# Patient Record
Sex: Female | Born: 1940 | Race: Black or African American | Hispanic: No | Smoking: Never smoker
Health system: Southern US, Community
[De-identification: ages and names within clinical notes are randomized; demographics above are authoritative.]

## PROBLEM LIST (undated history)

## (undated) DIAGNOSIS — F0280 Dementia in other diseases classified elsewhere without behavioral disturbance: Secondary | ICD-10-CM

## (undated) DIAGNOSIS — G4733 Obstructive sleep apnea (adult) (pediatric): Secondary | ICD-10-CM

## (undated) DIAGNOSIS — N950 Postmenopausal bleeding: Secondary | ICD-10-CM

## (undated) DIAGNOSIS — R32 Unspecified urinary incontinence: Secondary | ICD-10-CM

## (undated) DIAGNOSIS — I10 Essential (primary) hypertension: Secondary | ICD-10-CM

## (undated) DIAGNOSIS — H409 Unspecified glaucoma: Secondary | ICD-10-CM

## (undated) DIAGNOSIS — K219 Gastro-esophageal reflux disease without esophagitis: Secondary | ICD-10-CM

## (undated) DIAGNOSIS — R279 Unspecified lack of coordination: Secondary | ICD-10-CM

## (undated) DIAGNOSIS — I2510 Atherosclerotic heart disease of native coronary artery without angina pectoris: Secondary | ICD-10-CM

## (undated) DIAGNOSIS — I219 Acute myocardial infarction, unspecified: Secondary | ICD-10-CM

## (undated) DIAGNOSIS — F068 Other specified mental disorders due to known physiological condition: Secondary | ICD-10-CM

## (undated) DIAGNOSIS — M109 Gout, unspecified: Secondary | ICD-10-CM

## (undated) DIAGNOSIS — I729 Aneurysm of unspecified site: Secondary | ICD-10-CM

## (undated) DIAGNOSIS — G309 Alzheimer's disease, unspecified: Secondary | ICD-10-CM

## (undated) DIAGNOSIS — R609 Edema, unspecified: Secondary | ICD-10-CM

## (undated) DIAGNOSIS — E669 Obesity, unspecified: Secondary | ICD-10-CM

## (undated) DIAGNOSIS — I38 Endocarditis, valve unspecified: Secondary | ICD-10-CM

## (undated) DIAGNOSIS — I252 Old myocardial infarction: Secondary | ICD-10-CM

## (undated) DIAGNOSIS — I6350 Cerebral infarction due to unspecified occlusion or stenosis of unspecified cerebral artery: Secondary | ICD-10-CM

## (undated) DIAGNOSIS — E785 Hyperlipidemia, unspecified: Secondary | ICD-10-CM

## (undated) DIAGNOSIS — M1710 Unilateral primary osteoarthritis, unspecified knee: Secondary | ICD-10-CM

## (undated) HISTORY — PX: TUBAL LIGATION: SHX77

## (undated) HISTORY — DX: Atherosclerotic heart disease of native coronary artery without angina pectoris: I25.10

## (undated) HISTORY — DX: Unilateral primary osteoarthritis, unspecified knee: M17.10

## (undated) HISTORY — DX: Unspecified lack of coordination: R27.9

## (undated) HISTORY — DX: Unspecified urinary incontinence: R32

## (undated) HISTORY — DX: Dementia in other diseases classified elsewhere, unspecified severity, without behavioral disturbance, psychotic disturbance, mood disturbance, and anxiety: F02.80

## (undated) HISTORY — DX: Essential (primary) hypertension: I10

## (undated) HISTORY — DX: Acute myocardial infarction, unspecified: I21.9

## (undated) HISTORY — DX: Gout, unspecified: M10.9

## (undated) HISTORY — DX: Hyperlipidemia, unspecified: E78.5

## (undated) HISTORY — DX: Aneurysm of unspecified site: I72.9

## (undated) HISTORY — DX: Other specified mental disorders due to known physiological condition: F06.8

## (undated) HISTORY — DX: Alzheimer's disease, unspecified: G30.9

## (undated) HISTORY — DX: Morbid (severe) obesity due to excess calories: E66.01

## (undated) HISTORY — DX: Obstructive sleep apnea (adult) (pediatric): G47.33

## (undated) HISTORY — DX: Gastro-esophageal reflux disease without esophagitis: K21.9

## (undated) HISTORY — DX: Unspecified glaucoma: H40.9

## (undated) HISTORY — DX: Cerebral infarction due to unspecified occlusion or stenosis of unspecified cerebral artery: I63.50

## (undated) HISTORY — DX: Obesity, unspecified: E66.9

## (undated) HISTORY — DX: Edema, unspecified: R60.9

## (undated) HISTORY — DX: Endocarditis, valve unspecified: I38

## (undated) HISTORY — DX: Postmenopausal bleeding: N95.0

## (undated) HISTORY — DX: Old myocardial infarction: I25.2

---

## 1998-03-27 ENCOUNTER — Encounter: Admission: RE | Admit: 1998-03-27 | Discharge: 1998-06-25 | Payer: Self-pay | Admitting: Family Medicine

## 1999-08-20 ENCOUNTER — Ambulatory Visit (HOSPITAL_COMMUNITY): Admission: RE | Admit: 1999-08-20 | Discharge: 1999-08-20 | Payer: Self-pay | Admitting: Cardiology

## 1999-08-21 ENCOUNTER — Encounter: Payer: Self-pay | Admitting: Cardiology

## 2000-04-09 ENCOUNTER — Ambulatory Visit (HOSPITAL_COMMUNITY): Admission: RE | Admit: 2000-04-09 | Discharge: 2000-04-09 | Payer: Self-pay | Admitting: Family Medicine

## 2000-04-09 ENCOUNTER — Encounter: Payer: Self-pay | Admitting: Family Medicine

## 2001-01-03 ENCOUNTER — Other Ambulatory Visit: Admission: RE | Admit: 2001-01-03 | Discharge: 2001-01-03 | Payer: Self-pay | Admitting: *Deleted

## 2001-04-05 ENCOUNTER — Encounter: Admission: RE | Admit: 2001-04-05 | Discharge: 2001-04-05 | Payer: Self-pay | Admitting: Obstetrics & Gynecology

## 2001-04-05 ENCOUNTER — Other Ambulatory Visit: Admission: RE | Admit: 2001-04-05 | Discharge: 2001-04-05 | Payer: Self-pay | Admitting: Obstetrics & Gynecology

## 2001-05-03 ENCOUNTER — Other Ambulatory Visit: Admission: RE | Admit: 2001-05-03 | Discharge: 2001-05-03 | Payer: Self-pay | Admitting: *Deleted

## 2001-05-03 ENCOUNTER — Encounter: Admission: RE | Admit: 2001-05-03 | Discharge: 2001-05-03 | Payer: Self-pay | Admitting: Obstetrics & Gynecology

## 2001-06-28 ENCOUNTER — Encounter: Admission: RE | Admit: 2001-06-28 | Discharge: 2001-06-28 | Payer: Self-pay | Admitting: Obstetrics & Gynecology

## 2001-12-26 ENCOUNTER — Emergency Department (HOSPITAL_COMMUNITY): Admission: EM | Admit: 2001-12-26 | Discharge: 2001-12-26 | Payer: Self-pay | Admitting: *Deleted

## 2002-01-01 ENCOUNTER — Emergency Department (HOSPITAL_COMMUNITY): Admission: EM | Admit: 2002-01-01 | Discharge: 2002-01-01 | Payer: Self-pay | Admitting: Emergency Medicine

## 2002-01-12 ENCOUNTER — Emergency Department (HOSPITAL_COMMUNITY): Admission: EM | Admit: 2002-01-12 | Discharge: 2002-01-12 | Payer: Self-pay | Admitting: Emergency Medicine

## 2002-01-20 ENCOUNTER — Encounter: Payer: Self-pay | Admitting: Emergency Medicine

## 2002-01-20 ENCOUNTER — Emergency Department (HOSPITAL_COMMUNITY): Admission: EM | Admit: 2002-01-20 | Discharge: 2002-01-20 | Payer: Self-pay | Admitting: Emergency Medicine

## 2002-01-20 ENCOUNTER — Inpatient Hospital Stay (HOSPITAL_COMMUNITY): Admission: AD | Admit: 2002-01-20 | Discharge: 2002-01-25 | Payer: Self-pay | Admitting: Neurology

## 2002-02-08 ENCOUNTER — Emergency Department (HOSPITAL_COMMUNITY): Admission: EM | Admit: 2002-02-08 | Discharge: 2002-02-09 | Payer: Self-pay | Admitting: Emergency Medicine

## 2002-02-09 ENCOUNTER — Encounter: Payer: Self-pay | Admitting: *Deleted

## 2002-03-01 ENCOUNTER — Ambulatory Visit (HOSPITAL_COMMUNITY): Admission: RE | Admit: 2002-03-01 | Discharge: 2002-03-01 | Payer: Self-pay | Admitting: Neurology

## 2002-03-01 ENCOUNTER — Encounter: Payer: Self-pay | Admitting: Neurology

## 2002-08-24 ENCOUNTER — Encounter: Admission: RE | Admit: 2002-08-24 | Discharge: 2002-08-24 | Payer: Self-pay | Admitting: Internal Medicine

## 2002-10-02 ENCOUNTER — Other Ambulatory Visit: Admission: RE | Admit: 2002-10-02 | Discharge: 2002-10-02 | Payer: Self-pay | Admitting: Internal Medicine

## 2002-10-02 ENCOUNTER — Encounter: Admission: RE | Admit: 2002-10-02 | Discharge: 2002-10-02 | Payer: Self-pay | Admitting: Internal Medicine

## 2002-10-20 ENCOUNTER — Encounter: Admission: RE | Admit: 2002-10-20 | Discharge: 2002-10-20 | Payer: Self-pay | Admitting: Internal Medicine

## 2002-10-27 ENCOUNTER — Encounter: Admission: RE | Admit: 2002-10-27 | Discharge: 2002-10-27 | Payer: Self-pay | Admitting: Internal Medicine

## 2002-10-27 ENCOUNTER — Encounter: Payer: Self-pay | Admitting: Internal Medicine

## 2002-10-27 ENCOUNTER — Inpatient Hospital Stay (HOSPITAL_COMMUNITY): Admission: EM | Admit: 2002-10-27 | Discharge: 2002-10-28 | Payer: Self-pay | Admitting: Emergency Medicine

## 2002-10-27 ENCOUNTER — Ambulatory Visit (HOSPITAL_COMMUNITY): Admission: RE | Admit: 2002-10-27 | Discharge: 2002-10-27 | Payer: Self-pay | Admitting: Internal Medicine

## 2002-10-27 ENCOUNTER — Encounter: Payer: Self-pay | Admitting: Emergency Medicine

## 2002-11-14 ENCOUNTER — Encounter: Admission: RE | Admit: 2002-11-14 | Discharge: 2002-11-14 | Payer: Self-pay | Admitting: Internal Medicine

## 2002-12-17 ENCOUNTER — Emergency Department (HOSPITAL_COMMUNITY): Admission: EM | Admit: 2002-12-17 | Discharge: 2002-12-17 | Payer: Self-pay | Admitting: Emergency Medicine

## 2002-12-17 ENCOUNTER — Encounter: Payer: Self-pay | Admitting: Emergency Medicine

## 2003-03-02 ENCOUNTER — Encounter: Admission: RE | Admit: 2003-03-02 | Discharge: 2003-03-02 | Payer: Self-pay | Admitting: Internal Medicine

## 2003-04-19 ENCOUNTER — Emergency Department (HOSPITAL_COMMUNITY): Admission: EM | Admit: 2003-04-19 | Discharge: 2003-04-19 | Payer: Self-pay | Admitting: Emergency Medicine

## 2003-07-05 ENCOUNTER — Encounter: Admission: RE | Admit: 2003-07-05 | Discharge: 2003-07-05 | Payer: Self-pay | Admitting: Internal Medicine

## 2003-10-02 ENCOUNTER — Encounter: Admission: RE | Admit: 2003-10-02 | Discharge: 2003-10-02 | Payer: Self-pay | Admitting: Internal Medicine

## 2003-10-16 ENCOUNTER — Encounter: Admission: RE | Admit: 2003-10-16 | Discharge: 2003-10-16 | Payer: Self-pay | Admitting: Internal Medicine

## 2004-03-24 ENCOUNTER — Encounter: Admission: RE | Admit: 2004-03-24 | Discharge: 2004-03-24 | Payer: Self-pay | Admitting: Internal Medicine

## 2004-05-26 ENCOUNTER — Encounter: Admission: RE | Admit: 2004-05-26 | Discharge: 2004-05-26 | Payer: Self-pay | Admitting: Internal Medicine

## 2004-07-24 ENCOUNTER — Encounter: Admission: RE | Admit: 2004-07-24 | Discharge: 2004-07-24 | Payer: Self-pay | Admitting: Internal Medicine

## 2004-07-24 ENCOUNTER — Encounter (INDEPENDENT_AMBULATORY_CARE_PROVIDER_SITE_OTHER): Payer: Self-pay | Admitting: *Deleted

## 2004-07-31 ENCOUNTER — Encounter: Admission: RE | Admit: 2004-07-31 | Discharge: 2004-07-31 | Payer: Self-pay | Admitting: Internal Medicine

## 2004-08-07 ENCOUNTER — Ambulatory Visit: Payer: Self-pay | Admitting: Internal Medicine

## 2004-09-10 ENCOUNTER — Ambulatory Visit: Payer: Self-pay | Admitting: Internal Medicine

## 2004-09-17 ENCOUNTER — Ambulatory Visit: Payer: Self-pay | Admitting: Internal Medicine

## 2004-10-14 ENCOUNTER — Ambulatory Visit: Payer: Self-pay | Admitting: Internal Medicine

## 2004-11-04 ENCOUNTER — Ambulatory Visit: Payer: Self-pay | Admitting: Internal Medicine

## 2004-11-06 ENCOUNTER — Ambulatory Visit (HOSPITAL_COMMUNITY): Admission: RE | Admit: 2004-11-06 | Discharge: 2004-11-06 | Payer: Self-pay | Admitting: Internal Medicine

## 2004-11-11 ENCOUNTER — Ambulatory Visit: Payer: Self-pay | Admitting: Internal Medicine

## 2004-12-15 ENCOUNTER — Ambulatory Visit: Payer: Self-pay | Admitting: Internal Medicine

## 2005-02-23 ENCOUNTER — Ambulatory Visit: Payer: Self-pay | Admitting: Internal Medicine

## 2005-03-04 ENCOUNTER — Emergency Department (HOSPITAL_COMMUNITY): Admission: EM | Admit: 2005-03-04 | Discharge: 2005-03-04 | Payer: Self-pay | Admitting: Emergency Medicine

## 2005-03-05 ENCOUNTER — Ambulatory Visit: Payer: Self-pay | Admitting: Internal Medicine

## 2005-03-31 ENCOUNTER — Emergency Department (HOSPITAL_COMMUNITY): Admission: EM | Admit: 2005-03-31 | Discharge: 2005-03-31 | Payer: Self-pay | Admitting: Family Medicine

## 2005-04-01 ENCOUNTER — Emergency Department (HOSPITAL_COMMUNITY): Admission: EM | Admit: 2005-04-01 | Discharge: 2005-04-01 | Payer: Self-pay | Admitting: Emergency Medicine

## 2005-04-03 ENCOUNTER — Ambulatory Visit: Payer: Self-pay | Admitting: Internal Medicine

## 2005-04-20 ENCOUNTER — Ambulatory Visit: Payer: Self-pay | Admitting: Internal Medicine

## 2005-05-05 ENCOUNTER — Ambulatory Visit: Payer: Self-pay | Admitting: Internal Medicine

## 2005-05-12 ENCOUNTER — Ambulatory Visit: Payer: Self-pay | Admitting: Internal Medicine

## 2005-05-26 ENCOUNTER — Ambulatory Visit (HOSPITAL_COMMUNITY): Admission: RE | Admit: 2005-05-26 | Discharge: 2005-05-26 | Payer: Self-pay | Admitting: Internal Medicine

## 2005-05-26 ENCOUNTER — Encounter (INDEPENDENT_AMBULATORY_CARE_PROVIDER_SITE_OTHER): Payer: Self-pay | Admitting: Cardiology

## 2005-10-01 ENCOUNTER — Ambulatory Visit: Payer: Self-pay | Admitting: Internal Medicine

## 2005-10-01 ENCOUNTER — Encounter (INDEPENDENT_AMBULATORY_CARE_PROVIDER_SITE_OTHER): Payer: Self-pay | Admitting: *Deleted

## 2005-10-15 ENCOUNTER — Ambulatory Visit: Payer: Self-pay | Admitting: Internal Medicine

## 2005-10-28 ENCOUNTER — Ambulatory Visit: Payer: Self-pay | Admitting: Internal Medicine

## 2005-11-05 ENCOUNTER — Ambulatory Visit: Payer: Self-pay | Admitting: Internal Medicine

## 2006-01-22 ENCOUNTER — Ambulatory Visit: Payer: Self-pay | Admitting: Internal Medicine

## 2006-04-12 ENCOUNTER — Ambulatory Visit: Payer: Self-pay | Admitting: Internal Medicine

## 2006-05-04 ENCOUNTER — Ambulatory Visit: Payer: Self-pay | Admitting: Internal Medicine

## 2006-05-24 ENCOUNTER — Ambulatory Visit: Payer: Self-pay | Admitting: Internal Medicine

## 2006-07-09 ENCOUNTER — Ambulatory Visit: Payer: Self-pay | Admitting: Internal Medicine

## 2006-08-10 ENCOUNTER — Ambulatory Visit: Payer: Self-pay | Admitting: Internal Medicine

## 2006-09-23 ENCOUNTER — Ambulatory Visit: Payer: Self-pay | Admitting: Internal Medicine

## 2006-10-26 ENCOUNTER — Ambulatory Visit (HOSPITAL_BASED_OUTPATIENT_CLINIC_OR_DEPARTMENT_OTHER): Admission: RE | Admit: 2006-10-26 | Discharge: 2006-10-26 | Payer: Self-pay | Admitting: Internal Medicine

## 2006-10-30 ENCOUNTER — Ambulatory Visit: Payer: Self-pay | Admitting: Pulmonary Disease

## 2006-11-16 ENCOUNTER — Ambulatory Visit: Payer: Self-pay | Admitting: Internal Medicine

## 2006-12-03 ENCOUNTER — Ambulatory Visit: Payer: Self-pay | Admitting: Internal Medicine

## 2006-12-22 ENCOUNTER — Ambulatory Visit: Payer: Self-pay | Admitting: Internal Medicine

## 2007-01-12 ENCOUNTER — Ambulatory Visit: Payer: Self-pay | Admitting: Internal Medicine

## 2007-02-14 ENCOUNTER — Ambulatory Visit: Payer: Self-pay | Admitting: Internal Medicine

## 2007-02-14 DIAGNOSIS — E785 Hyperlipidemia, unspecified: Secondary | ICD-10-CM

## 2007-02-14 DIAGNOSIS — I252 Old myocardial infarction: Secondary | ICD-10-CM

## 2007-02-14 DIAGNOSIS — I10 Essential (primary) hypertension: Secondary | ICD-10-CM

## 2007-02-14 DIAGNOSIS — I2510 Atherosclerotic heart disease of native coronary artery without angina pectoris: Secondary | ICD-10-CM

## 2007-02-14 HISTORY — DX: Atherosclerotic heart disease of native coronary artery without angina pectoris: I25.10

## 2007-02-14 HISTORY — DX: Old myocardial infarction: I25.2

## 2007-02-14 HISTORY — DX: Hyperlipidemia, unspecified: E78.5

## 2007-02-14 HISTORY — DX: Essential (primary) hypertension: I10

## 2007-02-14 LAB — CONVERTED CEMR LAB
ALT: 15 units/L (ref 0–40)
AST: 19 units/L (ref 0–37)
Albumin: 3.7 g/dL (ref 3.5–5.2)
Alkaline Phosphatase: 74 units/L (ref 39–117)
BUN: 6 mg/dL (ref 6–23)
Bilirubin, Direct: 0.1 mg/dL (ref 0.0–0.3)
CO2: 34 meq/L — ABNORMAL HIGH (ref 19–32)
Calcium: 9.3 mg/dL (ref 8.4–10.5)
Chloride: 101 meq/L (ref 96–112)
Cholesterol: 240 mg/dL (ref 0–200)
Creatinine, Ser: 0.9 mg/dL (ref 0.4–1.2)
Direct LDL: 177.5 mg/dL
GFR calc Af Amer: 81 mL/min
GFR calc non Af Amer: 67 mL/min
Glucose, Bld: 95 mg/dL (ref 70–99)
HDL: 46.2 mg/dL (ref >39.0)
Potassium: 4.1 meq/L (ref 3.5–5.1)
Sodium: 144 meq/L (ref 135–145)
Total Bilirubin: 0.7 mg/dL (ref 0.3–1.2)
Total CHOL/HDL Ratio: 5.2
Total Protein: 7.4 g/dL (ref 6.0–8.3)
Triglycerides: 80 mg/dL (ref 0–149)
VLDL: 16 mg/dL (ref 0–40)

## 2007-02-22 ENCOUNTER — Ambulatory Visit: Payer: Self-pay | Admitting: Internal Medicine

## 2007-03-14 ENCOUNTER — Ambulatory Visit: Payer: Self-pay | Admitting: Internal Medicine

## 2007-03-17 ENCOUNTER — Emergency Department (HOSPITAL_COMMUNITY): Admission: EM | Admit: 2007-03-17 | Discharge: 2007-03-18 | Payer: Self-pay | Admitting: Emergency Medicine

## 2007-03-29 ENCOUNTER — Ambulatory Visit: Payer: Self-pay | Admitting: Internal Medicine

## 2007-03-29 LAB — CONVERTED CEMR LAB
ALT: 13 units/L (ref 0–40)
AST: 15 units/L (ref 0–37)
Albumin: 3.6 g/dL (ref 3.5–5.2)
Alkaline Phosphatase: 67 units/L (ref 39–117)
Bilirubin, Direct: 0.1 mg/dL (ref 0.0–0.3)
Cholesterol: 189 mg/dL (ref 0–200)
HDL: 40.3 mg/dL (ref >39.0)
LDL Cholesterol: 118 mg/dL — ABNORMAL HIGH (ref 0–99)
Total Bilirubin: 0.7 mg/dL (ref 0.3–1.2)
Total CHOL/HDL Ratio: 4.7
Total Protein: 6.7 g/dL (ref 6.0–8.3)
Triglycerides: 153 mg/dL — ABNORMAL HIGH (ref 0–149)
VLDL: 31 mg/dL (ref 0–40)

## 2007-04-28 ENCOUNTER — Ambulatory Visit: Payer: Self-pay | Admitting: Internal Medicine

## 2007-04-28 LAB — CONVERTED CEMR LAB
ALT: 15 units/L (ref 0–40)
AST: 17 units/L (ref 0–37)
Albumin: 3.5 g/dL (ref 3.5–5.2)
Alkaline Phosphatase: 69 units/L (ref 39–117)
BUN: 15 mg/dL (ref 6–23)
Bilirubin, Direct: 0.1 mg/dL (ref 0.0–0.3)
CO2: 33 meq/L — ABNORMAL HIGH (ref 19–32)
Calcium: 9.4 mg/dL (ref 8.4–10.5)
Chloride: 107 meq/L (ref 96–112)
Cholesterol: 204 mg/dL (ref 0–200)
Creatinine, Ser: 1 mg/dL (ref 0.4–1.2)
Direct LDL: 139.6 mg/dL
GFR calc Af Amer: 72 mL/min
GFR calc non Af Amer: 59 mL/min
Glucose, Bld: 95 mg/dL (ref 70–99)
HDL: 41.5 mg/dL (ref >39.0)
Potassium: 3.7 meq/L (ref 3.5–5.1)
Sodium: 146 meq/L — ABNORMAL HIGH (ref 135–145)
Total Bilirubin: 0.5 mg/dL (ref 0.3–1.2)
Total CHOL/HDL Ratio: 4.9
Total Protein: 7.2 g/dL (ref 6.0–8.3)
Triglycerides: 101 mg/dL (ref 0–149)
VLDL: 20 mg/dL (ref 0–40)

## 2007-05-18 ENCOUNTER — Encounter: Admission: RE | Admit: 2007-05-18 | Discharge: 2007-08-16 | Payer: Self-pay | Admitting: Internal Medicine

## 2007-05-26 ENCOUNTER — Ambulatory Visit: Payer: Self-pay | Admitting: Internal Medicine

## 2007-05-27 DIAGNOSIS — G4733 Obstructive sleep apnea (adult) (pediatric): Secondary | ICD-10-CM

## 2007-05-27 HISTORY — DX: Obstructive sleep apnea (adult) (pediatric): G47.33

## 2007-07-07 ENCOUNTER — Ambulatory Visit: Payer: Self-pay | Admitting: Internal Medicine

## 2007-07-07 LAB — CONVERTED CEMR LAB
Cholesterol, target level: 200 mg/dL
HDL goal, serum: 40 mg/dL
LDL Goal: 100 mg/dL

## 2007-07-08 LAB — CONVERTED CEMR LAB
ALT: 16 units/L (ref 0–35)
AST: 17 units/L (ref 0–37)
Albumin: 3.7 g/dL (ref 3.5–5.2)
Alkaline Phosphatase: 71 units/L (ref 39–117)
BUN: 9 mg/dL (ref 6–23)
Basophils Absolute: 0.1 10*3/uL (ref 0.0–0.1)
Basophils Relative: 0.9 % (ref 0.0–1.0)
Bilirubin, Direct: 0.1 mg/dL (ref 0.0–0.3)
CO2: 32 meq/L (ref 19–32)
Calcium: 9.6 mg/dL (ref 8.4–10.5)
Chloride: 108 meq/L (ref 96–112)
Cholesterol: 162 mg/dL (ref 0–200)
Creatinine, Ser: 0.9 mg/dL (ref 0.4–1.2)
Eosinophils Absolute: 0.2 10*3/uL (ref 0.0–0.6)
Eosinophils Relative: 3.3 % (ref 0.0–5.0)
GFR calc Af Amer: 81 mL/min
GFR calc non Af Amer: 67 mL/min
Glucose, Bld: 115 mg/dL — ABNORMAL HIGH (ref 70–99)
HCT: 39 % (ref 36.0–46.0)
HDL: 36.7 mg/dL — ABNORMAL LOW (ref >39.0)
Hemoglobin: 13.6 g/dL (ref 12.0–15.0)
LDL Cholesterol: 113 mg/dL — ABNORMAL HIGH (ref 0–99)
Lymphocytes Relative: 34 % (ref 12.0–46.0)
MCHC: 34.7 g/dL (ref 30.0–36.0)
MCV: 85.1 fL (ref 78.0–100.0)
Monocytes Absolute: 0.5 10*3/uL (ref 0.2–0.7)
Monocytes Relative: 8 % (ref 3.0–11.0)
Neutro Abs: 3.1 10*3/uL (ref 1.4–7.7)
Neutrophils Relative %: 53.8 % (ref 43.0–77.0)
Platelets: 217 10*3/uL (ref 150–400)
Potassium: 4.1 meq/L (ref 3.5–5.1)
RBC: 4.59 M/uL (ref 3.87–5.11)
RDW: 14 % (ref 11.5–14.6)
Sodium: 146 meq/L — ABNORMAL HIGH (ref 135–145)
Total Bilirubin: 0.5 mg/dL (ref 0.3–1.2)
Total CHOL/HDL Ratio: 4.4
Total Protein: 7 g/dL (ref 6.0–8.3)
Triglycerides: 63 mg/dL (ref 0–149)
VLDL: 13 mg/dL (ref 0–40)
WBC: 5.9 10*3/uL (ref 4.5–10.5)

## 2007-08-11 ENCOUNTER — Ambulatory Visit: Payer: Self-pay | Admitting: Internal Medicine

## 2007-08-25 ENCOUNTER — Encounter: Admission: RE | Admit: 2007-08-25 | Discharge: 2007-08-25 | Payer: Self-pay | Admitting: Ophthalmology

## 2007-09-09 ENCOUNTER — Encounter: Payer: Self-pay | Admitting: Internal Medicine

## 2007-09-23 ENCOUNTER — Ambulatory Visit: Payer: Self-pay | Admitting: Internal Medicine

## 2007-09-26 LAB — CONVERTED CEMR LAB
ALT: 17 units/L (ref 0–35)
AST: 19 units/L (ref 0–37)
Albumin: 3.8 g/dL (ref 3.5–5.2)
Alkaline Phosphatase: 80 units/L (ref 39–117)
BUN: 10 mg/dL (ref 6–23)
Bilirubin, Direct: 0.3 mg/dL (ref 0.0–0.3)
CO2: 34 meq/L — ABNORMAL HIGH (ref 19–32)
Calcium: 10.1 mg/dL (ref 8.4–10.5)
Chloride: 109 meq/L (ref 96–112)
Cholesterol: 181 mg/dL (ref 0–200)
Creatinine, Ser: 0.9 mg/dL (ref 0.4–1.2)
GFR calc Af Amer: 81 mL/min
GFR calc non Af Amer: 67 mL/min
Glucose, Bld: 83 mg/dL (ref 70–99)
HDL: 37.5 mg/dL — ABNORMAL LOW (ref >39.0)
LDL Cholesterol: 113 mg/dL — ABNORMAL HIGH (ref 0–99)
Potassium: 4.7 meq/L (ref 3.5–5.1)
Sodium: 150 meq/L — ABNORMAL HIGH (ref 135–145)
Total Bilirubin: 0.8 mg/dL (ref 0.3–1.2)
Total CHOL/HDL Ratio: 4.8
Total Protein: 6.9 g/dL (ref 6.0–8.3)
Triglycerides: 153 mg/dL — ABNORMAL HIGH (ref 0–149)
VLDL: 31 mg/dL (ref 0–40)

## 2007-09-29 ENCOUNTER — Ambulatory Visit (HOSPITAL_COMMUNITY): Admission: RE | Admit: 2007-09-29 | Discharge: 2007-09-29 | Payer: Self-pay | Admitting: Interventional Radiology

## 2007-09-30 ENCOUNTER — Telehealth (INDEPENDENT_AMBULATORY_CARE_PROVIDER_SITE_OTHER): Payer: Self-pay | Admitting: *Deleted

## 2007-10-04 ENCOUNTER — Ambulatory Visit: Payer: Self-pay | Admitting: Internal Medicine

## 2007-11-04 ENCOUNTER — Ambulatory Visit: Payer: Self-pay | Admitting: Internal Medicine

## 2007-12-12 ENCOUNTER — Ambulatory Visit: Payer: Self-pay | Admitting: Internal Medicine

## 2007-12-14 ENCOUNTER — Telehealth: Payer: Self-pay | Admitting: Internal Medicine

## 2007-12-15 ENCOUNTER — Ambulatory Visit: Payer: Self-pay | Admitting: Internal Medicine

## 2007-12-19 ENCOUNTER — Telehealth: Payer: Self-pay | Admitting: Internal Medicine

## 2008-01-16 ENCOUNTER — Ambulatory Visit: Payer: Self-pay | Admitting: Internal Medicine

## 2008-02-01 ENCOUNTER — Inpatient Hospital Stay (HOSPITAL_COMMUNITY): Admission: EM | Admit: 2008-02-01 | Discharge: 2008-02-03 | Payer: Self-pay | Admitting: Emergency Medicine

## 2008-02-02 ENCOUNTER — Ambulatory Visit: Payer: Self-pay | Admitting: Internal Medicine

## 2008-02-09 ENCOUNTER — Other Ambulatory Visit: Admission: RE | Admit: 2008-02-09 | Discharge: 2008-02-09 | Payer: Self-pay | Admitting: Obstetrics & Gynecology

## 2008-02-09 ENCOUNTER — Telehealth: Payer: Self-pay | Admitting: Internal Medicine

## 2008-02-09 ENCOUNTER — Encounter: Payer: Self-pay | Admitting: Internal Medicine

## 2008-02-10 ENCOUNTER — Encounter: Payer: Self-pay | Admitting: Internal Medicine

## 2008-02-10 ENCOUNTER — Ambulatory Visit: Payer: Self-pay | Admitting: Internal Medicine

## 2008-02-10 DIAGNOSIS — I6350 Cerebral infarction due to unspecified occlusion or stenosis of unspecified cerebral artery: Secondary | ICD-10-CM

## 2008-02-10 DIAGNOSIS — Z8673 Personal history of transient ischemic attack (TIA), and cerebral infarction without residual deficits: Secondary | ICD-10-CM

## 2008-02-10 HISTORY — DX: Cerebral infarction due to unspecified occlusion or stenosis of unspecified cerebral artery: I63.50

## 2008-02-13 ENCOUNTER — Encounter: Payer: Self-pay | Admitting: Internal Medicine

## 2008-02-28 ENCOUNTER — Encounter: Payer: Self-pay | Admitting: Internal Medicine

## 2008-03-02 ENCOUNTER — Telehealth: Payer: Self-pay | Admitting: Internal Medicine

## 2008-03-02 ENCOUNTER — Ambulatory Visit: Payer: Self-pay | Admitting: Internal Medicine

## 2008-03-09 ENCOUNTER — Ambulatory Visit: Payer: Self-pay | Admitting: Internal Medicine

## 2008-04-03 ENCOUNTER — Ambulatory Visit: Payer: Self-pay | Admitting: Internal Medicine

## 2008-04-11 ENCOUNTER — Ambulatory Visit: Payer: Self-pay | Admitting: Internal Medicine

## 2008-05-10 ENCOUNTER — Ambulatory Visit: Payer: Self-pay | Admitting: Internal Medicine

## 2008-05-11 ENCOUNTER — Telehealth: Payer: Self-pay | Admitting: Internal Medicine

## 2008-05-11 LAB — CONVERTED CEMR LAB
ALT: 14 units/L (ref 0–35)
AST: 17 units/L (ref 0–37)
Alkaline Phosphatase: 70 units/L (ref 39–117)
BUN: 11 mg/dL (ref 6–23)
Bilirubin, Direct: 0.1 mg/dL (ref 0.0–0.3)
CO2: 32 meq/L (ref 19–32)
Chloride: 101 meq/L (ref 96–112)
Cholesterol: 140 mg/dL (ref 0–200)
Glucose, Bld: 104 mg/dL — ABNORMAL HIGH (ref 70–99)
Potassium: 3.3 meq/L — ABNORMAL LOW (ref 3.5–5.1)
Sodium: 142 meq/L (ref 135–145)
Total Protein: 7.4 g/dL (ref 6.0–8.3)

## 2008-07-04 ENCOUNTER — Encounter: Payer: Self-pay | Admitting: Internal Medicine

## 2008-07-10 ENCOUNTER — Ambulatory Visit: Payer: Self-pay | Admitting: Internal Medicine

## 2008-07-19 ENCOUNTER — Encounter: Payer: Self-pay | Admitting: Internal Medicine

## 2008-09-10 ENCOUNTER — Ambulatory Visit: Payer: Self-pay | Admitting: Internal Medicine

## 2008-10-17 ENCOUNTER — Telehealth: Payer: Self-pay | Admitting: Internal Medicine

## 2008-11-07 ENCOUNTER — Ambulatory Visit: Payer: Self-pay | Admitting: Internal Medicine

## 2008-11-08 ENCOUNTER — Telehealth: Payer: Self-pay | Admitting: Internal Medicine

## 2008-11-15 LAB — CONVERTED CEMR LAB
BUN: 8 mg/dL (ref 6–23)
Calcium: 9.4 mg/dL (ref 8.4–10.5)
Chloride: 107 meq/L (ref 96–112)
Creatinine, Ser: 0.8 mg/dL (ref 0.4–1.2)
GFR calc Af Amer: 93 mL/min
GFR calc non Af Amer: 77 mL/min

## 2008-12-13 ENCOUNTER — Telehealth: Payer: Self-pay | Admitting: Internal Medicine

## 2008-12-20 ENCOUNTER — Telehealth: Payer: Self-pay | Admitting: Internal Medicine

## 2009-01-16 ENCOUNTER — Ambulatory Visit: Payer: Self-pay | Admitting: Internal Medicine

## 2009-01-16 DIAGNOSIS — E669 Obesity, unspecified: Secondary | ICD-10-CM | POA: Insufficient documentation

## 2009-01-16 HISTORY — DX: Obesity, unspecified: E66.9

## 2009-01-21 ENCOUNTER — Telehealth (INDEPENDENT_AMBULATORY_CARE_PROVIDER_SITE_OTHER): Payer: Self-pay | Admitting: *Deleted

## 2009-01-21 LAB — CONVERTED CEMR LAB
ALT: 13 units/L (ref 0–35)
AST: 18 units/L (ref 0–37)
Albumin: 3.9 g/dL (ref 3.5–5.2)
Alkaline Phosphatase: 69 units/L (ref 39–117)
BUN: 9 mg/dL (ref 6–23)
Bilirubin, Direct: 0.1 mg/dL (ref 0.0–0.3)
CO2: 34 meq/L — ABNORMAL HIGH (ref 19–32)
Calcium: 9.5 mg/dL (ref 8.4–10.5)
Chloride: 109 meq/L (ref 96–112)
Cholesterol: 137 mg/dL (ref 0–200)
Creatinine, Ser: 0.8 mg/dL (ref 0.4–1.2)
GFR calc Af Amer: 93 mL/min
GFR calc non Af Amer: 77 mL/min
Glucose, Bld: 92 mg/dL (ref 70–99)
HDL: 46.6 mg/dL (ref >39.0)
LDL Cholesterol: 73 mg/dL (ref 0–99)
Potassium: 3.9 meq/L (ref 3.5–5.1)
Sodium: 147 meq/L — ABNORMAL HIGH (ref 135–145)
Total Bilirubin: 0.8 mg/dL (ref 0.3–1.2)
Total CHOL/HDL Ratio: 2.9
Total Protein: 7.5 g/dL (ref 6.0–8.3)
Triglycerides: 86 mg/dL (ref 0–149)
VLDL: 17 mg/dL (ref 0–40)

## 2009-02-26 ENCOUNTER — Encounter: Admission: RE | Admit: 2009-02-26 | Discharge: 2009-02-26 | Payer: Self-pay | Admitting: Internal Medicine

## 2009-02-26 ENCOUNTER — Encounter: Payer: Self-pay | Admitting: Internal Medicine

## 2009-03-15 ENCOUNTER — Ambulatory Visit: Payer: Self-pay | Admitting: Internal Medicine

## 2009-03-21 ENCOUNTER — Ambulatory Visit: Payer: Self-pay | Admitting: Family Medicine

## 2009-03-22 LAB — CONVERTED CEMR LAB
BUN: 13 mg/dL (ref 6–23)
Basophils Absolute: 0 10*3/uL (ref 0.0–0.1)
Creatinine, Ser: 0.8 mg/dL (ref 0.4–1.2)
Eosinophils Absolute: 0.2 10*3/uL (ref 0.0–0.7)
GFR calc non Af Amer: 92.26 mL/min (ref >60)
HCT: 39 % (ref 36.0–46.0)
Hemoglobin: 13 g/dL (ref 12.0–15.0)
Lymphs Abs: 1.6 10*3/uL (ref 0.7–4.0)
MCHC: 33.4 g/dL (ref 30.0–36.0)
Neutro Abs: 3 10*3/uL (ref 1.4–7.7)
RDW: 13.4 % (ref 11.5–14.6)

## 2009-03-26 ENCOUNTER — Telehealth: Payer: Self-pay | Admitting: Family Medicine

## 2009-03-28 ENCOUNTER — Telehealth: Payer: Self-pay | Admitting: Internal Medicine

## 2009-04-30 ENCOUNTER — Telehealth (INDEPENDENT_AMBULATORY_CARE_PROVIDER_SITE_OTHER): Payer: Self-pay | Admitting: *Deleted

## 2009-05-01 ENCOUNTER — Ambulatory Visit: Payer: Self-pay | Admitting: Family Medicine

## 2009-05-01 DIAGNOSIS — IMO0002 Reserved for concepts with insufficient information to code with codable children: Secondary | ICD-10-CM

## 2009-05-01 DIAGNOSIS — M1710 Unilateral primary osteoarthritis, unspecified knee: Secondary | ICD-10-CM

## 2009-05-01 HISTORY — DX: Reserved for concepts with insufficient information to code with codable children: IMO0002

## 2009-05-03 ENCOUNTER — Encounter: Payer: Self-pay | Admitting: Internal Medicine

## 2009-05-17 ENCOUNTER — Ambulatory Visit: Payer: Self-pay | Admitting: Internal Medicine

## 2009-06-19 ENCOUNTER — Telehealth: Payer: Self-pay | Admitting: Internal Medicine

## 2009-07-01 ENCOUNTER — Ambulatory Visit (HOSPITAL_COMMUNITY): Admission: RE | Admit: 2009-07-01 | Discharge: 2009-07-01 | Payer: Self-pay | Admitting: Obstetrics & Gynecology

## 2009-07-29 ENCOUNTER — Telehealth: Payer: Self-pay | Admitting: Internal Medicine

## 2009-08-10 ENCOUNTER — Inpatient Hospital Stay (HOSPITAL_COMMUNITY): Admission: AD | Admit: 2009-08-10 | Discharge: 2009-08-10 | Payer: Self-pay | Admitting: Obstetrics & Gynecology

## 2009-08-16 ENCOUNTER — Ambulatory Visit: Payer: Self-pay | Admitting: Internal Medicine

## 2009-08-19 ENCOUNTER — Encounter: Payer: Self-pay | Admitting: Obstetrics & Gynecology

## 2009-08-19 ENCOUNTER — Ambulatory Visit (HOSPITAL_COMMUNITY): Admission: RE | Admit: 2009-08-19 | Discharge: 2009-08-19 | Payer: Self-pay | Admitting: Obstetrics & Gynecology

## 2009-09-17 ENCOUNTER — Ambulatory Visit: Payer: Self-pay | Admitting: Internal Medicine

## 2009-09-27 ENCOUNTER — Telehealth: Payer: Self-pay | Admitting: Internal Medicine

## 2009-11-11 ENCOUNTER — Encounter: Payer: Self-pay | Admitting: Internal Medicine

## 2009-11-18 ENCOUNTER — Ambulatory Visit: Payer: Self-pay | Admitting: Internal Medicine

## 2010-01-20 ENCOUNTER — Ambulatory Visit: Payer: Self-pay | Admitting: Internal Medicine

## 2010-01-20 DIAGNOSIS — F068 Other specified mental disorders due to known physiological condition: Secondary | ICD-10-CM

## 2010-01-20 DIAGNOSIS — F0390 Unspecified dementia without behavioral disturbance: Secondary | ICD-10-CM | POA: Insufficient documentation

## 2010-01-20 HISTORY — DX: Other specified mental disorders due to known physiological condition: F06.8

## 2010-01-21 LAB — CONVERTED CEMR LAB
ALT: 14 units/L (ref 0–35)
AST: 16 units/L (ref 0–37)
BUN: 9 mg/dL (ref 6–23)
Bilirubin, Direct: 0.1 mg/dL (ref 0.0–0.3)
Calcium: 9.2 mg/dL (ref 8.4–10.5)
GFR calc non Af Amer: 63.72 mL/min (ref >60)
Glucose, Bld: 76 mg/dL (ref 70–99)
TSH: 2.12 microintl units/mL (ref 0.35–5.50)
Total Bilirubin: 0.3 mg/dL (ref 0.3–1.2)
VLDL: 13.2 mg/dL (ref 0.0–40.0)

## 2010-01-29 ENCOUNTER — Telehealth: Payer: Self-pay | Admitting: Internal Medicine

## 2010-02-06 ENCOUNTER — Ambulatory Visit: Payer: Self-pay | Admitting: Cardiology

## 2010-02-13 ENCOUNTER — Ambulatory Visit: Payer: Self-pay | Admitting: Internal Medicine

## 2010-02-27 ENCOUNTER — Telehealth: Payer: Self-pay | Admitting: Internal Medicine

## 2010-02-28 ENCOUNTER — Ambulatory Visit: Payer: Self-pay | Admitting: Internal Medicine

## 2010-02-28 LAB — CONVERTED CEMR LAB
ALT: 23 units/L (ref 0–35)
Albumin: 3.6 g/dL (ref 3.5–5.2)
Alkaline Phosphatase: 40 units/L (ref 39–117)
BUN: 14 mg/dL (ref 6–23)
CO2: 31 meq/L (ref 19–32)
GFR calc non Af Amer: 63.7 mL/min (ref >60)
Glucose, Bld: 99 mg/dL (ref 70–99)
Potassium: 4.2 meq/L (ref 3.5–5.1)
Total Protein: 7.1 g/dL (ref 6.0–8.3)

## 2010-03-17 ENCOUNTER — Ambulatory Visit: Payer: Self-pay | Admitting: Internal Medicine

## 2010-03-21 ENCOUNTER — Telehealth: Payer: Self-pay | Admitting: Internal Medicine

## 2010-03-27 ENCOUNTER — Ambulatory Visit: Payer: Self-pay | Admitting: Internal Medicine

## 2010-05-21 ENCOUNTER — Ambulatory Visit: Payer: Self-pay | Admitting: Internal Medicine

## 2010-05-23 ENCOUNTER — Encounter: Payer: Self-pay | Admitting: Internal Medicine

## 2010-06-19 ENCOUNTER — Emergency Department (HOSPITAL_COMMUNITY): Admission: EM | Admit: 2010-06-19 | Discharge: 2010-06-20 | Payer: Self-pay | Admitting: Emergency Medicine

## 2010-06-20 ENCOUNTER — Inpatient Hospital Stay (HOSPITAL_COMMUNITY): Admission: EM | Admit: 2010-06-20 | Discharge: 2010-06-27 | Payer: Self-pay | Admitting: Emergency Medicine

## 2010-06-20 ENCOUNTER — Ambulatory Visit: Payer: Self-pay | Admitting: Cardiology

## 2010-06-22 ENCOUNTER — Encounter (INDEPENDENT_AMBULATORY_CARE_PROVIDER_SITE_OTHER): Payer: Self-pay | Admitting: Internal Medicine

## 2010-06-26 ENCOUNTER — Encounter (INDEPENDENT_AMBULATORY_CARE_PROVIDER_SITE_OTHER): Payer: Self-pay | Admitting: Cardiology

## 2010-06-26 ENCOUNTER — Encounter (INDEPENDENT_AMBULATORY_CARE_PROVIDER_SITE_OTHER): Payer: Self-pay | Admitting: Interventional Cardiology

## 2010-06-30 ENCOUNTER — Emergency Department (HOSPITAL_COMMUNITY): Admission: EM | Admit: 2010-06-30 | Discharge: 2010-06-30 | Payer: Self-pay | Admitting: Emergency Medicine

## 2010-07-01 ENCOUNTER — Ambulatory Visit (HOSPITAL_COMMUNITY): Admission: RE | Admit: 2010-07-01 | Discharge: 2010-07-01 | Payer: Self-pay | Admitting: Internal Medicine

## 2010-07-17 ENCOUNTER — Ambulatory Visit: Admission: RE | Admit: 2010-07-17 | Discharge: 2010-07-17 | Payer: Self-pay | Admitting: Gynecologic Oncology

## 2010-08-01 ENCOUNTER — Encounter: Payer: Self-pay | Admitting: Internal Medicine

## 2010-08-06 ENCOUNTER — Ambulatory Visit: Payer: Self-pay | Admitting: Infectious Diseases

## 2010-08-06 ENCOUNTER — Inpatient Hospital Stay (HOSPITAL_COMMUNITY): Admission: EM | Admit: 2010-08-06 | Discharge: 2010-08-14 | Payer: Self-pay | Admitting: Emergency Medicine

## 2010-08-07 ENCOUNTER — Encounter (INDEPENDENT_AMBULATORY_CARE_PROVIDER_SITE_OTHER): Payer: Self-pay | Admitting: Cardiology

## 2010-08-19 ENCOUNTER — Ambulatory Visit (HOSPITAL_COMMUNITY): Admission: RE | Admit: 2010-08-19 | Discharge: 2010-08-19 | Payer: Self-pay | Admitting: Internal Medicine

## 2010-08-27 ENCOUNTER — Emergency Department: Admission: EM | Admit: 2010-08-27 | Discharge: 2010-08-28 | Payer: Self-pay | Admitting: Emergency Medicine

## 2010-09-01 ENCOUNTER — Ambulatory Visit (HOSPITAL_COMMUNITY): Admission: RE | Admit: 2010-09-01 | Discharge: 2010-09-01 | Payer: Self-pay | Admitting: Internal Medicine

## 2010-09-29 ENCOUNTER — Ambulatory Visit: Payer: Self-pay | Admitting: Infectious Disease

## 2010-09-29 DIAGNOSIS — I38 Endocarditis, valve unspecified: Secondary | ICD-10-CM | POA: Insufficient documentation

## 2010-09-29 DIAGNOSIS — B952 Enterococcus as the cause of diseases classified elsewhere: Secondary | ICD-10-CM

## 2010-09-29 HISTORY — DX: Endocarditis, valve unspecified: I38

## 2010-09-30 ENCOUNTER — Encounter: Payer: Self-pay | Admitting: Internal Medicine

## 2010-09-30 ENCOUNTER — Encounter: Payer: Self-pay | Admitting: Infectious Disease

## 2010-10-01 ENCOUNTER — Other Ambulatory Visit: Payer: Self-pay | Admitting: Emergency Medicine

## 2010-10-02 ENCOUNTER — Inpatient Hospital Stay (HOSPITAL_COMMUNITY): Admission: AD | Admit: 2010-10-02 | Discharge: 2010-10-07 | Payer: Self-pay | Admitting: Internal Medicine

## 2010-10-02 ENCOUNTER — Ambulatory Visit: Payer: Self-pay | Admitting: Internal Medicine

## 2010-12-02 ENCOUNTER — Ambulatory Visit (HOSPITAL_COMMUNITY)
Admission: RE | Admit: 2010-12-02 | Discharge: 2010-12-02 | Payer: Self-pay | Source: Home / Self Care | Attending: Internal Medicine | Admitting: Internal Medicine

## 2010-12-26 ENCOUNTER — Ambulatory Visit: Admit: 2010-12-26 | Payer: Self-pay | Admitting: Internal Medicine

## 2011-01-04 LAB — CONVERTED CEMR LAB
ALT: 40 units/L — ABNORMAL HIGH (ref 0–35)
AST: 28 units/L (ref 0–37)
Albumin: 3.8 g/dL (ref 3.5–5.2)
Alkaline Phosphatase: 54 units/L (ref 39–117)
Basophils Absolute: 0 10*3/uL (ref 0.0–0.1)
Basophils Relative: 1 % (ref 0–1)
Calcium: 9.2 mg/dL (ref 8.4–10.5)
Chloride: 103 meq/L (ref 96–112)
Hemoglobin: 10.7 g/dL — ABNORMAL LOW (ref 12.0–15.0)
Lymphocytes Relative: 22 % (ref 12–46)
Monocytes Absolute: 0.5 10*3/uL (ref 0.1–1.0)
Neutro Abs: 5 10*3/uL (ref 1.7–7.7)
Neutrophils Relative %: 67 % (ref 43–77)
Platelets: 317 10*3/uL (ref 150–400)
Potassium: 4.5 meq/L (ref 3.5–5.3)
RDW: 15.5 % (ref 11.5–15.5)
Sed Rate: 38 mm/hr — ABNORMAL HIGH (ref 0–22)
Sodium: 138 meq/L (ref 135–145)

## 2011-01-08 NOTE — Progress Notes (Signed)
Summary: samples please  Phone Note Call from Patient Call back at Home Phone [phone] Call back at [phone]   Caller: Daughter-melissa Call For: [name] MD Summary of Call: pt needs samples of aricept 10 mg please  Initial call taken by: Heron Sabins,  March 21, 2010 9:11 AM  Follow-up for Phone Call        it has gone generic---we don't have samples anymore it is ok to call in generic doneprezil 10mg  by mouth once daily  Follow-up by: [name] MD,  March 21, 2010 12:32 PM  Additional Follow-up for Phone Call Additional follow up Details #1::        [facility] Additional Follow-up by: Lynann Beaver CMA,  March 21, 2010 1:06 PM    Additional Follow-up for Phone Call Additional follow up Details #2::    Notified family. Follow-up by: Lynann Beaver CMA,  March 21, 2010 3:59 PM

## 2011-01-08 NOTE — Assessment & Plan Note (Signed)
Summary: swelling - rv   Vital Signs:  Patient profile:   70 year old female Weight:      253 pounds Temp:     99.3 degrees F oral Pulse rate:   78 / minute Pulse rhythm:   regular BP sitting:   140 / 90  (left arm) Cuff size:   large  Vitals Entered By: Kern Reap CMA Duncan Dull) (February 28, 2010 8:18 AM) CC: edema feet, face, hands Is Patient Diabetic? No   CC:  edema feet, face, and hands.  History of Present Illness: she complains of "swelling", states that face swells (defined as "bags under my eyes). Says feet and legs swell occasionally. Dependent edema. Pt with complicated medical hx (reviewed). Pt takin furosemide and complianing of increased urination.  no polydipsia.  Duration of new sxs: 2 weeks severity: "bad"  All other systems reviewed and were negative   Current Problems (verified): 1)  Dementia  (ICD-294.8) 2)  Osteoarthritis, Knees, Bilateral  (ICD-715.96) 3)  Obesity  (ICD-278.00) 4)  Cva  (ICD-434.91) 5)  Obstructive Sleep Apnea  (ICD-327.23) 6)  Myocardial Infarction, Hx of  (ICD-412) 7)  Hypertension  (ICD-401.9) 8)  Hyperlipidemia  (ICD-272.4) 9)  Coronary Artery Disease  (ICD-414.00)  Current Medications (verified): 1)  Aspirin Ec 81 Mg Tbec (Aspirin) .... Take 1 Tablet By Mouth Once A Day 2)  Simvastatin 80 Mg  Tabs (Simvastatin) .... One By Mouth Daily 3)  Omeprazole 20 Mg Cpdr (Omeprazole) .... Take 1 Capsule By Mouth Once A Day 4)  Coreg 25 Mg Tabs (Carvedilol) .Marland Kitchen.. 1 By Mouth Two Times A Day 5)  Lisinopril 40 Mg  Tabs (Lisinopril) .... Take 1 Tablet By Mouth Once A Day 6)  Cardizem Cd 360 Mg Xr24h-Cap (Diltiazem Hcl Coated Beads) .... Take 1 Tablet By Mouth Once A Day 7)  Furosemide 40 Mg Tabs (Furosemide) .... Take One Tablet Daily 8)  Klor-Con M20 20 Meq  Tbcr (Potassium Chloride Crys Cr) .... One By Mouth Daily 9)  Ferrous Sulfate 325 (65 Fe) Mg Tabs (Ferrous Sulfate) .... Two Times A Day 10)  Norethindrone Acetate 5 Mg Tabs  (Norethindrone Acetate) .... Take 2 Once Daily 11)  Tylenol 325 Mg Tabs (Acetaminophen) .... As Needed 12)  Aricept 10 Mg Tabs (Donepezil Hydrochloride) .... Take 1 Tab Daily  Allergies (verified): No Known Drug Allergies  Past History:  Past Medical History: Last updated: 02/10/2008 Coronary artery disease Hyperlipidemia Hypertension Myocardial infarction, hx of X2 Cerebrovascular accident, hx of X2 OSA  CPAP Cerebrovascular accident, hx of  Past Surgical History: Last updated: 2007-03-14 Tubal ligation  Family History: Last updated: 03-14-2007 MOther deceased with CHF father deceased unknow n cause (79 yo) Family History Diabetes 1st degree relative Family History Hypertension Family History Kidney disease brother on dialysis  Social History: Last updated: 14-Mar-2007 Single Never Smoked Alcohol use-no Regular exercise-no  Risk Factors: Exercise: no (14-Mar-2007)  Risk Factors: Smoking Status: never (01/20/2010)  Physical Exam  General:  alert and well-developed.   Head:  normocephalic and atraumatic.   Eyes:  pupils equal and pupils round.   Ears:  R ear normal and L ear normal.   Neck:  no masses palpated Chest Wall:  no deformities and no tenderness.   Lungs:  normal respiratory effort and no intercostal retractions.   Abdomen:  soft and non-tender.   Msk:  No deformity or scoliosis noted of thoracic or lumbar spine.   Extremities:  1+ left pedal edema and 1+ right pedal edema.  Neurologic:  cranial nerves II-XII intact and gait normal.   Skin:  turgor normal and color normal.   Psych:  normally interactive and good eye contact.     Impression & Recommendations:  Problem # 1:  EDEMA LEG (ICD-782.3)  reviewed chart has been a previous problem (on exam edema is not terribly impresssive) suspect related to cardizem needs additional bp meds and to stop cardizem see new meds side effects discussed Her updated medication list for this problem  includes:    Furosemide 40 Mg Tabs (Furosemide) .Marland Kitchen... Take one tablet daily  Orders: Venipuncture (13086) TLB-BMP (Basic Metabolic Panel-BMET) (80048-METABOL) TLB-TSH (Thyroid Stimulating Hormone) (84443-TSH) TLB-Hepatic/Liver Function Pnl (80076-HEPATIC)  Complete Medication List: 1)  Aspirin Ec 81 Mg Tbec (Aspirin) .... Take 1 tablet by mouth once a day 2)  Simvastatin 80 Mg Tabs (Simvastatin) .... One by mouth daily 3)  Omeprazole 20 Mg Cpdr (Omeprazole) .... Take 1 capsule by mouth once a day 4)  Coreg 25 Mg Tabs (Carvedilol) .Marland Kitchen.. 1 by mouth two times a day 5)  Lisinopril 40 Mg Tabs (Lisinopril) .... Take 1 tablet by mouth once a day 6)  Furosemide 40 Mg Tabs (Furosemide) .... Take one tablet daily 7)  Klor-con M20 20 Meq Tbcr (Potassium chloride crys cr) .... One by mouth daily 8)  Ferrous Sulfate 325 (65 Fe) Mg Tabs (Ferrous sulfate) .... Two times a day 9)  Norethindrone Acetate 5 Mg Tabs (Norethindrone acetate) .... Take 2 once daily 10)  Tylenol 325 Mg Tabs (Acetaminophen) .... As needed 11)  Aricept 10 Mg Tabs (Donepezil hydrochloride) .... Take 1 tab daily 12)  Hydralazine Hcl 25 Mg Tabs (Hydralazine hcl) .Marland Kitchen.. 1 by mouth 3 times daily  Patient Instructions: 1)  see me 2-4 weeks Prescriptions: HYDRALAZINE HCL 25 MG  TABS (HYDRALAZINE HCL) 1 by mouth 3 times daily  #90 x 3   Entered and Authorized by:   [name] MD   Signed by:   [name] MD on 02/28/2010   Method used:   [facility]        [facility]  [address] [phone]* (retail)       [address]       [place], [place]  [address]       Ph: [phone] or [phone]       Fax: [phone]   RxID:   [id number]

## 2011-01-08 NOTE — Letter (Signed)
Summary: [facility]   Imported By: Maryln Gottron 03/11/2010 12:50:19  _____________________________________________________________________  External Attachment:    Type:   Image     Comment:   External Document

## 2011-01-08 NOTE — Assessment & Plan Note (Signed)
Summary: 2-4 wk follow up/cjr   Vital Signs:  Patient profile:   70 year old female Weight:      250 pounds BMI:     45.89 Temp:     99 degrees F oral Pulse rate:   70 / minute Pulse rhythm:   regular Resp:     16 per minute BP sitting:   140 / 72  (left arm) Cuff size:   large  Vitals Entered By: Gladis Riffle, RN (March 17, 2010 9:56 AM) CC: 2-4 week rov Is Patient Diabetic? No   CC:  2-4 week rov.  History of Present Illness: f/u swelling : she and family member state sxs resolved tolerating meds without difficulty  HTN---here for f/u0--reviewed last note---see med changes  memory: stable on meds---she has help with medications (family members arrange for her)  urinary frequency---family complains she is going to the bathroom too much (furosemide two times a day)  Preventive Screening-Counseling & Management  Alcohol-Tobacco     Smoking Status: never  Current Problems (verified): 1)  Dementia  (ICD-294.8) 2)  Osteoarthritis, Knees, Bilateral  (ICD-715.96) 3)  Obesity  (ICD-278.00) 4)  Cva  (ICD-434.91) 5)  Obstructive Sleep Apnea  (ICD-327.23) 6)  Myocardial Infarction, Hx of  (ICD-412) 7)  Hypertension  (ICD-401.9) 8)  Hyperlipidemia  (ICD-272.4) 9)  Coronary Artery Disease  (ICD-414.00)  Current Medications (verified): 1)  Aspirin Ec 81 Mg Tbec (Aspirin) .... Take 1 Tablet By Mouth Once A Day 2)  Simvastatin 80 Mg  Tabs (Simvastatin) .... One By Mouth Daily 3)  Omeprazole 20 Mg Cpdr (Omeprazole) .... Take 1 Capsule By Mouth Once A Day 4)  Coreg 25 Mg Tabs (Carvedilol) .Marland Kitchen.. 1 By Mouth Two Times A Day 5)  Lisinopril 40 Mg  Tabs (Lisinopril) .... Take 1 Tablet By Mouth Once A Day 6)  Furosemide 40 Mg Tabs (Furosemide) .... Take One Tablet Daily 7)  Klor-Con M20 20 Meq  Tbcr (Potassium Chloride Crys Cr) .... One By Mouth Daily 8)  Ferrous Sulfate 325 (65 Fe) Mg Tabs (Ferrous Sulfate) .... Two Times A Day 9)  Norethindrone Acetate 5 Mg Tabs (Norethindrone  Acetate) .... Take 2 Once Daily 10)  Tylenol 325 Mg Tabs (Acetaminophen) .... As Needed 11)  Aricept 10 Mg Tabs (Donepezil Hydrochloride) .... Take 1 Tab Daily 12)  Hydralazine Hcl 25 Mg  Tabs (Hydralazine Hcl) .Marland Kitchen.. 1 By Mouth 3 Times Daily  Allergies (verified): No Known Drug Allergies  Past History:  Past Medical History: Last updated: 02/10/2008 Coronary artery disease Hyperlipidemia Hypertension Myocardial infarction, hx of X2 Cerebrovascular accident, hx of X2 OSA  CPAP Cerebrovascular accident, hx of  Past Surgical History: Last updated: 02-21-2007 Tubal ligation  Family History: Last updated: 2007/02/21 MOther deceased with CHF father deceased unknow n cause (40 yo) Family History Diabetes 1st degree relative Family History Hypertension Family History Kidney disease brother on dialysis  Social History: Last updated: 02-21-07 Single Never Smoked Alcohol use-no Regular exercise-no  Risk Factors: Exercise: no (Feb 21, 2007)  Risk Factors: Smoking Status: never (03/17/2010)  Physical Exam  General:  alert and well-developed.   Head:  normocephalic and atraumatic.   Eyes:  pupils equal and pupils round.   Ears:  R ear normal and L ear normal.   Neck:  no masses palpated Chest Wall:  no deformities and no tenderness.   Lungs:  normal respiratory effort and no intercostal retractions.   Abdomen:  soft and non-tender.   Msk:  No deformity or scoliosis noted  of thoracic or lumbar spine.   Neurologic:  cranial nerves II-XII intact and gait normal.     Impression & Recommendations:  Problem # 1:  HYPERTENSION (ICD-401.9) adequate control and edema resolved family and patient concerned with furosemide frequency---see new dose Her updated medication list for this problem includes:    Coreg 25 Mg Tabs (Carvedilol) .Marland Kitchen... 1 by mouth two times a day    Lisinopril 40 Mg Tabs (Lisinopril) .Marland Kitchen... Take 1 tablet by mouth once a day    Furosemide 40 Mg Tabs  (Furosemide) .Marland Kitchen... Take one tablet daily    Hydralazine Hcl 25 Mg Tabs (Hydralazine hcl) .Marland Kitchen... 1 by mouth 3 times daily  BP today: 140/72 Prior BP: 140/90 (02/28/2010)  Prior 10 Yr Risk Heart Disease: N/A (07/07/2007)  Labs Reviewed: K+: 4.2 (02/28/2010) Creat: : 1.1 (02/28/2010)   Chol: 119 (01/20/2010)   HDL: 42.40 (01/20/2010)   LDL: 63 (01/20/2010)   TG: 66.0 (01/20/2010)  Problem # 2:  CVA (ICD-434.91) no recurrence Her updated medication list for this problem includes:    Aspirin Ec 81 Mg Tbec (Aspirin) .Marland Kitchen... Take 1 tablet by mouth once a day  Problem # 3:  CORONARY ARTERY DISEASE (ICD-414.00)  no sxs continue current medications  Her updated medication list for this problem includes:    Aspirin Ec 81 Mg Tbec (Aspirin) .Marland Kitchen... Take 1 tablet by mouth once a day    Coreg 25 Mg Tabs (Carvedilol) .Marland Kitchen... 1 by mouth two times a day    Lisinopril 40 Mg Tabs (Lisinopril) .Marland Kitchen... Take 1 tablet by mouth once a day    Furosemide 40 Mg Tabs (Furosemide) .Marland Kitchen... Take one tablet daily    Hydralazine Hcl 25 Mg Tabs (Hydralazine hcl) .Marland Kitchen... 1 by mouth 3 times daily  Labs Reviewed: Chol: 119 (01/20/2010)   HDL: 42.40 (01/20/2010)   LDL: 63 (01/20/2010)   TG: 66.0 (01/20/2010)  Lipid Goals: Chol Goal: 200 (07/07/2007)   HDL Goal: 40 (07/07/2007)   LDL Goal: 100 (07/07/2007)   TG Goal: 150 (07/07/2007)  Complete Medication List: 1)  Aspirin Ec 81 Mg Tbec (Aspirin) .... Take 1 tablet by mouth once a day 2)  Simvastatin 80 Mg Tabs (Simvastatin) .... One by mouth daily 3)  Omeprazole 20 Mg Cpdr (Omeprazole) .... Take 1 capsule by mouth once a day 4)  Coreg 25 Mg Tabs (Carvedilol) .Marland Kitchen.. 1 by mouth two times a day 5)  Lisinopril 40 Mg Tabs (Lisinopril) .... Take 1 tablet by mouth once a day 6)  Furosemide 40 Mg Tabs (Furosemide) .... Take one tablet daily 7)  Klor-con M20 20 Meq Tbcr (Potassium chloride crys cr) .... One by mouth daily 8)  Ferrous Sulfate 325 (65 Fe) Mg Tabs (Ferrous sulfate) .... Two  times a day 9)  Norethindrone Acetate 5 Mg Tabs (Norethindrone acetate) .... Take 2 once daily 10)  Tylenol 325 Mg Tabs (Acetaminophen) .... As needed 11)  Aricept 10 Mg Tabs (Donepezil hydrochloride) .... Take 1 tab daily 12)  Hydralazine Hcl 25 Mg Tabs (Hydralazine hcl) .Marland Kitchen.. 1 by mouth 3 times daily  Patient Instructions: 1)  Please schedule a follow-up appointment in 2 months.

## 2011-01-08 NOTE — Letter (Signed)
Summary: [facility] at [facility] at [facility]   Imported By: Maryln Gottron 08/06/2010 15:22:33  _____________________________________________________________________  External Attachment:    Type:   Image     Comment:   External Document

## 2011-01-08 NOTE — Assessment & Plan Note (Signed)
Summary: 3 months fup//ccm rsc bmp/njr   Vital Signs:  Patient profile:   70 year old female Weight:      249 pounds BMI:     45.71 Temp:     98.9 degrees F oral Pulse rate:   80 / minute Pulse rhythm:   regular Resp:     14 per minute BP sitting:   132 / 66  (left arm) Cuff size:   large  Vitals Entered By: Gladis Riffle, RN (May 21, 2010 10:38 AM) CC: 3 month rov, c/o right foot swelling x 2 weeks Is Patient Diabetic? No   CC:  3 month rov and c/o right foot swelling x 2 weeks.  History of Present Illness:  Follow-Up Visit      This is a 69 year old woman who presents for Follow-up visit.  The patient denies chest pain and palpitations.  Since the last visit the patient notes no new problems or concerns.  The patient reports taking meds as prescribed.  When questioned about possible medication side effects, the patient notes none.   All other systems reviewed and were negative   Preventive Screening-Counseling & Management  Alcohol-Tobacco     Smoking Status: never  Current Problems (verified): 1)  Dementia  (ICD-294.8) 2)  Osteoarthritis, Knees, Bilateral  (ICD-715.96) 3)  Obesity  (ICD-278.00) 4)  Cva  (ICD-434.91) 5)  Obstructive Sleep Apnea  (ICD-327.23) 6)  Myocardial Infarction, Hx of  (ICD-412) 7)  Hypertension  (ICD-401.9) 8)  Hyperlipidemia  (ICD-272.4) 9)  Coronary Artery Disease  (ICD-414.00)  Current Medications (verified): 1)  Aspirin Ec 81 Mg Tbec (Aspirin) .... Take 1 Tablet By Mouth Once A Day 2)  Simvastatin 80 Mg  Tabs (Simvastatin) .... One By Mouth Daily 3)  Omeprazole 20 Mg Cpdr (Omeprazole) .... Take 1 Capsule By Mouth Once A Day 4)  Coreg 25 Mg Tabs (Carvedilol) .Marland Kitchen.. 1 By Mouth Two Times A Day 5)  Lisinopril 40 Mg  Tabs (Lisinopril) .... Take 1 Tablet By Mouth Once A Day 6)  Furosemide 40 Mg Tabs (Furosemide) .... Take One Tablet Daily 7)  Klor-Con M20 20 Meq  Tbcr (Potassium Chloride Crys Cr) .... One By Mouth Daily 8)  Ferrous Sulfate 325  (65 Fe) Mg Tabs (Ferrous Sulfate) .... Two Times A Day 9)  Norethindrone Acetate 5 Mg Tabs (Norethindrone Acetate) .... Take 2 Once Daily 10)  Tylenol 325 Mg Tabs (Acetaminophen) .... As Needed 11)  Aricept 10 Mg Tabs (Donepezil Hydrochloride) .... Take 1 Tab Daily 12)  Hydralazine Hcl 25 Mg  Tabs (Hydralazine Hcl) .Marland Kitchen.. 1 By Mouth 3 Times Daily  Allergies (verified): No Known Drug Allergies  Physical Exam  General:  alert and well-developed.   Head:  normocephalic and atraumatic.   Eyes:  pupils equal and pupils round.   Neck:  no masses palpated Chest Wall:  no deformities and no tenderness.   Lungs:  normal respiratory effort and no intercostal retractions.   Heart:  normal rate and regular rhythm.   Abdomen:  soft and non-tender.   Msk:  No deformity or scoliosis noted of thoracic or lumbar spine.   Skin:  turgor normal and color normal.   Psych:  normally interactive and good eye contact.     Impression & Recommendations:  Problem # 1:  CVA (ICD-434.91) no recurrent sxs Her updated medication list for this problem includes:    Aspirin Ec 81 Mg Tbec (Aspirin) .Marland Kitchen... Take 1 tablet by mouth once a day  Problem # 2:  HYPERLIPIDEMIA (ICD-272.4) note high dose simvastatin will change to 40 mg by mouth once daily  Her updated medication list for this problem includes:    Simvastatin 40 Mg Tabs (Simvastatin) .Marland Kitchen... Take one tablet at bedtime  Labs Reviewed: SGOT: 23 (02/28/2010)   SGPT: 23 (02/28/2010)  Lipid Goals: Chol Goal: 200 (07/07/2007)   HDL Goal: 40 (07/07/2007)   LDL Goal: 100 (07/07/2007)   TG Goal: 150 (07/07/2007)  Prior 10 Yr Risk Heart Disease: N/A (07/07/2007)   HDL:42.40 (01/20/2010), 46.6 (01/16/2009)  LDL:63 (01/20/2010), 73 (01/16/2009)  Chol:119 (01/20/2010), 137 (01/16/2009)  Trig:66.0 (01/20/2010), 86 (01/16/2009)  Problem # 3:  CORONARY ARTERY DISEASE (ICD-414.00) no sxs continue current medications  Her updated medication list for this problem  includes:    Aspirin Ec 81 Mg Tbec (Aspirin) .Marland Kitchen... Take 1 tablet by mouth once a day    Coreg 25 Mg Tabs (Carvedilol) .Marland Kitchen... 1 by mouth two times a day    Lisinopril 40 Mg Tabs (Lisinopril) .Marland Kitchen... Take 1 tablet by mouth once a day    Furosemide 40 Mg Tabs (Furosemide) .Marland Kitchen... Take one tablet daily    Hydralazine Hcl 25 Mg Tabs (Hydralazine hcl) .Marland Kitchen... 1 by mouth 3 times daily  Complete Medication List: 1)  Aspirin Ec 81 Mg Tbec (Aspirin) .... Take 1 tablet by mouth once a day 2)  Simvastatin 40 Mg Tabs (Simvastatin) .... Take one tablet at bedtime 3)  Omeprazole 20 Mg Cpdr (Omeprazole) .... Take 1 capsule by mouth once a day 4)  Coreg 25 Mg Tabs (Carvedilol) .Marland Kitchen.. 1 by mouth two times a day 5)  Lisinopril 40 Mg Tabs (Lisinopril) .... Take 1 tablet by mouth once a day 6)  Furosemide 40 Mg Tabs (Furosemide) .... Take one tablet daily 7)  Klor-con M20 20 Meq Tbcr (Potassium chloride crys cr) .... One by mouth daily 8)  Ferrous Sulfate 325 (65 Fe) Mg Tabs (Ferrous sulfate) .... Two times a day 9)  Norethindrone Acetate 5 Mg Tabs (Norethindrone acetate) .... Take 2 once daily 10)  Tylenol 325 Mg Tabs (Acetaminophen) .... As needed 11)  Aricept 10 Mg Tabs (Donepezil hydrochloride) .... Take 1 tab daily 12)  Hydralazine Hcl 25 Mg Tabs (Hydralazine hcl) .Marland Kitchen.. 1 by mouth 3 times daily  Patient Instructions: 1)  Please schedule a follow-up appointment in 3 months. Prescriptions: SIMVASTATIN 40 MG TABS (SIMVASTATIN) Take one tablet at bedtime  #90 x 3   Entered and Authorized by:   [name] MD   Signed by:   [name] MD on 05/21/2010   Method used:   [facility]        [facility]  [address] [phone]* (retail)       [address]       [place], [place]  [address]       Ph: [phone] or [phone]       Fax: [phone]   RxID:   [id number]

## 2011-01-08 NOTE — Letter (Signed)
Summary: [facility]  [facility]   Imported By: Maryln Gottron 06/27/2010 12:56:18  _____________________________________________________________________  External Attachment:    Type:   Image     Comment:   External Document

## 2011-01-08 NOTE — Progress Notes (Signed)
Summary: Patient having increase fluid retention  Phone Note Call from Patient Call back at [phone]   Caller: Daughter Reason for Call: Talk to Nurse Summary of Call: Daughter needs to speak to you about current medications her mother is on.  Patient is holding a lot of fluid. Initial call taken by: Everrett Coombe,  February 27, 2010 1:13 PM  Follow-up for Phone Call        daughter states that the patient is having  a lot of swelling with her face, hands, and feet.  Her eyes are sometimes swollen shut in the morning.  She is however having frequent urination.  Any suggestions? Follow-up by: Kern Reap CMA Duncan Dull),  February 27, 2010 1:19 PM  Additional Follow-up for Phone Call Additional follow up Details #1::        unclear to me what the cause might be schedule OV Additional Follow-up by: [name] MD,  February 27, 2010 4:05 PM    Additional Follow-up for Phone Call Additional follow up Details #2::    daughter aware. patient has appointment in the morning Follow-up by: Kern Reap CMA Duncan Dull),  February 27, 2010 4:18 PM

## 2011-01-08 NOTE — Letter (Signed)
Summary: [facility]   Imported By: Sherian Rein 08/15/2010 13:50:43  _____________________________________________________________________  External Attachment:    Type:   Image     Comment:   External Document

## 2011-01-08 NOTE — Assessment & Plan Note (Signed)
Summary: ?dementia/cjr   Vital Signs:  Patient profile:   70 year old female Weight:      242 pounds BMI:     44.42 Temp:     99.3 degrees F Pulse rate:   72 / minute Resp:     12 per minute BP sitting:   118 / 64  (left arm)  Vitals Entered By: Gladis Riffle, RN (January 20, 2010 12:10 PM) CC: c/o memory loss, is up at night and sleeps during day, worse in last week Is Patient Diabetic? No   CC:  c/o memory loss, is up at night and sleeps during day, and worse in last week.  History of Present Illness: progressive memory issues has required her to move in with daughter and son-in-law she gets confused spreads feces   Preventive Screening-Counseling & Management  Alcohol-Tobacco     Smoking Status: never  Current Medications (verified): 1)  Aspirin Ec 81 Mg Tbec (Aspirin) .... Take 1 Tablet By Mouth Once A Day 2)  Simvastatin 80 Mg  Tabs (Simvastatin) .... One By Mouth Daily 3)  Omeprazole 20 Mg Cpdr (Omeprazole) .... Take 1 Capsule By Mouth Once A Day 4)  Coreg 25 Mg Tabs (Carvedilol) .Marland Kitchen.. 1 By Mouth Two Times A Day 5)  Lisinopril 40 Mg  Tabs (Lisinopril) .... Take 1 Tablet By Mouth Once A Day 6)  Cardizem Cd 360 Mg Xr24h-Cap (Diltiazem Hcl Coated Beads) .... Take 1 Tablet By Mouth Once A Day 7)  Furosemide 40 Mg Tabs (Furosemide) .... Take One Tablet Daily 8)  Klor-Con M20 20 Meq  Tbcr (Potassium Chloride Crys Cr) .... One By Mouth Daily 9)  Ferrous Sulfate 325 (65 Fe) Mg Tabs (Ferrous Sulfate) .... Two Times A Day 10)  Norethindrone Acetate 5 Mg Tabs (Norethindrone Acetate) .... Take 2 Once Daily 11)  Tylenol 325 Mg Tabs (Acetaminophen) .... As Needed  Allergies (verified): No Known Drug Allergies  Past History:  Past Medical History: Last updated: 02/10/2008 Coronary artery disease Hyperlipidemia Hypertension Myocardial infarction, hx of X2 Cerebrovascular accident, hx of X2 OSA  CPAP Cerebrovascular accident, hx of  Past Surgical History: Last updated:  2007/03/02 Tubal ligation  Family History: Last updated: 2007-03-02 MOther deceased with CHF father deceased unknow n cause (57 yo) Family History Diabetes 1st degree relative Family History Hypertension Family History Kidney disease brother on dialysis  Social History: Last updated: March 02, 2007 Single Never Smoked Alcohol use-no Regular exercise-no  Risk Factors: Exercise: no (03/02/2007)  Risk Factors: Smoking Status: never (01/20/2010)  Review of Systems       All other systems reviewed and were negative   Physical Exam  General:  alert and well-developed.   Head:  normocephalic and atraumatic.   Eyes:  pupils equal and pupils round.   Ears:  R ear normal and L ear normal.   Neck:  no masses palpated Chest Wall:  no deformities and no tenderness.   Lungs:  clear to auscultationnormal respiratory effort and no intercostal retractions.   Heart:  normal rate and regular rhythm.   Abdomen:  soft and nontender obese Msk:  No deformity or scoliosis noted of thoracic or lumbar spine.   Pulses:  R radial normal and L radial normal.   Neurologic:  cranial nerves II-XII intact and gait normal.   Skin:  turgor normal and color normal.   Psych:  good eye contact and not anxious appearing.     Impression & Recommendations:  Problem # 1:  DEMENTIA (ICD-294.8)  suspect  she has dementia needs evaluation check labs and imaging studies start aricept  Orders: Venipuncture ([address]) Radiology Referral (Radiology) Misc. Referral (Misc. Ref) TLB-TSH (Thyroid Stimulating Hormone) (84443-TSH) TLB-B12 + Folate Pnl [phone])   Neuroimaging - The use of neuroimaging in patients with dementia is controversial. A number of guidelines on the clinical evaluation of dementia have been published, many of which do not recommend imaging studies routinely, but include clinical prediction rules to identify patients who might have reversible causes of dementia that can be  diagnosed with imaging studies (eg, subdural hematoma, normal pressure hydrocephalus, treatable cancer) [8,66,71-77]. The prediction rules vary, including factors such as young age (<60), focal signs, short duration of symptoms (less than two years), among others. However, the sensitivity and specificity of these prediction rules is low [78]. The AAN recommends structural neuroimaging with either a noncontrast head CT or MRI in the routine initial evaluation of all patients with dementia [8].  Complete Medication List: 1)  Aspirin Ec 81 Mg Tbec (Aspirin) .... Take 1 tablet by mouth once a day 2)  Simvastatin 80 Mg Tabs (Simvastatin) .... One by mouth daily 3)  Omeprazole 20 Mg Cpdr (Omeprazole) .... Take 1 capsule by mouth once a day 4)  Coreg 25 Mg Tabs (Carvedilol) .Marland Kitchen.. 1 by mouth two times a day 5)  Lisinopril 40 Mg Tabs (Lisinopril) .... Take 1 tablet by mouth once a day 6)  Cardizem Cd 360 Mg Xr24h-cap (Diltiazem hcl coated beads) .... Take 1 tablet by mouth once a day 7)  Furosemide 40 Mg Tabs (Furosemide) .... Take one tablet daily 8)  Klor-con M20 20 Meq Tbcr (Potassium chloride crys cr) .... One by mouth daily 9)  Ferrous Sulfate 325 (65 Fe) Mg Tabs (Ferrous sulfate) .... Two times a day 10)  Norethindrone Acetate 5 Mg Tabs (Norethindrone acetate) .... Take 2 once daily 11)  Tylenol 325 Mg Tabs (Acetaminophen) .... As needed 12)  Aricept 5 Mg Tabs (Donepezil hydrochloride) .Marland Kitchen.. 1 by mouth daily  Other Orders: TLB-BMP (Basic Metabolic Panel-BMET) (80048-METABOL) TLB-Lipid Panel (80061-LIPID) TLB-Hepatic/Liver Function Pnl (80076-HEPATIC)  Patient Instructions: 1)  see me 4 weeks Prescriptions: ARICEPT 5 MG TABS (DONEPEZIL HYDROCHLORIDE) 1 by mouth daily  #30 x 1   Entered and Authorized by:   [name] MD   Signed by:   [name] MD on 01/20/2010   Method used:   [facility]        [facility] [address] [phone]* (retail)       [address]       [place], [place]  [address]       Ph: [phone] or [phone]       Fax: [phone]   RxID:   [id number]    Mental Status Assessment:  Mental Status Exam: (value/max value)    Orientation to Time: 2/5    Orientation to Place: 4/5    Registration: 3/3    Attention/Calculation: 2/5    Recall: 0/3    Language-name 2 objects: 2/2    MSE Total score: 13/30 13/23---significant memory loss

## 2011-01-08 NOTE — Progress Notes (Signed)
Summary: MRI denied  Phone Note Other Incoming   Caller: [facility] via fax Summary of Call: MRI denied due to [facility] Acad Neuro guidelines.  Asking if you will consider CT scan.  Please send response to [facility]. Initial call taken by: Gladis Riffle, RN,  January 29, 2010 2:13 PM  Follow-up for Phone Call        ok to start with CTwithout contrast Follow-up by: [name] MD,  January 30, 2010 8:01 AM  Additional Follow-up for Phone Call Additional follow up Details #1::        Sent amended authorization request to [facility]. Additional Follow-up by: Corky Mull,  January 30, 2010 4:47 PM

## 2011-01-08 NOTE — Assessment & Plan Note (Signed)
Summary: hsfu   Vital Signs:  Patient profile:   70 year old female Height:      62 inches (157.48 cm) Weight:      217 pounds (98.64 kg) BMI:     39.83 Temp:     99.4 degrees F (37.44 degrees C) oral Pulse rate:   70 / minute BP sitting:   136 / 75  (right arm)  Vitals Entered By: Starleen Arms CMA (September 29, 2010 3:05 PM) CC: hsfu Is Patient Diabetic? No Pain Assessment Patient in pain? no      Nutritional Status BMI of > 30 = obese Nutritional Status Detail nl  Does patient need assistance? Ambulation Impaired:Risk for fall, Wheelchair Comments patient currently in nusrsing home   CC:  hsfu.  History of [facility] Illness: [facility]  Allergies (verified): No Known Drug Allergies   Other Orders: T-Comprehensive Metabolic Panel [phone]) T-CBC w/Diff [phone]) T-C-Reactive Protein [phone]) T-Sed Rate (Automated) [phone]) T-Culture, Blood Routine ([id number]) T-Culture, Blood Routine (02725-36644) T-Urinalysis (03474-25956) T-Culture, Urine ([id number]) T-Sed Rate (Automated) (51884-16606)  Patient Instructions: 1)  we will get some blood work today including cultures 2)  We will make decisoin on wha to do with your pICC line based on results of this blood work 3)  We will make followjup appt based on these labs   Orders Added: 1)  T-Comprehensive Metabolic Panel [80053-22900] 2)  T-CBC w/Diff [30160-10932] 3)  T-C-Reactive Protein [35573-22025] 4)  T-Sed Rate (Automated) [42706-23762] 5)  T-Culture, Blood Routine [87040-70240] 6)  T-Culture, Blood Routine [87040-70240] 7)  T-Urinalysis [81003-65000] 8)  T-Culture, Urine [[id number]] 9)  T-Sed Rate (Automated) [73710-62694]  Appended Document: hsfu I wrote the ID clinic note on this pt in wrong pts chart. Harlan Stains is going to be correcting this shortly I believe.

## 2011-01-08 NOTE — Assessment & Plan Note (Signed)
Summary: 3 WK ROV/NJR/PT RSC FROM BMP/CJR   Vital Signs:  Patient profile:   70 year old female Weight:      250 pounds Temp:     994 degrees F oral Pulse rate:   80 / minute Pulse rhythm:   regular Resp:     16 per minute BP sitting:   136 / 80  Vitals Entered By: Lynann Beaver CMA (26-Feb-2010 4:10 PM) CC: rov Is Patient Diabetic? No Pain Assessment Patient in pain? no        CC:  rov.  History of Present Illness:  Follow-Up Visit      This is a 70 year old woman who presents for Follow-up visit.  The patient denies chest pain and palpitations.  Since the last visit the patient notes no new problems or concerns.  The patient reports taking meds as prescribed.  When questioned about possible medication side effects, the patient notes none.    All other systems reviewed and were negative   Current Medications (verified): 1)  Aspirin Ec 81 Mg Tbec (Aspirin) .... Take 1 Tablet By Mouth Once A Day 2)  Simvastatin 80 Mg  Tabs (Simvastatin) .... One By Mouth Daily 3)  Omeprazole 20 Mg Cpdr (Omeprazole) .... Take 1 Capsule By Mouth Once A Day 4)  Coreg 25 Mg Tabs (Carvedilol) .Marland Kitchen.. 1 By Mouth Two Times A Day 5)  Lisinopril 40 Mg  Tabs (Lisinopril) .... Take 1 Tablet By Mouth Once A Day 6)  Cardizem Cd 360 Mg Xr24h-Cap (Diltiazem Hcl Coated Beads) .... Take 1 Tablet By Mouth Once A Day 7)  Furosemide 40 Mg Tabs (Furosemide) .... Take One Tablet Daily 8)  Klor-Con M20 20 Meq  Tbcr (Potassium Chloride Crys Cr) .... One By Mouth Daily 9)  Ferrous Sulfate 325 (65 Fe) Mg Tabs (Ferrous Sulfate) .... Two Times A Day 10)  Norethindrone Acetate 5 Mg Tabs (Norethindrone Acetate) .... Take 2 Once Daily 11)  Tylenol 325 Mg Tabs (Acetaminophen) .... As Needed 12)  Aricept 5 Mg Tabs (Donepezil Hydrochloride) .Marland Kitchen.. 1 By Mouth Daily  Allergies (verified): No Known Drug Allergies  Past History:  Past Medical History: Last updated: 02/10/2008 Coronary artery  disease Hyperlipidemia Hypertension Myocardial infarction, hx of X2 Cerebrovascular accident, hx of X2 OSA  CPAP Cerebrovascular accident, hx of  Past Surgical History: Last updated: 02/27/2007 Tubal ligation  Family History: Last updated: 2007-02-27 MOther deceased with CHF father deceased unknow n cause (39 yo) Family History Diabetes 1st degree relative Family History Hypertension Family History Kidney disease brother on dialysis  Social History: Last updated: 2007-02-27 Single Never Smoked Alcohol use-no Regular exercise-no  Risk Factors: Exercise: no (02-27-07)  Risk Factors: Smoking Status: never (01/20/2010)  Review of Systems       All other systems reviewed and were negative   Physical Exam  General:  alert and well-developed.   Head:  normocephalic and atraumatic.   Eyes:  pupils equal and pupils round.   Neck:  no masses palpated Chest Wall:  no deformities and no tenderness.   Lungs:  clear to auscultationnormal respiratory effort and no intercostal retractions.   Heart:  normal rate and regular rhythm.   Abdomen:  soft and nontender obese Msk:  No deformity or scoliosis noted of thoracic or lumbar spine.   Neurologic:  cranial nerves II-XII intact and gait normal.   Skin:  turgor normal and color normal.   Psych:  memory intact for recent and remote and normally interactive.  Impression & Recommendations:  Problem # 1:  DEMENTIA (ICD-294.8) clinically stable tolerating meds side effects with patient and daughter  Problem # 2:  CVA (ICD-434.91) no recurrent sxs Her updated medication list for this problem includes:    Aspirin Ec 81 Mg Tbec (Aspirin) .Marland Kitchen... Take 1 tablet by mouth once a day  Problem # 3:  CORONARY ARTERY DISEASE (ICD-414.00) no sxs of CP, PND, orthopnea SOB with exertion is stable Her updated medication list for this problem includes:    Aspirin Ec 81 Mg Tbec (Aspirin) .Marland Kitchen... Take 1 tablet by mouth once a day     Coreg 25 Mg Tabs (Carvedilol) .Marland Kitchen... 1 by mouth two times a day    Lisinopril 40 Mg Tabs (Lisinopril) .Marland Kitchen... Take 1 tablet by mouth once a day    Furosemide 40 Mg Tabs (Furosemide) .Marland Kitchen... Take one tablet daily    Hydralazine Hcl 25 Mg Tabs (Hydralazine hcl) .Marland Kitchen... 1 by mouth 3 times daily  Complete Medication List: 1)  Aspirin Ec 81 Mg Tbec (Aspirin) .... Take 1 tablet by mouth once a day 2)  Simvastatin 80 Mg Tabs (Simvastatin) .... One by mouth daily 3)  Omeprazole 20 Mg Cpdr (Omeprazole) .... Take 1 capsule by mouth once a day 4)  Coreg 25 Mg Tabs (Carvedilol) .Marland Kitchen.. 1 by mouth two times a day 5)  Lisinopril 40 Mg Tabs (Lisinopril) .... Take 1 tablet by mouth once a day 6)  Furosemide 40 Mg Tabs (Furosemide) .... Take one tablet daily 7)  Klor-con M20 20 Meq Tbcr (Potassium chloride crys cr) .... One by mouth daily 8)  Ferrous Sulfate 325 (65 Fe) Mg Tabs (Ferrous sulfate) .... Two times a day 9)  Norethindrone Acetate 5 Mg Tabs (Norethindrone acetate) .... Take 2 once daily 10)  Tylenol 325 Mg Tabs (Acetaminophen) .... As needed 11)  Aricept 10 Mg Tabs (Donepezil hydrochloride) .... Take 1 tab daily 12)  Hydralazine Hcl 25 Mg Tabs (Hydralazine hcl) .Marland Kitchen.. 1 by mouth 3 times daily  Patient Instructions: 1)  Please schedule a follow-up appointment in 3 months. Prescriptions: KLOR-CON M20 20 MEQ  TBCR (POTASSIUM CHLORIDE CRYS CR) one by mouth daily  #30 x 6   Entered by:   Willy Eddy, LPN   Authorized by:   [name] MD   Signed by:   Willy Eddy, LPN on 40/98/1191   Method used:   [facility]        [facility] [address] [phone]* (retail)       [address]       [place], [place]  [address]       Ph: [phone] or [phone]       Fax: [phone]   RxID:   [id number] FUROSEMIDE 40 MG TABS (FUROSEMIDE) take one tablet daily  #30 Tablet x 5   Entered by:   Willy Eddy, LPN   Authorized by:   [name] MD   Signed by:   Willy Eddy, LPN on 75/64/3329   Method used:   [facility] to        [facility] [address] [phone]* (retail)       [address]       [place], [place]  [address]       Ph: [phone] or [phone]       Fax: [phone]   RxID:   [id number] ARICEPT 10  MG TABS (DONEPEZIL HYDROCHLORIDE) Take 1 tab daily  #90 x 3   Entered and Authorized by:   [name] MD   Signed by:   [name] MD on 02/13/2010   Method used:   Electronically to        [facility] [address] [phone]* (retail)       [address]       [place], [place]  [address]       Ph: [phone] or [phone]       Fax: [phone]   RxID:   [id number]

## 2011-01-21 ENCOUNTER — Telehealth: Payer: Self-pay | Admitting: Internal Medicine

## 2011-01-21 ENCOUNTER — Encounter: Payer: Self-pay | Admitting: Internal Medicine

## 2011-01-21 NOTE — Telephone Encounter (Signed)
Need verbal order for transfer bench

## 2011-01-21 NOTE — Telephone Encounter (Signed)
[facility] given verbal order for transfer bench.

## 2011-01-21 NOTE — Telephone Encounter (Signed)
ok 

## 2011-01-22 ENCOUNTER — Encounter: Payer: Self-pay | Admitting: Internal Medicine

## 2011-01-22 ENCOUNTER — Ambulatory Visit (INDEPENDENT_AMBULATORY_CARE_PROVIDER_SITE_OTHER): Payer: No Typology Code available for payment source | Admitting: Internal Medicine

## 2011-01-22 DIAGNOSIS — I2510 Atherosclerotic heart disease of native coronary artery without angina pectoris: Secondary | ICD-10-CM

## 2011-01-22 DIAGNOSIS — E669 Obesity, unspecified: Secondary | ICD-10-CM

## 2011-01-22 DIAGNOSIS — F068 Other specified mental disorders due to known physiological condition: Secondary | ICD-10-CM

## 2011-01-22 DIAGNOSIS — E785 Hyperlipidemia, unspecified: Secondary | ICD-10-CM

## 2011-01-22 DIAGNOSIS — I10 Essential (primary) hypertension: Secondary | ICD-10-CM

## 2011-01-22 DIAGNOSIS — I38 Endocarditis, valve unspecified: Secondary | ICD-10-CM

## 2011-01-22 DIAGNOSIS — I6350 Cerebral infarction due to unspecified occlusion or stenosis of unspecified cerebral artery: Secondary | ICD-10-CM

## 2011-01-22 DIAGNOSIS — B952 Enterococcus as the cause of diseases classified elsewhere: Secondary | ICD-10-CM

## 2011-01-22 LAB — CBC WITH DIFFERENTIAL/PLATELET
Basophils Relative: 0.3 % (ref 0.0–3.0)
Eosinophils Relative: 1.8 % (ref 0.0–5.0)
Hemoglobin: 13.2 g/dL (ref 12.0–15.0)
Lymphocytes Relative: 23 % (ref 12.0–46.0)
MCHC: 32.8 g/dL (ref 30.0–36.0)
Monocytes Relative: 6.7 % (ref 3.0–12.0)
Neutro Abs: 3.7 10*3/uL (ref 1.4–7.7)
Neutrophils Relative %: 68.2 % (ref 43.0–77.0)
RBC: 4.53 Mil/uL (ref 3.87–5.11)
WBC: 5.4 10*3/uL (ref 4.5–10.5)

## 2011-01-22 LAB — HEPATIC FUNCTION PANEL
ALT: 31 U/L (ref 0–35)
AST: 23 U/L (ref 0–37)
Albumin: 3.4 g/dL — ABNORMAL LOW (ref 3.5–5.2)
Alkaline Phosphatase: 45 U/L (ref 39–117)
Bilirubin, Direct: 0.1 mg/dL (ref 0.0–0.3)
Total Protein: 6.6 g/dL (ref 6.0–8.3)

## 2011-01-22 LAB — LIPID PANEL
LDL Cholesterol: 92 mg/dL (ref 0–99)
Total CHOL/HDL Ratio: 4
Triglycerides: 36 mg/dL (ref 0.0–149.0)

## 2011-01-22 LAB — BASIC METABOLIC PANEL
CO2: 31 mEq/L (ref 19–32)
Calcium: 9.3 mg/dL (ref 8.4–10.5)
Creatinine, Ser: 1 mg/dL (ref 0.4–1.2)
Sodium: 142 mEq/L (ref 135–145)

## 2011-01-25 ENCOUNTER — Encounter: Payer: Self-pay | Admitting: Internal Medicine

## 2011-01-25 NOTE — Assessment & Plan Note (Signed)
No recurrent sxs She has f/u with ID clinic

## 2011-01-25 NOTE — Assessment & Plan Note (Signed)
She would do remarkably better with aggressive weight loss.

## 2011-01-25 NOTE — Progress Notes (Signed)
  Subjective:    Patient ID: Diane Frey, female    DOB: 02-17-1941, 70 y.o.   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 She has chronic SOB but she thinks improving. Appetite is good.   HTN---tolerating meds and is compliant with all of her meds  Dementia--seeing neurolgy at [facility]  Lipids---tolerating meds  Past Medical History  Diagnosis Date  . CORONARY ARTERY DISEASE 02/14/2007  . CVA 02/10/2008  . DEMENTIA 01/20/2010  . ENDOCARDITIS 09/29/2010  . HYPERLIPIDEMIA 02/14/2007  . HYPERTENSION 02/14/2007  . MYOCARDIAL INFARCTION, HX OF 02/14/2007  . OBESITY 01/16/2009  . OBSTRUCTIVE SLEEP APNEA 05/27/2007  . 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 Tubal ligation     reports that she has never smoked. She has never used smokeless tobacco. She reports that she does not drink alcohol or use illicit drugs. family history includes Heart failure in her mother and Kidney disease in her brother.    No Known Allergies      Review of Systems  patient denies chest pain,  orthopnea. Denies lower extremity edema, abdominal pain, change in appetite, change in bowel movements. Patient denies rashes, musculoskeletal complaints. No other specific complaints in a complete review of systems.      Objective:   Physical Exam  Well-developed well-nourished female in no acute distress. HEENT exam atraumatic, normocephalic, extraocular muscles are intact. Neck is supple. No jugular venous distention no thyromegaly. Chest clear to auscultation without increased work of breathing. Cardiac exam S1 and S2 are regular. Abdominal exam active bowel sounds, soft, nontender. Extremities no edema. Neurologic exam she is alert without any motor sensory deficits. Gait is normal.        Assessment & Plan:

## 2011-01-25 NOTE — Assessment & Plan Note (Signed)
No sxs Continue current meds 

## 2011-01-25 NOTE — Assessment & Plan Note (Signed)
She has completed antibiotics She has f/u with ID clinic

## 2011-01-26 ENCOUNTER — Telehealth: Payer: Self-pay | Admitting: Internal Medicine

## 2011-01-26 MED ORDER — MEMANTINE HCL 5 MG PO TABS: 5.0000 mg | ORAL_TABLET | Freq: Two times a day (BID) | ORAL | Status: DC

## 2011-01-26 NOTE — Telephone Encounter (Signed)
Daughter has question concerning restarting namenda

## 2011-01-26 NOTE — Telephone Encounter (Signed)
Needs 13 pills until the 2/28.  Rx sent into pharmacy electronically

## 2011-02-15 ENCOUNTER — Other Ambulatory Visit: Payer: Self-pay | Admitting: Internal Medicine

## 2011-02-16 LAB — CREATININE, SERUM
GFR calc Af Amer: >60 mL/min (ref >60)
GFR calc non Af Amer: 57 mL/min — ABNORMAL LOW (ref >60)

## 2011-02-17 ENCOUNTER — Other Ambulatory Visit: Payer: Self-pay | Admitting: Internal Medicine

## 2011-02-18 LAB — BASIC METABOLIC PANEL
CO2: 25 mEq/L (ref 19–32)
Calcium: 8 mg/dL — ABNORMAL LOW (ref 8.4–10.5)
Chloride: 108 mEq/L (ref 96–112)
GFR calc Af Amer: >60 mL/min (ref >60)
GFR calc non Af Amer: >60 mL/min (ref >60)
GFR calc non Af Amer: >60 mL/min (ref >60)
Glucose, Bld: 92 mg/dL (ref 70–99)
Potassium: 4.2 mEq/L (ref 3.5–5.1)
Potassium: 3.5 mEq/L (ref 3.5–5.1)
Sodium: 141 mEq/L (ref 135–145)
Sodium: 140 mEq/L (ref 135–145)

## 2011-02-18 LAB — CBC
HCT: 35.1 % — ABNORMAL LOW (ref 36.0–46.0)
HCT: 28.5 % — ABNORMAL LOW (ref 36.0–46.0)
HCT: 29.8 % — ABNORMAL LOW (ref 36.0–46.0)
HCT: 30.6 % — ABNORMAL LOW (ref 36.0–46.0)
Hemoglobin: 10.7 g/dL — ABNORMAL LOW (ref 12.0–15.0)
Hemoglobin: 9.4 g/dL — ABNORMAL LOW (ref 12.0–15.0)
MCH: 28.1 pg (ref 26.0–34.0)
MCH: 28.1 pg (ref 26.0–34.0)
MCH: 28.4 pg (ref 26.0–34.0)
MCHC: 31.9 g/dL (ref 30.0–36.0)
MCHC: 32.9 g/dL (ref 30.0–36.0)
MCV: 88.9 fL (ref 78.0–100.0)
MCV: 86.5 fL (ref 78.0–100.0)
MCV: 87.8 fL (ref 78.0–100.0)
Platelets: 267 10*3/uL (ref 150–400)
Platelets: 267 10*3/uL (ref 150–400)
Platelets: 269 10*3/uL (ref 150–400)
RBC: 3.33 MIL/uL — ABNORMAL LOW (ref 3.87–5.11)
RBC: 3.44 MIL/uL — ABNORMAL LOW (ref 3.87–5.11)
RDW: 16.9 % — ABNORMAL HIGH (ref 11.5–15.5)
WBC: 8.2 10*3/uL (ref 4.0–10.5)
WBC: 8.3 10*3/uL (ref 4.0–10.5)

## 2011-02-18 LAB — URINALYSIS, ROUTINE W REFLEX MICROSCOPIC
Bilirubin Urine: NEGATIVE
Glucose, UA: NEGATIVE mg/dL
Glucose, UA: NEGATIVE mg/dL
Hgb urine dipstick: NEGATIVE
Ketones, ur: NEGATIVE mg/dL
Specific Gravity, Urine: 1.022 (ref 1.005–1.030)
pH: 7 (ref 5.0–8.0)
pH: 7.5 (ref 5.0–8.0)

## 2011-02-18 LAB — CULTURE, BLOOD (ROUTINE X 2)
Culture  Setup Time: 201110280205
Culture  Setup Time: 201110280206
Culture: NO GROWTH

## 2011-02-18 LAB — DIFFERENTIAL
Basophils Absolute: 0 10*3/uL (ref 0.0–0.1)
Basophils Relative: 0 % (ref 0–1)
Eosinophils Relative: 3 % (ref 0–5)
Lymphocytes Relative: 26 % (ref 12–46)
Lymphocytes Relative: 17 % (ref 12–46)
Monocytes Absolute: 0.7 10*3/uL (ref 0.1–1.0)
Monocytes Absolute: 0.5 10*3/uL (ref 0.1–1.0)
Monocytes Relative: 8 % (ref 3–12)
Monocytes Relative: 6 % (ref 3–12)
Neutro Abs: 5.2 10*3/uL (ref 1.7–7.7)
Neutro Abs: 6.2 10*3/uL (ref 1.7–7.7)
Neutrophils Relative %: 74 % (ref 43–77)

## 2011-02-18 LAB — CATH TIP CULTURE: Culture: NO GROWTH

## 2011-02-18 LAB — COMPREHENSIVE METABOLIC PANEL
AST: 30 U/L (ref 0–37)
Albumin: 2.5 g/dL — ABNORMAL LOW (ref 3.5–5.2)
Albumin: 2.5 g/dL — ABNORMAL LOW (ref 3.5–5.2)
Albumin: 2.6 g/dL — ABNORMAL LOW (ref 3.5–5.2)
Alkaline Phosphatase: 40 U/L (ref 39–117)
Alkaline Phosphatase: 40 U/L (ref 39–117)
BUN: 10 mg/dL (ref 6–23)
BUN: 9 mg/dL (ref 6–23)
BUN: 9 mg/dL (ref 6–23)
Calcium: 8.5 mg/dL (ref 8.4–10.5)
Calcium: 8.6 mg/dL (ref 8.4–10.5)
Chloride: 107 mEq/L (ref 96–112)
Creatinine, Ser: 0.95 mg/dL (ref 0.4–1.2)
Creatinine, Ser: 1.04 mg/dL (ref 0.4–1.2)
Creatinine, Ser: 1.07 mg/dL (ref 0.4–1.2)
GFR calc Af Amer: >60 mL/min (ref >60)
GFR calc non Af Amer: 51 mL/min — ABNORMAL LOW (ref >60)
GFR calc non Af Amer: 53 mL/min — ABNORMAL LOW (ref >60)
Glucose, Bld: 89 mg/dL (ref 70–99)
Glucose, Bld: 97 mg/dL (ref 70–99)
Potassium: 3.9 mEq/L (ref 3.5–5.1)
Potassium: 4.1 mEq/L (ref 3.5–5.1)
Total Bilirubin: 0.4 mg/dL (ref 0.3–1.2)
Total Protein: 5.8 g/dL — ABNORMAL LOW (ref 6.0–8.3)

## 2011-02-18 LAB — URINE CULTURE
Colony Count: >=100000
Culture  Setup Time: 201110280115
Special Requests: NEGATIVE

## 2011-02-18 LAB — URINE MICROSCOPIC-ADD ON

## 2011-02-19 LAB — CBC
HCT: 31.9 % — ABNORMAL LOW (ref 36.0–46.0)
HCT: 31.1 % — ABNORMAL LOW (ref 36.0–46.0)
HCT: 33.6 % — ABNORMAL LOW (ref 36.0–46.0)
HCT: 35.5 % — ABNORMAL LOW (ref 36.0–46.0)
HCT: 38.6 % (ref 36.0–46.0)
Hemoglobin: 10.3 g/dL — ABNORMAL LOW (ref 12.0–15.0)
Hemoglobin: 11 g/dL — ABNORMAL LOW (ref 12.0–15.0)
Hemoglobin: 11.1 g/dL — ABNORMAL LOW (ref 12.0–15.0)
MCH: 29.3 pg (ref 26.0–34.0)
MCH: 29 pg (ref 26.0–34.0)
MCH: 29 pg (ref 26.0–34.0)
MCH: 29.4 pg (ref 26.0–34.0)
MCHC: 33 g/dL (ref 30.0–36.0)
MCHC: 33.1 g/dL (ref 30.0–36.0)
MCHC: 33.3 g/dL (ref 30.0–36.0)
MCV: 88.4 fL (ref 78.0–100.0)
MCV: 87.7 fL (ref 78.0–100.0)
MCV: 88.5 fL (ref 78.0–100.0)
MCV: 87.7 fL (ref 78.0–100.0)
Platelets: 247 10*3/uL (ref 150–400)
RBC: 3.61 MIL/uL — ABNORMAL LOW (ref 3.87–5.11)
RBC: 3.66 MIL/uL — ABNORMAL LOW (ref 3.87–5.11)
RBC: 3.79 MIL/uL — ABNORMAL LOW (ref 3.87–5.11)
RDW: 15.1 % (ref 11.5–15.5)
RDW: 15.4 % (ref 11.5–15.5)
RDW: 15 % (ref 11.5–15.5)
WBC: 6.7 10*3/uL (ref 4.0–10.5)
WBC: 5.5 10*3/uL (ref 4.0–10.5)
WBC: 5.7 10*3/uL (ref 4.0–10.5)

## 2011-02-19 LAB — COMPREHENSIVE METABOLIC PANEL
ALT: 191 U/L — ABNORMAL HIGH (ref 0–35)
ALT: 320 U/L — ABNORMAL HIGH (ref 0–35)
ALT: 456 U/L — ABNORMAL HIGH (ref 0–35)
AST: 342 U/L — ABNORMAL HIGH (ref 0–37)
Albumin: 2.7 g/dL — ABNORMAL LOW (ref 3.5–5.2)
Albumin: 2.8 g/dL — ABNORMAL LOW (ref 3.5–5.2)
Alkaline Phosphatase: 36 U/L — ABNORMAL LOW (ref 39–117)
Alkaline Phosphatase: 45 U/L (ref 39–117)
Alkaline Phosphatase: 49 U/L (ref 39–117)
Alkaline Phosphatase: 60 U/L (ref 39–117)
BUN: 13 mg/dL (ref 6–23)
BUN: 13 mg/dL (ref 6–23)
BUN: 12 mg/dL (ref 6–23)
CO2: 25 mEq/L (ref 19–32)
CO2: 26 mEq/L (ref 19–32)
CO2: 28 mEq/L (ref 19–32)
Calcium: 8.6 mg/dL (ref 8.4–10.5)
Calcium: 8.8 mg/dL (ref 8.4–10.5)
Calcium: 9.4 mg/dL (ref 8.4–10.5)
Chloride: 107 mEq/L (ref 96–112)
Chloride: 107 mEq/L (ref 96–112)
Creatinine, Ser: 0.85 mg/dL (ref 0.4–1.2)
Creatinine, Ser: 0.89 mg/dL (ref 0.4–1.2)
GFR calc Af Amer: >60 mL/min (ref >60)
GFR calc Af Amer: >60 mL/min (ref >60)
GFR calc non Af Amer: >60 mL/min (ref >60)
GFR calc non Af Amer: >60 mL/min (ref >60)
GFR calc non Af Amer: >60 mL/min (ref >60)
Glucose, Bld: 79 mg/dL (ref 70–99)
Glucose, Bld: 82 mg/dL (ref 70–99)
Glucose, Bld: 94 mg/dL (ref 70–99)
Glucose, Bld: 88 mg/dL (ref 70–99)
Potassium: 3.8 mEq/L (ref 3.5–5.1)
Potassium: 3.8 mEq/L (ref 3.5–5.1)
Potassium: 3.9 mEq/L (ref 3.5–5.1)
Potassium: 4.5 mEq/L (ref 3.5–5.1)
Sodium: 138 mEq/L (ref 135–145)
Sodium: 139 mEq/L (ref 135–145)
Sodium: 140 mEq/L (ref 135–145)
Total Bilirubin: 0.8 mg/dL (ref 0.3–1.2)
Total Protein: 5.8 g/dL — ABNORMAL LOW (ref 6.0–8.3)
Total Protein: 6.2 g/dL (ref 6.0–8.3)
Total Protein: 7.4 g/dL (ref 6.0–8.3)

## 2011-02-19 LAB — URINALYSIS, ROUTINE W REFLEX MICROSCOPIC
Glucose, UA: NEGATIVE mg/dL
Hgb urine dipstick: NEGATIVE
Protein, ur: NEGATIVE mg/dL
Specific Gravity, Urine: 1.012 (ref 1.005–1.030)
pH: 5 (ref 5.0–8.0)

## 2011-02-19 LAB — DIFFERENTIAL
Basophils Relative: 0 % (ref 0–1)
Eosinophils Absolute: 0.1 10*3/uL (ref 0.0–0.7)
Lymphocytes Relative: 28 % (ref 12–46)
Lymphs Abs: 1.8 10*3/uL (ref 0.7–4.0)
Monocytes Relative: 4 % (ref 3–12)
Neutro Abs: 3.8 10*3/uL (ref 1.7–7.7)
Neutro Abs: 5.5 10*3/uL (ref 1.7–7.7)
Neutrophils Relative %: 61 % (ref 43–77)
Neutrophils Relative %: 74 % (ref 43–77)

## 2011-02-19 LAB — HEPATIC FUNCTION PANEL
ALT: 258 U/L — ABNORMAL HIGH (ref 0–35)
Albumin: 2.9 g/dL — ABNORMAL LOW (ref 3.5–5.2)
Bilirubin, Direct: 0.3 mg/dL (ref 0.0–0.3)
Indirect Bilirubin: 0.3 mg/dL (ref 0.3–0.9)
Indirect Bilirubin: 0.8 mg/dL (ref 0.3–0.9)
Total Bilirubin: 0.6 mg/dL (ref 0.3–1.2)
Total Protein: 6.7 g/dL (ref 6.0–8.3)

## 2011-02-19 LAB — BASIC METABOLIC PANEL
BUN: 11 mg/dL (ref 6–23)
BUN: 12 mg/dL (ref 6–23)
CO2: 26 mEq/L (ref 19–32)
CO2: 29 mEq/L (ref 19–32)
Calcium: 8.6 mg/dL (ref 8.4–10.5)
Calcium: 8.8 mg/dL (ref 8.4–10.5)
Chloride: 106 mEq/L (ref 96–112)
Creatinine, Ser: 0.87 mg/dL (ref 0.4–1.2)
GFR calc Af Amer: >60 mL/min (ref >60)
Glucose, Bld: 107 mg/dL — ABNORMAL HIGH (ref 70–99)
Glucose, Bld: 89 mg/dL (ref 70–99)
Potassium: 3.6 mEq/L (ref 3.5–5.1)
Potassium: 3.8 mEq/L (ref 3.5–5.1)
Sodium: 140 mEq/L (ref 135–145)

## 2011-02-19 LAB — CATH TIP CULTURE: Culture: NO GROWTH

## 2011-02-19 LAB — CK: Total CK: 22 U/L (ref 7–177)

## 2011-02-19 LAB — URINE CULTURE
Colony Count: NO GROWTH
Culture  Setup Time: 201109010050
Culture: NO GROWTH

## 2011-02-19 LAB — CULTURE, BLOOD (ROUTINE X 2)
Culture: NO GROWTH
Culture: NO GROWTH

## 2011-02-19 LAB — MRSA PCR SCREENING: MRSA by PCR: NEGATIVE

## 2011-02-21 LAB — COMPREHENSIVE METABOLIC PANEL
ALT: 25 U/L (ref 0–35)
ALT: 26 U/L (ref 0–35)
ALT: 29 U/L (ref 0–35)
ALT: 30 U/L (ref 0–35)
AST: 26 U/L (ref 0–37)
Alkaline Phosphatase: 28 U/L — ABNORMAL LOW (ref 39–117)
Alkaline Phosphatase: 32 U/L — ABNORMAL LOW (ref 39–117)
Alkaline Phosphatase: 33 U/L — ABNORMAL LOW (ref 39–117)
BUN: 11 mg/dL (ref 6–23)
BUN: 13 mg/dL (ref 6–23)
BUN: 15 mg/dL (ref 6–23)
BUN: 18 mg/dL (ref 6–23)
CO2: 22 mEq/L (ref 19–32)
CO2: 22 mEq/L (ref 19–32)
CO2: 23 mEq/L (ref 19–32)
CO2: 25 mEq/L (ref 19–32)
CO2: 26 mEq/L (ref 19–32)
Calcium: 8 mg/dL — ABNORMAL LOW (ref 8.4–10.5)
Chloride: 104 mEq/L (ref 96–112)
Chloride: 107 mEq/L (ref 96–112)
Chloride: 108 mEq/L (ref 96–112)
Chloride: 111 mEq/L (ref 96–112)
Creatinine, Ser: 0.95 mg/dL (ref 0.4–1.2)
Creatinine, Ser: 1.12 mg/dL (ref 0.4–1.2)
Creatinine, Ser: 1.29 mg/dL — ABNORMAL HIGH (ref 0.4–1.2)
GFR calc Af Amer: 46 mL/min — ABNORMAL LOW (ref >60)
GFR calc Af Amer: 50 mL/min — ABNORMAL LOW (ref >60)
GFR calc non Af Amer: 41 mL/min — ABNORMAL LOW (ref >60)
GFR calc non Af Amer: 49 mL/min — ABNORMAL LOW (ref >60)
GFR calc non Af Amer: 59 mL/min — ABNORMAL LOW (ref >60)
Glucose, Bld: 100 mg/dL — ABNORMAL HIGH (ref 70–99)
Glucose, Bld: 103 mg/dL — ABNORMAL HIGH (ref 70–99)
Glucose, Bld: 104 mg/dL — ABNORMAL HIGH (ref 70–99)
Glucose, Bld: 109 mg/dL — ABNORMAL HIGH (ref 70–99)
Glucose, Bld: 96 mg/dL (ref 70–99)
Potassium: 3.4 mEq/L — ABNORMAL LOW (ref 3.5–5.1)
Potassium: 3.7 mEq/L (ref 3.5–5.1)
Potassium: 3.8 mEq/L (ref 3.5–5.1)
Sodium: 136 mEq/L (ref 135–145)
Sodium: 137 mEq/L (ref 135–145)
Sodium: 140 mEq/L (ref 135–145)
Sodium: 140 mEq/L (ref 135–145)
Total Bilirubin: 0.3 mg/dL (ref 0.3–1.2)
Total Bilirubin: 0.5 mg/dL (ref 0.3–1.2)
Total Bilirubin: 0.7 mg/dL (ref 0.3–1.2)
Total Bilirubin: 0.8 mg/dL (ref 0.3–1.2)
Total Protein: 5.6 g/dL — ABNORMAL LOW (ref 6.0–8.3)
Total Protein: 5.6 g/dL — ABNORMAL LOW (ref 6.0–8.3)
Total Protein: 5.9 g/dL — ABNORMAL LOW (ref 6.0–8.3)

## 2011-02-21 LAB — DIFFERENTIAL
Band Neutrophils: 0 % (ref 0–10)
Basophils Absolute: 0 10*3/uL (ref 0.0–0.1)
Basophils Absolute: 0 10*3/uL (ref 0.0–0.1)
Basophils Relative: 0 % (ref 0–1)
Basophils Relative: 0 % (ref 0–1)
Blasts: 0 %
Eosinophils Absolute: 0 10*3/uL (ref 0.0–0.7)
Eosinophils Absolute: 0.1 10*3/uL (ref 0.0–0.7)
Eosinophils Absolute: 0.2 10*3/uL (ref 0.0–0.7)
Eosinophils Absolute: 0.2 10*3/uL (ref 0.0–0.7)
Lymphocytes Relative: 12 % (ref 12–46)
Lymphocytes Relative: 6 % — ABNORMAL LOW (ref 12–46)
Lymphs Abs: 0.6 10*3/uL — ABNORMAL LOW (ref 0.7–4.0)
Lymphs Abs: 0.6 10*3/uL — ABNORMAL LOW (ref 0.7–4.0)
Lymphs Abs: 0.9 10*3/uL (ref 0.7–4.0)
Monocytes Absolute: 1.1 10*3/uL — ABNORMAL HIGH (ref 0.1–1.0)
Monocytes Absolute: 1.1 10*3/uL — ABNORMAL HIGH (ref 0.1–1.0)
Monocytes Absolute: 1.2 10*3/uL — ABNORMAL HIGH (ref 0.1–1.0)
Monocytes Relative: 15 % — ABNORMAL HIGH (ref 3–12)
Neutro Abs: 8.9 10*3/uL — ABNORMAL HIGH (ref 1.7–7.7)
Neutrophils Relative %: 71 % (ref 43–77)
Neutrophils Relative %: 72 % (ref 43–77)
Neutrophils Relative %: 73 % (ref 43–77)
Neutrophils Relative %: 77 % (ref 43–77)
Neutrophils Relative %: 91 % — ABNORMAL HIGH (ref 43–77)
Neutrophils Relative %: 92 % — ABNORMAL HIGH (ref 43–77)
Promyelocytes Absolute: 0 %
nRBC: 0 /100 WBC

## 2011-02-21 LAB — CBC
HCT: 32.9 % — ABNORMAL LOW (ref 36.0–46.0)
HCT: 33.1 % — ABNORMAL LOW (ref 36.0–46.0)
HCT: 33.6 % — ABNORMAL LOW (ref 36.0–46.0)
HCT: 35 % — ABNORMAL LOW (ref 36.0–46.0)
Hemoglobin: 10.8 g/dL — ABNORMAL LOW (ref 12.0–15.0)
Hemoglobin: 11 g/dL — ABNORMAL LOW (ref 12.0–15.0)
Hemoglobin: 11 g/dL — ABNORMAL LOW (ref 12.0–15.0)
Hemoglobin: 11.1 g/dL — ABNORMAL LOW (ref 12.0–15.0)
Hemoglobin: 11.6 g/dL — ABNORMAL LOW (ref 12.0–15.0)
Hemoglobin: 13.3 g/dL (ref 12.0–15.0)
MCH: 30.3 pg (ref 26.0–34.0)
MCH: 30.6 pg (ref 26.0–34.0)
MCH: 30.9 pg (ref 26.0–34.0)
MCH: 31.7 pg (ref 26.0–34.0)
MCHC: 33.3 g/dL (ref 30.0–36.0)
MCHC: 34.1 g/dL (ref 30.0–36.0)
MCHC: 34.6 g/dL (ref 30.0–36.0)
MCV: 90.5 fL (ref 78.0–100.0)
MCV: 91.4 fL (ref 78.0–100.0)
MCV: 91.8 fL (ref 78.0–100.0)
MCV: 92.2 fL (ref 78.0–100.0)
Platelets: 190 10*3/uL (ref 150–400)
RBC: 3.56 MIL/uL — ABNORMAL LOW (ref 3.87–5.11)
RBC: 3.59 MIL/uL — ABNORMAL LOW (ref 3.87–5.11)
RBC: 3.64 MIL/uL — ABNORMAL LOW (ref 3.87–5.11)
RBC: 4.18 MIL/uL (ref 3.87–5.11)
RDW: 15.3 % (ref 11.5–15.5)
RDW: 15.3 % (ref 11.5–15.5)
RDW: 15.5 % (ref 11.5–15.5)
WBC: 11.4 10*3/uL — ABNORMAL HIGH (ref 4.0–10.5)
WBC: 8.1 10*3/uL (ref 4.0–10.5)
WBC: 8.5 10*3/uL (ref 4.0–10.5)

## 2011-02-21 LAB — MAGNESIUM
Magnesium: 1.9 mg/dL (ref 1.5–2.5)
Magnesium: 1.9 mg/dL (ref 1.5–2.5)
Magnesium: 2 mg/dL (ref 1.5–2.5)

## 2011-02-21 LAB — CULTURE, BLOOD (ROUTINE X 2)

## 2011-02-21 LAB — URINALYSIS, ROUTINE W REFLEX MICROSCOPIC
Bilirubin Urine: NEGATIVE
Glucose, UA: NEGATIVE mg/dL
Leukocytes, UA: NEGATIVE
Nitrite: NEGATIVE
Protein, ur: 30 mg/dL — AB
Specific Gravity, Urine: 1.014 (ref 1.005–1.030)
Urobilinogen, UA: 2 mg/dL — ABNORMAL HIGH (ref 0.0–1.0)
pH: 5 (ref 5.0–8.0)

## 2011-02-21 LAB — URINE MICROSCOPIC-ADD ON

## 2011-02-21 LAB — CLOSTRIDIUM DIFFICILE EIA: C difficile Toxins A+B, EIA: NEGATIVE

## 2011-02-21 LAB — BASIC METABOLIC PANEL
BUN: 12 mg/dL (ref 6–23)
CO2: 26 mEq/L (ref 19–32)
Calcium: 8.3 mg/dL — ABNORMAL LOW (ref 8.4–10.5)
Creatinine, Ser: 1.05 mg/dL (ref 0.4–1.2)
Glucose, Bld: 102 mg/dL — ABNORMAL HIGH (ref 70–99)

## 2011-02-21 LAB — URINE CULTURE: Colony Count: NO GROWTH

## 2011-02-21 LAB — OVA AND PARASITE EXAMINATION

## 2011-02-21 LAB — STOOL CULTURE

## 2011-02-21 LAB — LACTIC ACID, PLASMA: Lactic Acid, Venous: 1.6 mmol/L (ref 0.5–2.2)

## 2011-03-01 ENCOUNTER — Other Ambulatory Visit: Payer: Self-pay | Admitting: Internal Medicine

## 2011-03-02 ENCOUNTER — Other Ambulatory Visit: Payer: Self-pay | Admitting: Internal Medicine

## 2011-03-03 ENCOUNTER — Telehealth: Payer: Self-pay | Admitting: Internal Medicine

## 2011-03-03 DIAGNOSIS — F068 Other specified mental disorders due to known physiological condition: Secondary | ICD-10-CM

## 2011-03-03 MED ORDER — MEMANTINE HCL 10 MG PO TABS: 10.0000 mg | ORAL_TABLET | Freq: Two times a day (BID) | ORAL | Status: DC

## 2011-03-03 NOTE — Telephone Encounter (Signed)
Pts daughter called and said her mom is out of Namenda 5 mg. Pt used to be on 10mg  and now it has been decreased to 5 mg. Pts daughter wanted to know if this has been changed or is it suppose to be 10mg ? [facility]s call in to [facility]

## 2011-03-03 NOTE — Telephone Encounter (Signed)
Changed rx to 10mg  bid and sent in electronically, pt's daughter aware

## 2011-03-04 ENCOUNTER — Ambulatory Visit (INDEPENDENT_AMBULATORY_CARE_PROVIDER_SITE_OTHER): Payer: Medicare (Managed Care) | Admitting: Internal Medicine

## 2011-03-04 ENCOUNTER — Encounter: Payer: Self-pay | Admitting: Internal Medicine

## 2011-03-04 VITALS — BP 122/84 | Temp 98.2°F | Wt 239.0 lb

## 2011-03-04 DIAGNOSIS — R609 Edema, unspecified: Secondary | ICD-10-CM | POA: Insufficient documentation

## 2011-03-04 LAB — BASIC METABOLIC PANEL
Calcium: 9.7 mg/dL (ref 8.4–10.5)
GFR: 83.25 mL/min (ref >60.00)
Glucose, Bld: 84 mg/dL (ref 70–99)
Sodium: 144 mEq/L (ref 135–145)

## 2011-03-04 LAB — HEPATIC FUNCTION PANEL
Albumin: 3.5 g/dL (ref 3.5–5.2)
Alkaline Phosphatase: 49 U/L (ref 39–117)
Bilirubin, Direct: 0.1 mg/dL (ref 0.0–0.3)

## 2011-03-04 NOTE — Assessment & Plan Note (Addendum)
Edema. Likely multifactorial; meds, weight, complicated cardiac issues. Before changing medications I'll check some laboratory work. This was discussed with the patient and her family member. Continue current diuretics for the time being.

## 2011-03-04 NOTE — Progress Notes (Signed)
  Subjective:    Patient ID: Diane Frey, female    DOB: [birth year], 70 y.o.   MRN: [id number]  HPI Pt brought in by family member for acute problem related to LE edema Duration 1 week Typically related to feet and ankles---never as severe as mid calf No SOB Note weight loss .   Past Medical History  Diagnosis Date  . CORONARY ARTERY DISEASE 02/14/2007  . CVA 02/10/2008  . DEMENTIA 01/20/2010  . ENDOCARDITIS 09/29/2010  . HYPERLIPIDEMIA 02/14/2007  . HYPERTENSION 02/14/2007  . MYOCARDIAL INFARCTION, HX OF 02/14/2007  . OBESITY 01/16/2009  . OBSTRUCTIVE SLEEP APNEA 05/27/2007  . 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 Tubal ligation     reports that she has never smoked. She has never used smokeless tobacco. She reports that she does not drink alcohol or use illicit drugs. family history includes Heart failure in her mother and Kidney disease in her brother. No Known Allergies    Review of Systems  patient denies chest pain, shortness of breath, orthopnea. Denies lower extremity edema, abdominal pain, change in appetite, change in bowel movements. Patient denies rashes, musculoskeletal complaints. No other specific complaints in a complete review of systems.      Objective:   Physical Exam  Well-developed well-nourished female in no acute distress. HEENT exam atraumatic, normocephalic, extraocular muscles are intact. Neck is supple. No jugular venous distention no thyromegaly. Chest clear to auscultation without increased work of breathing. Cardiac exam S1 and S2 are regular. Abdominal exam active bowel sounds, soft, nontender. Extremities with 2 + edema to ankles and feet. Neurologic exam she is alert without any motor sensory deficits. Gait -uses walker        Assessment & Plan:

## 2011-03-13 LAB — URINALYSIS, ROUTINE W REFLEX MICROSCOPIC
Bilirubin Urine: NEGATIVE
Ketones, ur: NEGATIVE mg/dL
Protein, ur: NEGATIVE mg/dL
Urobilinogen, UA: 0.2 mg/dL (ref 0.0–1.0)

## 2011-03-13 LAB — CBC
Hemoglobin: 10 g/dL — ABNORMAL LOW (ref 12.0–15.0)
Hemoglobin: 10.8 g/dL — ABNORMAL LOW (ref 12.0–15.0)
MCHC: 32.2 g/dL (ref 30.0–36.0)
MCHC: 33.4 g/dL (ref 30.0–36.0)
MCV: 85.4 fL (ref 78.0–100.0)
RBC: 3.48 MIL/uL — ABNORMAL LOW (ref 3.87–5.11)
RBC: 3.91 MIL/uL (ref 3.87–5.11)

## 2011-03-13 LAB — BASIC METABOLIC PANEL
BUN: 8 mg/dL (ref 6–23)
CO2: 29 mEq/L (ref 19–32)
Calcium: 9 mg/dL (ref 8.4–10.5)
Creatinine, Ser: 0.94 mg/dL (ref 0.4–1.2)
GFR calc Af Amer: >60 mL/min (ref >60)
Glucose, Bld: 156 mg/dL — ABNORMAL HIGH (ref 70–99)

## 2011-03-15 LAB — BASIC METABOLIC PANEL
CO2: 25 mEq/L (ref 19–32)
Glucose, Bld: 94 mg/dL (ref 70–99)
Potassium: 3.6 mEq/L (ref 3.5–5.1)
Sodium: 139 mEq/L (ref 135–145)

## 2011-03-15 LAB — CBC
HCT: 32.8 % — ABNORMAL LOW (ref 36.0–46.0)
Hemoglobin: 11 g/dL — ABNORMAL LOW (ref 12.0–15.0)
MCHC: 33.6 g/dL (ref 30.0–36.0)
RBC: 3.64 MIL/uL — ABNORMAL LOW (ref 3.87–5.11)
RDW: 14.9 % (ref 11.5–15.5)

## 2011-03-15 LAB — URINALYSIS, ROUTINE W REFLEX MICROSCOPIC
Nitrite: NEGATIVE
Specific Gravity, Urine: 1.02 (ref 1.005–1.030)
pH: 7 (ref 5.0–8.0)

## 2011-03-15 LAB — URINE MICROSCOPIC-ADD ON

## 2011-03-15 LAB — APTT: aPTT: 24 seconds (ref 24–37)

## 2011-03-23 ENCOUNTER — Other Ambulatory Visit: Payer: Self-pay | Admitting: Internal Medicine

## 2011-04-10 ENCOUNTER — Other Ambulatory Visit: Payer: Self-pay | Admitting: *Deleted

## 2011-04-10 DIAGNOSIS — I10 Essential (primary) hypertension: Secondary | ICD-10-CM

## 2011-04-10 MED ORDER — FERROUS SULFATE 325 (65 FE) MG PO TABS: 325.0000 mg | ORAL_TABLET | Freq: Two times a day (BID) | ORAL | Status: DC

## 2011-04-10 MED ORDER — CARVEDILOL 25 MG PO TABS: 25.0000 mg | ORAL_TABLET | Freq: Two times a day (BID) | ORAL | Status: DC

## 2011-04-13 ENCOUNTER — Other Ambulatory Visit: Payer: Self-pay | Admitting: *Deleted

## 2011-04-13 MED ORDER — POTASSIUM CHLORIDE CRYS ER 20 MEQ PO TBCR: 20.0000 meq | EXTENDED_RELEASE_TABLET | Freq: Every day | ORAL | Status: DC

## 2011-04-21 ENCOUNTER — Other Ambulatory Visit: Payer: Self-pay | Admitting: *Deleted

## 2011-04-21 MED ORDER — FUROSEMIDE 40 MG PO TABS: 40.0000 mg | ORAL_TABLET | Freq: Every day | ORAL | Status: DC

## 2011-04-21 NOTE — Discharge Summary (Signed)
NAME:  Diane Frey, Diane Frey NO.:  [id number]   MEDICAL RECORD NO.:  [id number]          PATIENT TYPE:  INP   LOCATION:  5501                         FACILITY:  [facility]   PHYSICIAN:  Diane Frey, MDDATE OF BIRTH:  08-Aug-1941   DATE OF ADMISSION:  02/01/2008  DATE OF DISCHARGE:  02/03/2008                               DISCHARGE SUMMARY   DISCHARGE DIAGNOSES:  1. Bilateral cerebral vascular accidents, right greater than left      secondary to embolic shower from aorta during angio performed two      days prior to this admission.  Status post neurology evaluation per      Dr. Marcelino Frey.  2. Vaginal bleeding in postmenopausal female.  The patient is      hemodynamically stable but will need outpatient workup.  Defer to      the patient's primary M.D.  3. Cerebral aneurysm status post four-vessel arteriogram at [facility] performed on February 01, 2008.  Surgery has been postponed      per Dr. Jonna Frey secondary to acute cerebral vascular accidents.  4. History of coronary artery disease.  5. Hypertension.  6. Chronic obstructive pulmonary disease.  7. Gastroesophageal reflux disease.  8. Osteoarthritis.   HISTORY OF PRESENT ILLNESS:  Diane Frey is a 70 year old, African-  American female who was admitted on February 01, 2008 with chief  complaint of confusion.  She had undergone a procedure at [facility] which was  a four-vessel arteriogram of the head on February 01, 2008.  She  developed increased confusion and was admitted for further evaluation  and treatment.   COURSE OF HOSPITALIZATION:  1. Bilateral cerebral vascular accidents.  The patient was admitted.      A MRI of the brain was performed which noted several acute small      infarcts which were scattered throughout the hemispheres, right      greater than left, as well as in the right cerebellum -      questionable in the right cerebellum.  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  White matter changes were noted with partial      opacification of the ethmoid sinus air cells.  The patient was      initially confused on admission, and her symptoms have resolved.      She was seen by Dr. Marcelino Frey of neurology during this      admission, and she felt that the patient's transient confusion was      most likely secondary to embolic shower from the aorta during an      angio performed two days prior to this admission.  Her symptoms      have resolved.  She recommends no specific treatment at this time.      This is a complication of an invasive procedure (mechanical      instrumentation of the aorta), and her symptoms have resolved.      These results have been shared  with Dr. Vanita Frey and Dr.      Jonna Frey at [facility].  At this time per Dr. Jonna Frey, he will      postpone her surgery for approximately one month secondary to acute      CVA, and we will resume aspirin and Plavix at this time.  2. Vaginal bleeding in postmenopausal female.  She will need to      undergo outpatient ultrasound as well as likely GYN referral.      Defer to the patient's primary M.D.   MEDICATIONS:  At time of discharge:  1. Lasix 40 mg p.o. daily.  2. Lisinopril 40 mg p.o. daily.  3. Plavix 75 mg p.o. daily.  4. Aspirin 81 mg p.o. daily.  5. Coreg 25 mg p.o. b.i.d.  6. Protonix 40 mg p.o. daily.   FOLLOWUP:  The patient was instructed to follow up with Dr. Jonna Frey  and contact the office for an appointment and to follow up with Dr.  Birdie Frey on Friday, March 6 at 1:15 p.m.  She will need post-  hospital follow-up as well as assistance with arranging workup of  postmenopausal bleeding.  She is instructed to return to the ER should  she develop weakness, increased confusion, or visual changes.      Diane Craze, NP      Diane Rover. Felicity Coyer, MD  Electronically Signed    MO/MEDQ   D:  02/03/2008  T:  02/04/2008  Job:  [id number]   cc:   Diane Frey, M.D.  Diane Rexene Edison Swords, MD

## 2011-04-21 NOTE — Consult Note (Signed)
NAMEMarland Frey  [name], FONNER NO.:  [id number]   MEDICAL RECORD NO.:  [id number]          PATIENT TYPE:  OUT   LOCATION:  XRAY                         FACILITY:  [facility]   PHYSICIAN:  Sanjeev K. Deveshwar, M.D.DATE OF BIRTH:  25-Mar-1941   DATE OF CONSULTATION:  09/06/2007  DATE OF DISCHARGE:                                 CONSULTATION   DATE OF CONSULTATION:  September 06, 2007.   CHIEF COMPLAINT:  Cerebral aneurysm.   HISTORY OF PRESENT ILLNESS:  This is a as a pleasant 70 year old female  who was referred to Dr. Corliss Skains through the courtesy of Dr. Aura Camps.  The patient recently saw Dr. Karleen Hampshire for an eye exam on August 02, 2007.  Dr. Karleen Hampshire was concerned about the exam and ordered an MRI  which was performed on August 25, 2007.  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  The patient has a  history of  intracranial hemorrhage from February 2003.  It was felt  that the aneurysm may have leaked at that time which would account for  her hemorrhagic CVA.  She was also noted to have moderate  arteriosclerotic changes in the right posterior cerebral artery.  The  patient reports that she has frequent headaches.  They often occur when  she wakes up in the morning.  They are usually in the right parietal and  posterior area.  She denies any significant recent visual changes.  She  presents today accompanied by her daughter to be seen in consultation by  Dr. Corliss Skains   PAST MEDICAL HISTORY:  Significant for:  1. Hyperlipidemia.  2. The hemorrhagic CVA in 2003 evaluated by Dr. Marcelino Freestone.      Apparently she did not have a cerebral angiogram at that time.  3. She does have a history of hypertension.  4. History of coronary artery disease.  She had a heart      catheterization she believes in early 2000 at [facility].      She also has history of an MI.  5. She has COPD.  6. Gastroesophageal reflux  disease.  7. History of arthritis.   SURGICAL HISTORY:  Significant for a tubal ligation.  She denies any  previous problems with anesthesia.   ALLERGIES:  No known drug allergies.  She denies contrast dye, latex,  shrimp, or iodine allergies.   CURRENT MEDICATIONS:  1. Aspirin 81 mg coated one daily.  2. Lasix.  3. Lisinopril.  4. Metoprolol.  5. Micardis.  6. Protonix.  7. Simvastatin.  8. Nitroglycerin p.r.n.   SOCIAL HISTORY:  The patient is widowed.  She has six children.  She  lives in [place] with her daughter.  She states that she has never  been a smoker.  She does not use alcohol.  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  She  has a sister with a cerebral aneurysm.   IMPRESSION AND PLAN:  As noted, this  patient was referred to Dr.  Corliss Skains through the courtesy of Dr. Aura Camps for evaluation of  a cerebral aneurysm noted on MRI/MRA August 25, 2007.  Cerebral  aneurysms were discussed in detail with the patient and her daughter.  Dr. Corliss Skains reviewed the results of her MRI images with the patient.  He described treatment options for the aneurysm including continued  monitoring, open surgery, as well as endovascular stenting and/or  coiling.  The risks and benefits of the procedures were discussed. They  were also given some written materials to study at home. All of their  questions were answered.  Dr. Corliss Skains recommended that the patient go  home and give this some thought to see if she wanted to proceed with the  cerebral angiogram and possible intervention.  The patient felt at this  time that she would like to go ahead and schedule an intervention. Our  scheduler will contact the patient with potential dates.   Greater than 40 minutes was spent on this consult.      Delton See, P.A.    ______________________________  Grandville Silos.  Corliss Skains, M.D.    DR/MEDQ  D:  09/06/2007  T:  09/06/2007  Job:  [id number]   cc:   Valetta Mole. Swords, MD  Tyrone Apple. Karleen Hampshire, M.D.

## 2011-04-21 NOTE — Consult Note (Signed)
NAME:  Diane Frey, Diane Frey NO.:  [id number]   MEDICAL RECORD NO.:  [id number]          PATIENT TYPE:  INP   LOCATION:  5501                         FACILITY:  [facility]   PHYSICIAN:  Gustavus Messing. Orlin Hilding, M.D.DATE OF BIRTH:  Apr 13, 1941   DATE OF CONSULTATION:  02/03/2008  DATE OF DISCHARGE:  02/03/2008                                 CONSULTATION   CHIEF COMPLAINT:  Stroke.   HISTORY OF PRESENT ILLNESS:  Diane Frey is a 70 year old right-  handed African American woman who has a left frontal parasagittal AVM,  which was discovered after a hemorrhage in 2003.  As far as I can tell,  this has been relatively asymptomatic, although she has complained  intermittently of vision and headache problems.  Currently, the patient  says the headaches really have not been that much of a problem.  However, recently fall of 2008, she underwent a reevaluation of this  AVM.  She had an angiogram by Dr. Corliss Skains here locally in October of  2008 which demonstrated the AVM, and she was sent for a neurosurgical  evaluation, saw Dr. Alesia Morin  at [facility].  She was actually  scheduled to have a surgical resection of the AVM on Monday coming up,  March 2 at [facility], and in preparation for that underwent a 4-  vessel angiogram at [facility] on February 25.  Immediately following  that while at home, she was confused and just felt funny.  She had  trouble recalling the events that happened earlier, apparently became  fixated on wanting to eat.  Other than the altered mental status, there  was no other clear focal abnormality.  She was admitted for evaluation  and while admitted had an MRI scan performed which shows multiple small  bilateral embolic infarcts  and we had been consulted as a result.  Her  symptoms have actually resolved.   A comprehensive ten system review of systems was performed and is  negative, except as outlined in the past medical history including  no  current complaints of headaches, speech problems, vision changes, chest  pain, shortness of breath, nausea, vomiting, loss of consciousness or  falls.   PAST MEDICAL HISTORY:  Significant for the known left frontal AVM with a  history of hemorrhage in this region February of 2003.  No clear  residual symptoms from that.  She has hypertension, hyperlipidemia,  coronary artery disease with a history of MI, GERD, COPD and arthritis.  She has had a tubal ligation.   MEDICATIONS:  1. Nitroglycerin p.r.n.  2. Coreg 25 mg b.i.d.  3. Aspirin 81 mg daily, on hold in preparation for the angiogram and      planned surgical procedure.  4. Lisinopril 40 mg a day.  5. Zocor 40 mg a day.  6. Plavix 75 mg a day, on hold in preparation for the angiogram and      planned surgical procedure..  7. Tylenol as needed.  8. Protonix daily.  9. Lasix 40 mg daily.   ALLERGIES:  No known drug allergies.   SOCIAL HISTORY:  She lives at home  with her daughter.  No smoking,  alcohol or recreational drug use.   FAMILY HISTORY:  Positive for stroke.   PHYSICAL EXAMINATION:  Temperature is 98.4.  Blood pressure is 131/80.  Pulse 69.  Respirations 20, 96% on room air.  HEAD:  Normocephalic, atraumatic.  NECK:  Supple.  NEUROLOGIC:  Mental status:  She is oriented to hospital, city, county,  state, day, month, year and circumstances.  She names objects, repeats  and follows command.  She can spell worlds forwards, backwards; she  spelled it dlow, left out the r.  Cranial nerves:  Her pupils are equal  and react.  The visual fields are full to confrontation.  There is  slight hesitation to double simultaneous stimulation to the right lower  quadrant but not a true field cut.  Extraocular movements are intact  without nystagmus or ophthalmic paresis.  Facial sensation is normal.  Facial motor activity is normal.  Hearing is intact.  Palate is  symmetric and tongue is midline.  Motor exam:  There is no  drift.  She  has normal rapid fine movement.  Normal bulk, tone and strength  throughout.  Deep tendon reflexes are absent.  Coordination:  Finger to  nose, heel to shin normal.  She had an down going toes to plantar  stimulation.  Sensory exam is essentially normal except for mild  extinction to double simultaneous stimulation in the right lower  extremity.   Labs are unremarkable.   MRI scan of the brain shows chronic stable changes of the hemorrhagic  change in AVM in the left frontal parasagittal region, small vessel  disease, atrophy and ventriculomegaly unchanged and multiple tiny  bilateral acute infarcts consistent with an embolic shower from aorta or  heart.   IMPRESSION:  Transient confusion most likely secondary to embolic shower  from the aorta during instrumentation from the angiogram done 2 days  ago.  Her symptoms have resolved.   RECOMMENDATIONS:  No specific treatment is recommended.  This is a  complication of an invasive procedure with mechanical instrumentation of  her aorta.  Her symptoms have resolved; however her neurosurgeon and the  neuroradiologists at [facility] should be alerted as to this  complication of the procedure.  They  may wish to postpone her surgery.  If that is the case, she needs to restart her aspirin and Plavix.  There  is no further need from my point of view to do further neurologic work  up or stroke work up, as I believe this is most likely a direct  complication of instrumentation of her aorta, therefore, neurology will  sign off.      Catherine A. Orlin Hilding, M.D.  Electronically Signed     CAW/MEDQ  D:  02/03/2008  T:  02/04/2008  Job:  [id number]

## 2011-04-21 NOTE — H&P (Signed)
NAME:  Diane Frey, Diane Frey NO.:  [id number]   MEDICAL RECORD NO.:  [id number]          PATIENT TYPE:  EMS   LOCATION:  MAJO                         FACILITY:  [facility]   PHYSICIAN:  Hollice Espy, M.D.DATE OF BIRTH:  [birth year]   DATE OF ADMISSION:  02/01/2008  DATE OF DISCHARGE:                              HISTORY & PHYSICAL   PRIMARY CARE PHYSICIAN:  Valetta Mole. Swords, M.D.   CHIEF COMPLAINT:  Confusion.   HISTORY OF PRESENT ILLNESS:  The patient is a 70 year old African-  American female with past medical history of obesity, hypertension, and  CAD and COPD who was found in the past year to have  a large 7 x 6.5 mm  trilobed aneurysm of the brain that has led to leak and previous  hemorrhagic CVA.  The patient has been followed by [facility] and  she went today for an MRI with plans in the next few weeks to have  neurosurgery for treatment of this aneurysm.  Initially what was thought  was that the history was that the patient underwent the MRI with plans  of follow-up of the report in the next few days and at some point in the  afternoon she started having problems with confusion.  Specifically she  was drowsy, she would not recall proper events.  She claimed that she  had not eaten for 4-5 days even though she had just eaten this morning.  However, when the daughter who had cared for the patient this afternoon  spoke with the daughter who had driven the patient to [place] the  daughter from the morning stated that she thought that her mother had  been acting funny even prior to the MRI.  The MRI results are not known  at this time, but with problems increased confusion there is concern  that she may be having more bleeding.  She was brought into the  emergency room.  In the emergency room the CT scan of her head showed no  evidence of acute CVA.  The rest of her labs were done and she was found  to have a blood pressure of 165/68 which has since been  trended up to  175/85, but she is not hypoxic and all of her labs are actually  completely normal.  She has no signs of infection, dehydration,  electrolyte abnormalities, or hypoxia.  The only finding that they did  note was a large number of her red cells in her urine.  The patient  currently is still not back to baseline.  She is occasionally drowsy.  She slurs her speech and she does focus enough to answer questions, but  states that she has not eaten in days.  Currently she denies any  headaches, vision changes, dysphagia.  She denies any chest pain or  palpitations.  No shortness of breath, wheeze, or cough.  She states  that she is just hungry and that is the only thing she wants to do is  eat.  She denies any abdominal pain.  No constipation or diarrhea.  No  focal extremity numbness, weakness, or pain.  REVIEW OF SYSTEMS:  Otherwise negative.   PAST MEDICAL HISTORY:  Previous history of hemorrhagic CVA in 2003.  Trilobed cerebral aneurysm with leakage.  Hyperlipidemia, CAD with MI.  History of COPD, GERD, and arthritis.   MEDICATIONS:  1. Nitroglycerin p.r.n.  2. Coreg 25 mg b.i.d.  3. Aspirin 81 mg.  4. Lasix 40 mg.  5. Lisinopril 40 mg.  6. Zocor 40 mg.  7. Plavix 75 mg.  8. Tylenol p.r.n.  9. Protonix daily.   ALLERGIES:  No known drug allergies.   SOCIAL HISTORY:  No current tobacco, alcohol, or drug use.   FAMILY HISTORY:  Noncontributory.   PHYSICAL EXAMINATION:  VITAL SIGNS:  Temperature 98.4, heart rate 90,  blood pressure 165/68, respirations 19, O2 saturations 97% on room air.  GENERAL:  She is alert and oriented only x1.  When told that she is in  the hospital she does respond that she is in agreement but does not  appear to know that she is in the hospital on her own.  HEENT:  Normocephalic and atraumatic.  Mucous membranes are slightly  dry.  She has trouble following along.  CRANIAL NERVES:  She appears to have some slight droop to the right,   although I question how long this has been going on.  HEART:  Regular rate and rhythm.  S1 and S2.  LUNGS:  Clear to auscultation bilaterally.  ABDOMEN:  Soft, nontender, and nondistended.  Positive bowel sounds.  EXTREMITIES:  No clubbing or cyanosis.  Trace pitting edema.  Sensation  appears to be intact.  I have trouble getting a musculoskeletal  examination because she is drowsy and has a hard time following  commands.  So I did not find any focal neurologic deficits.   LABORATORY DATA:  White count 7.6, H&H 14 and 42, MCV 87, platelet count  242, no shift.  UA; hemoglobin 12-50 red cells, otherwise normal.  Sodium 140, potassium 4, chloride 105, bicarb 30, BUN 8, creatinine 1.0,  glucose 103.  ISTAT; pCO2 48.   ASSESSMENT:  1. Altered mental status.  The cause is not immediately clear.  The      patient certainly may be having some leakage from her aneurysm,      although it is not yet seen on the CT.  We will plan to admit to      the hospital, check an arterial blood gas to ensure that she does      not have any type of CO2 retention and then check and EEG and we      will try to get the results from [facility] in terms of her MRI      scan.  If this is negative, we will consider repeating it here.  2. History of coronary artery disease.  Continue Coreg, aspirin, and      Plavix.  3. Malignant hypertension.  Continue lisinopril and Lasix.  4. Chronic obstructive pulmonary disease.  See above, stable.      Hollice Espy, M.D.  Electronically Signed     SKK/MEDQ  D:  02/01/2008  T:  02/01/2008  Job:  [id number]   cc:   Valetta Mole. Swords, MD

## 2011-04-21 NOTE — Procedures (Signed)
EEG NUMBER:  [id number].   REFERRING PHYSICIAN:  Dr. Rito Ehrlich.   CLINICAL HISTORY:  This 70 year old patient is being evaluated for  altered mental status and confusion.   MEDICATIONS LISTED:  Lasix, Plavix, aspirin, Colace and Protonix.   This is a 17 channel routine EEG recorded with the patient awake and  drowsy using standard 10/20 electrode placement.   Background awake rhythm consists of 9-10 Hz alpha which is admixed with  some high frequency low amplitude beta activity.  Intermittent 6-7 Hz  theta slowing is seen bilaterally throughout most of the tracing.  Intermittent bilateral central and parietal sharp waves are seen but no  definite epileptiform features are noted.  She has stages of drowsiness  and light sleep achieved toward the end of the tracing and show normal  physiological findings.  Length of the recording is 23.4 minutes.  Technical component is average.  EKG tracing reveals regular sinus  rhythm.  Hyperventilation and photic stimulation are both unremarkable.   IMPRESSION:  This EEG performed during awake states and light sleep is  abnormal due to presence of bilateral mild cortical irritability and  mild bihemispheric dysfunction.  No definite epileptiform features were  noted.  If seizures are suspected a repeat sleep deprived study may be  beneficial.           ______________________________  Sunny Schlein. Pearlean Brownie, MD     ZOX:WRUE  D:  02/02/2008 18:41:40  T:  02/03/2008 17:00:57  Job #:  [id number]   cc:   Rito Ehrlich, Dr

## 2011-04-24 NOTE — H&P (Signed)
. [facility]  Patient:    Diane, Frey Visit Number: [id number] MRN: [id number]          Service Type: MED Location: [phone] Attending Physician:  Durel Salts Dictated by:   Gustavus Messing Orlin Hilding, M.D. Admit Date:  01/20/2002   CC:         [facility]   History and Physical  CHIEF COMPLAINT:  Headache, nausea, vomiting, malaise, and leg spasms.  HISTORY OF PRESENT ILLNESS:  Diane Frey is a 70 year old, right-handed woman with onset about one month ago of nausea, vomiting, malaise, and headache, gradually progressive until the past few days.  She has had increased lethargy, decreased energy, and leg spasms.  She says that she just does not feel like she can get up and get started and it is hard to walk. Over this past month, she has been seen three times in the [facility] Emergency Room and thought to have a viral infection.  She has also been seen at [facility], which is her primary care venue.  Because of continued symptoms and inability to keep the appointment today, she was brought by EMS to the [facility] Emergency Room.  She was supposed to have had a CT scan today, but was unable to get up and out of bed and go about her business of getting ready.  She was seen at [facility] and had a CT scan of the brain done there that showed a large left frontal parasagittal bleed of about 3-4 cm.  There was initially a question of tumor on the CT.  She was sent for MRI, which showed an organizing bleed with blood in all ventricles.  It was felt that tumor was less likely and that hypertension or amyloid etiology was most likely.  She has not had significant blood pressure elevations while in the emergency room.  She was transferred to [facility] to be in the neurological ICU.  REVIEW OF SYSTEMS:  She complains of some aching legs and no energy.  No chest pain or  shortness of breath.  Some nausea.  One of her daughters thought that she had an episode of responsiveness about a week ago.  PAST MEDICAL HISTORY:  Significant for hypertension and arthritis.  No diabetes.  No cancer.  She may have some kind of coronary artery disease, according to the daughter.  She has had a remote tubal ligation.  No other surgery.  MEDICATIONS: 1. Aspirin. 2. Vicodin. 3. Hydrochlorothiazide. 4. Celebrex. 5. Nifedipine. 6. Diazepam. 7. Zocor. 8. Pepcid.  ALLERGIES:  No known drug allergies.  SOCIAL HISTORY:  She is unmarried.  Generally has been living alone, but lately has been staying with a daughter.  She is on disability and is not working.  She does not smoke cigarettes or drink alcohol.  FAMILY HISTORY:  Positive for cancer, stroke, and diabetes.  PHYSICAL EXAMINATION:  VITAL SIGNS:  Temperature 98.9 degrees, pulse 105, blood pressure 130/85, respirations 16, 99% saturation on 2 L.  HEENT:  The head is normocephalic and atraumatic.  NECK:  Supple without bruits.  HEART:  Regular rate and rhythm.  LUNGS:  Clear to auscultation.  EXTREMITIES:  Without edema.  ABDOMEN:  She is obese.  The abdomen is nontender.  NEUROLOGIC:  Mental Status:  She is awake and alert.  She is oriented and can actually give me a fairly decent history, but she is a very placid and vague  historian.  She has sort of a ______ kind of affect.  Cranial Nerves:  Pupils were equal and reactive.  Cannot see the disk margins well.  Visual fields are full to confrontation.  Extraocular movements are intact.  Facial sensation is normal.  Facial motor activity is normal.  Hearing is intact.  The palate is symmetric.  The tongue is midline.  Motor Exam:  She has a low gait, a little bit tentative, but can walk independently.  There is no drift.  She has normal bulk, tone, and strength.  Reflexes are trace diffusely.  I think that she has downgoing toes bilaterally.  Equivocal  on the left.  Coordination:  She is able to do finger-to-nose and heel-to-shin.  Sensory:  Exam is intact.  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  She does have small vessel disease.  The urinalysis was negative.  The lipase was 13.  Sodium 133, potassium 2.7, chloride 91, CO2 35, BUN 16, creatinine 0.9, glucose 135, alkaline phosphatase 63, SGOT 23, SGPT 21, protein 7.1, calcium 9.1, albumin 3.1.  IMPRESSION:  A large left frontal parasagittal hematoma with nausea, vomiting, headache, malaise, mild abulia, and leg spasms of uncertain etiology, probably hypertensive, but her blood pressure is okay now.  PLAN:  Admit to the ACU and monitor blood pressure.  PT evaluation.  She may need rehabilitation depending on how she does with therapy.  She will need serial scans over the next several months to follow this up. Dictated by:   Gustavus Messing Orlin Hilding, M.D. Attending Physician:  Durel Salts DD:  01/20/02 TD:  01/21/02 Job: [id number]

## 2011-04-24 NOTE — Discharge Summary (Signed)
NAMEMarland Kitchen  ARMANDA, FORAND NO.:  [id number]   MEDICAL RECORD NO.:  [id number]                   PATIENT TYPE:  INP   LOCATION:  3733                                 FACILITY:  [facility]   PHYSICIAN:  Oretha Ellis, M.D.                DATE OF BIRTH:  [birth year]   DATE OF ADMISSION:  10/27/2002  DATE OF DISCHARGE:  10/28/2002                                 DISCHARGE SUMMARY   DISCHARGE DIAGNOSES:  1. Chest pain, likely gastrointestinal in origin.  2. History of headache.   DISCHARGE MEDICATIONS:  1. Protonix 40 mg p.o. q.d.  2. Hydrochlorothiazide at 25 mg p.o. q.d.  3. Metoprolol 25 mg p.o. b.i.d.  4. Zocor 20 mg p.o. q. h.s.  5. Kay Ciel 20 mEq p.o. b.i.d.  6. Baby aspirin 31 mg p.o. q.d.   DISPOSITION/FOLLOWUP:  Patient is to follow up with Dr. Marisue Brooklyn in one  to two weeks, she will be called at [phone] by [facility].  She is told to call the clinic if she does not get that appointment  within the next few days.   HOSPITAL COURSE:  1. Ms. Mcenroe is a 70 year old female with a past medical history     significant for a stroke in January of 2003 and small left frontal     intracranial hemorrhage, hypertension, hypercholesterolemia, question of     an MI in January 1998 who presented to the [facility]     for eye and ear pain and left-sided headache over the last two months.     The Saturday before admission, she received Prednisone in the Acute Care     Clinic but it did not help with the headache at all, and she had returned     for a follow up visit and reported that she had felt an 8/10 sharp chest     pain while driving home.  The pain eased with rest.  The pain started     again the morning of admission, it is a 4/10, she denies nausea,     vomiting, diaphoresis.  The patient does feel the pain may be GI in     origin, it is somewhat similar to her anginal equivalent, but she had not     felt that  in several years and the pain was once relieved with Maalox.     She currently takes Pepcid for reflux.  Given her cardiac history, the     patient was admitted to rule out MI.  Her EKGs were normal times two and     her cardiac enzymes were negative with a peak CK of 28, peak MB of 0.6,     and peak troponin I of 0.02.  She had a brief episode of pain while     hospitalized that quickly resolved, and was not exertional, no sweating,  no radiation.  Due to the fact that it was related to a GI cause, the     patient was discharged, told to try Maalox next time she has the pain to     see if that helps, and will be followed up in one to two weeks.  2.     UTI.  Of note, the patient did not complain of any urinary tract infection     signs or symptoms during the hospitalization, but urine culture obtained     shows greater than 100,000 colonies of E. coli and this should be treated     if she is symptomatic in the outpatient setting.                                               Oretha Ellis, M.D.    BT/MEDQ  D:  10/30/2002  T:  10/31/2002  Job:  [id number]   cc:   Marisue Brooklyn, M.D.  [facility] Resident - Internal Med.  [place], [place] [address]  Fax: [phone]   [facility]

## 2011-04-24 NOTE — Discharge Summary (Signed)
[facility]  Patient:    Diane Frey, Diane Frey Visit Number: [id number] MRN: [id number]          Service Type: EMS Location: ED Attending Physician:  Donnetta Hutching Dictated by:   Gustavus Messing Orlin Hilding, M.D. Admit Date:  01/20/2002 Discharge Date: 01/20/2002                             Discharge Summary  ADMISSION DIAGNOSIS:  Left frontal intracranial hemorrhage.  DISCHARGE DIAGNOSIS:  Left frontal intracranial hemorrhage.  CONDITION ON DISCHARGE:  Stable.  The patient is ambulatory with some minor assistance.  DISCHARGE MEDICATIONS:  No new medications were added.  The patient is to resume all of her home medications except for the aspirin and Vicodin.  These include, hydrochlorothiazide 25 mg q.d., Celebrex 200 mg q.d., nifedipine ER 60 mg q.d., diazepam 5 mg one t.i.d. or q.i.d. p.r.n., Zocor 10 mg q.d., Pepcid 20 mg q.d.  ACTIVITY:  Gradually increase as tolerated with home health therapy.  DIET:  Prudent diet of low salt, low fat.  DISCHARGE INSTRUCTIONS:  The patient is to have home health physical therapy and occupational therapy three times a week for four weeks.  FOLLOWUP:  Dr. Orlin Hilding in four weeks, [phone].  I will arrange a CT scan of the brain at that time.  SECONDARY DIAGNOSES: 1. Hypertension. 2. Hypokalemia secondary to the hydrochlorothiazide.  Last potassium was 3.4.  LABORATORY DATA:  Sodium of 137, mildly low chloride, slightly elevated carbon dioxide, slightly elevated glucose.  BUN, creatinine, and calcium were normal. Scans included a CT scan of the brain done at [facility], and a MRI done presumably at [facility], which revealed a left frontal para ______ hematoma about 3.5 cm with some mild edema, as well as some evidence of small vessel disease.  The patient was transferred to [facility], so the reports are not on the chart for that.  Other studies included an echocardiogram to rule out embolus.  This showed  a vigorous left ventricular systolic function with ejection fraction of 65 to 75% without regional wall abnormalities, mildly dilated left atrium, trivial pericardial effusion, no evidence for source of embolus.  Rhythm strips showed a regular rate and rhythm.  CBC on 01/20/02, was normal with a white blood cell count of 10.2, hemoglobin 14.6, hematocrit 42.9, platelets 278.  HISTORY OF PRESENT ILLNESS:  Please refer to history and physical.  Briefly, this is a 70 year old woman with onset about a month prior to admission of nausea and vomiting, malaise, headache, gradually progressing until a few days prior to admission she had increased lethargy with some leg spasms, did not feel like getting up, hard to walk.  She had been seen at her primary care at [facility] several times, thought to have a viral infection. Was also seen at [facility] ER three times because of continued symptoms and inability to keep appointment.  She was finally brought by EMS to [facility] where a CT showed the hematoma.  It is presumably secondary to hypertension, although her blood pressure has been stable since admission. Another thought is that it may be masking an underlying mass lesion, although this was felt less likely after the MRI was done.  Finally, amyloid, although the patients age is fairly young for that, and then another thought is that an embolic stroke, hence, the echocardiogram which was normal.  The patient was seen by therapy.  Really had  a nonfocal examination except for some ______ and just a lag in response time.  Her walking was tentative, but otherwise unremarkable.  She was monitored in the ICU initially to make sure that her blood pressure was stable, which it was.  Sent to the floor, and really had no particular problems.  She may need to be on potassium supplement to maintain the potassium at discharge, and should follow up with regard to that at [facility] as usual.  She is  discharged in improved condition for further outpatient management. Dictated by:   Gustavus Messing Orlin Hilding, M.D. Attending Physician:  Donnetta Hutching DD:  01/25/02 TD:  01/25/02 Job: [id number]

## 2011-04-24 NOTE — Procedures (Signed)
NAME:  CARTHA, ROTERT NO.:  [id number]   MEDICAL RECORD NO.:  [id number]          PATIENT TYPE:  OUT   LOCATION:  SLEEP CENTER                 FACILITY:  [facility]   PHYSICIAN:  Barbaraann Share, MD,FCCPDATE OF BIRTH:  Jan 13, 1941   DATE OF STUDY:  10/26/2006                            NOCTURNAL POLYSOMNOGRAM   REFERRING PHYSICIAN:  Dr. Valetta Mole. Swords   INDICATION FOR STUDY:  Hypersomnia with sleep apnea.   EPWORTH SLEEPINESS SCORE:  17.   SLEEP ARCHITECTURE:  The patient had a total sleep time of 311 minutes  with adequate REM but decreased slow-wave sleep. Sleep onset latency was  mildly prolonged at 34 minutes, and REM onset was normal. Sleep  efficiency was significantly decreased at 64%. However, was influenced  by the patient's awakening to be fitted for CPAP.   RESPIRATORY DATA:  The patient underwent split-night study where she was  found to have 52 obstructive events in the first 143 minutes of sleep.  This gave her a respiratory disturbance index of 22 events per hour over  the first half of the night. The events were associated with moderate  snoring. By protocol, the patient was then fitted with a medium comfort  full face mask and ultimately titrated as high as 13 cm of water  pressure. However, the patient did just as well at 11 cm, and there  appeared to be pressure-induced central apneas after that value.   OXYGEN DATA:  There was O2 desaturation as low as 76% with the patient's  obstructive events associated with REM.   CARDIAC DATA:  No clinically significant cardiac arrhythmias.   MOVEMENT-PARASOMNIA:  None.   IMPRESSIONS-RECOMMENDATIONS:  Split-night study reveals moderate  obstructive sleep apnea during the first half of the night with a  respiratory disturbance index of 22 events per hour and O2 desaturation  as low as 76%. The patient was then fitted with a medium comfort full  CPAP mask and seemed to have the best control of her sleep  apnea with 11  cm of water pressure.     Barbaraann Share, MD,FCCP  Diplomate, American Board of Sleep  Medicine    [facility]/MEDQ  D:  11/03/2006 12:15:59  T:  11/03/2006 17:05:53  Job:  [id number]

## 2011-04-27 ENCOUNTER — Other Ambulatory Visit: Payer: Self-pay | Admitting: *Deleted

## 2011-05-01 ENCOUNTER — Other Ambulatory Visit: Payer: Self-pay | Admitting: *Deleted

## 2011-05-01 MED ORDER — DONEPEZIL HCL 10 MG PO TABS: 10.0000 mg | ORAL_TABLET | Freq: Every day | ORAL | Status: DC

## 2011-05-27 ENCOUNTER — Telehealth: Payer: Self-pay | Admitting: *Deleted

## 2011-05-27 NOTE — Telephone Encounter (Signed)
Checking status of certificate of medical necessity previously sent in May for Dr. Pernell Dupre to sign.  Please call back with status

## 2011-06-04 NOTE — Telephone Encounter (Signed)
LMTCB

## 2011-06-24 ENCOUNTER — Telehealth: Payer: Self-pay | Admitting: Internal Medicine

## 2011-08-28 LAB — DIFFERENTIAL
Basophils Relative: 0
Eosinophils Absolute: 0.1
Eosinophils Relative: 1
Lymphs Abs: 1.4
Neutrophils Relative %: 75

## 2011-08-28 LAB — URINALYSIS, ROUTINE W REFLEX MICROSCOPIC
Leukocytes, UA: NEGATIVE
Nitrite: NEGATIVE
Protein, ur: NEGATIVE
Specific Gravity, Urine: 1.016
Urobilinogen, UA: 1

## 2011-08-28 LAB — CBC
HCT: 42.4
MCHC: 33.5
MCV: 86.8
Platelets: 242

## 2011-08-28 LAB — I-STAT 8, (EC8 V) (CONVERTED LAB)
Acid-Base Excess: 4 — ABNORMAL HIGH
BUN: 8
Bicarbonate: 30 — ABNORMAL HIGH
Chloride: 105
Glucose, Bld: 103 — ABNORMAL HIGH
HCT: 45
Hemoglobin: 15.3 — ABNORMAL HIGH
Operator id: 196461
Potassium: 4
Sodium: 140
TCO2: 31
pCO2, Ven: 47.7
pH, Ven: 7.406 — ABNORMAL HIGH

## 2011-08-28 LAB — POCT I-STAT CREATININE
Creatinine, Ser: 1
Operator id: 196461

## 2011-08-28 LAB — POCT I-STAT 3, ART BLOOD GAS (G3+)
Acid-Base Excess: 4 — ABNORMAL HIGH
Bicarbonate: 28.7 — ABNORMAL HIGH
O2 Saturation: 94
Operator id: 290241
Patient temperature: 98.6
TCO2: 30
pCO2 arterial: 41.6
pH, Arterial: 7.447 — ABNORMAL HIGH
pO2, Arterial: 69 — ABNORMAL LOW

## 2011-08-28 LAB — URINE MICROSCOPIC-ADD ON

## 2011-09-03 IMAGING — CT CT HEAD W/O CM
1 series · 16 of 30 positions shown, 20 images · non-contrast
Comparison: MRI brain 02/02/08, CT head 02/01/2008

CLINICAL DATA: Dementia, memory loss, occasional headaches.
History of 82

CT HEAD WITHOUT CONTRAST
TECHNIQUE: Contiguous axial images were obtained from the base of
the skull through the vertex without contrast.

[Series 2: head_seq 4.5 h37s st · axial · 0.43mm/px · z∈[-110,+38]mm · 16 of 36 slices shown, 20 images]
[im 2/36  brain]
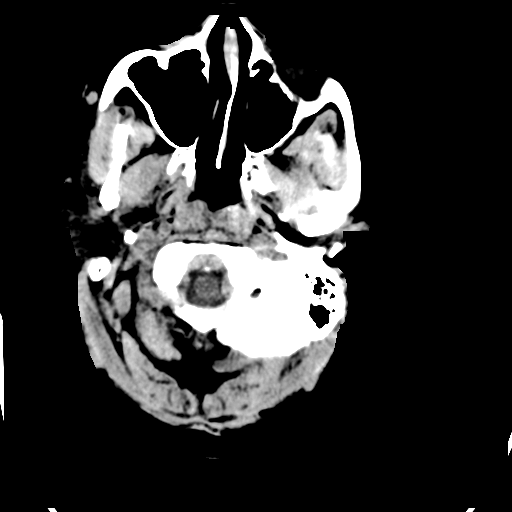
[im 2/36  bone]
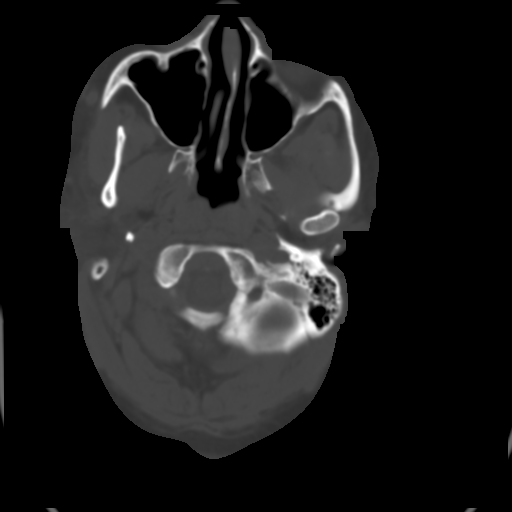
[im 4/36  brain]
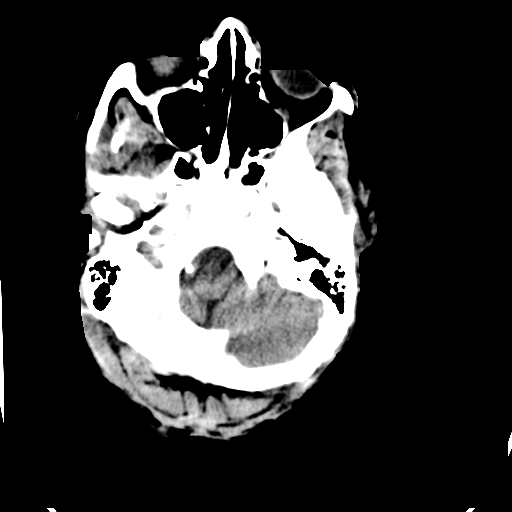
[im 7/36  brain]
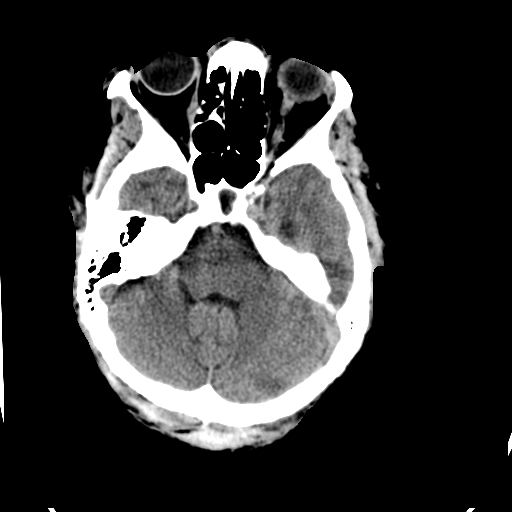
[im 9/36  brain]
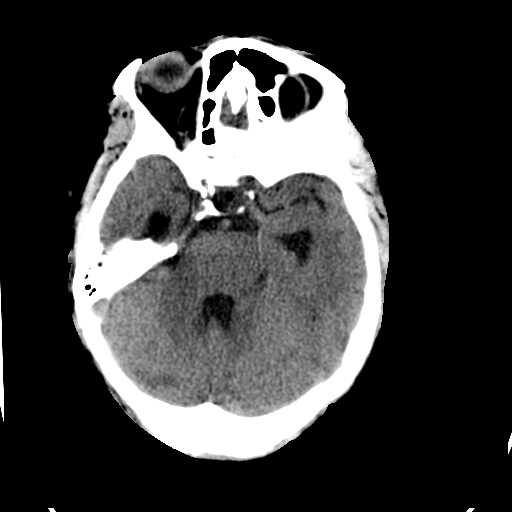
[im 10/36  brain]
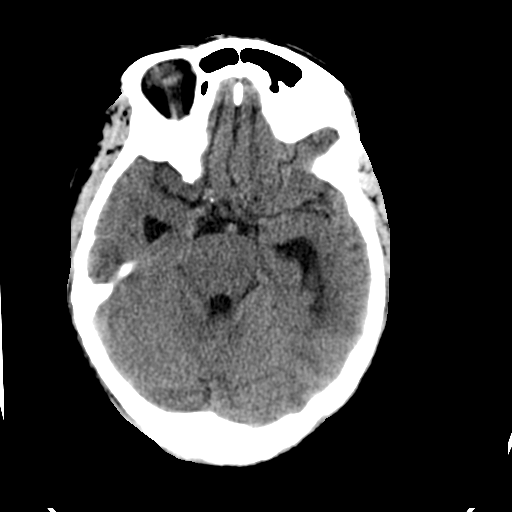
[im 10/36  bone]
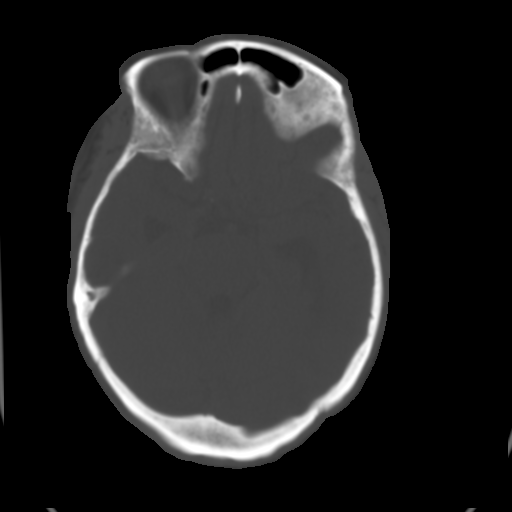
[im 13/36  brain]
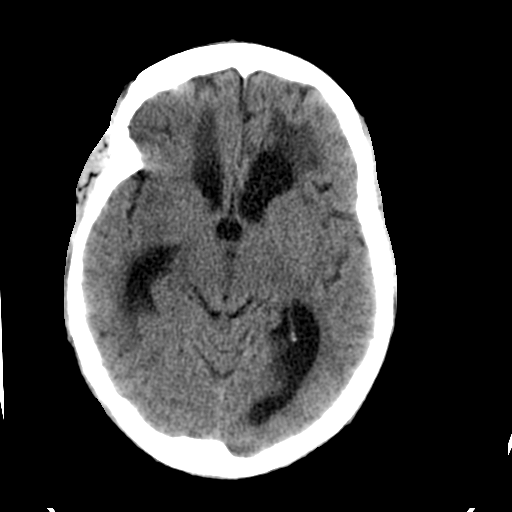
[im 15/36  brain]
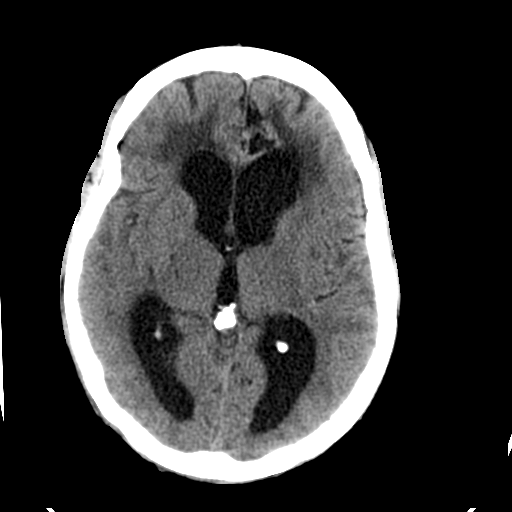
[im 17/36  brain]
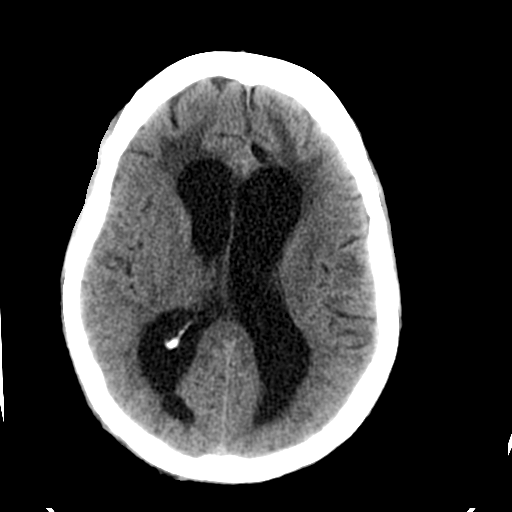
[im 19/36  brain]
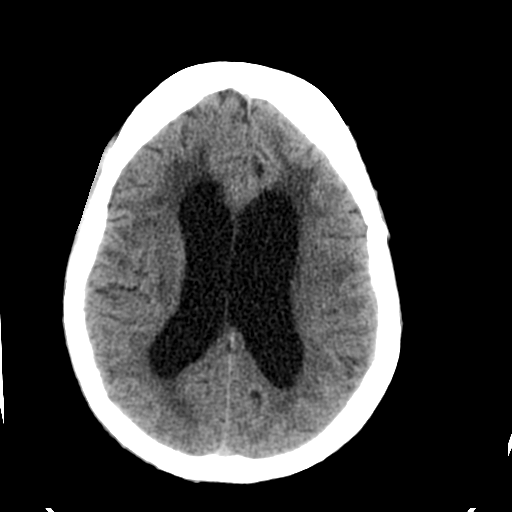
[im 19/36  bone]
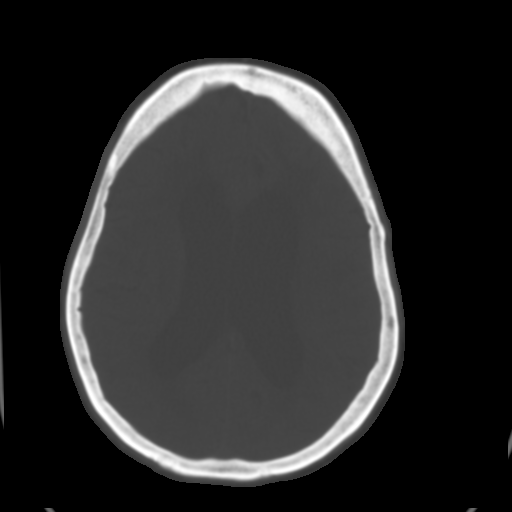
[im 21/36  brain]
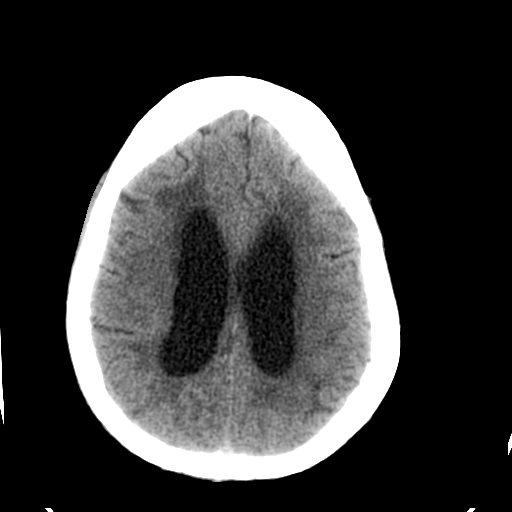
[im 23/36  brain]
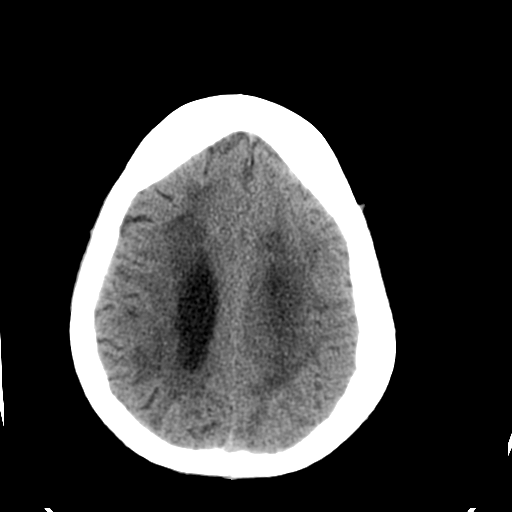
[im 26/36  brain]
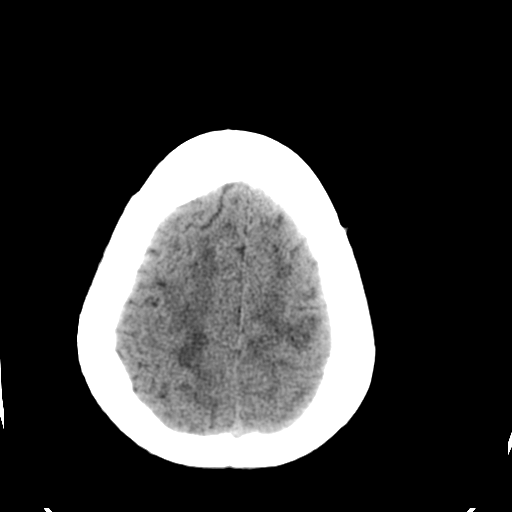
[im 27/36  brain]
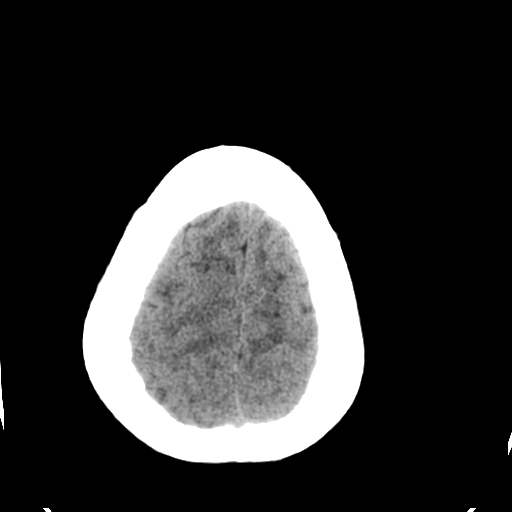
[im 27/36  bone]
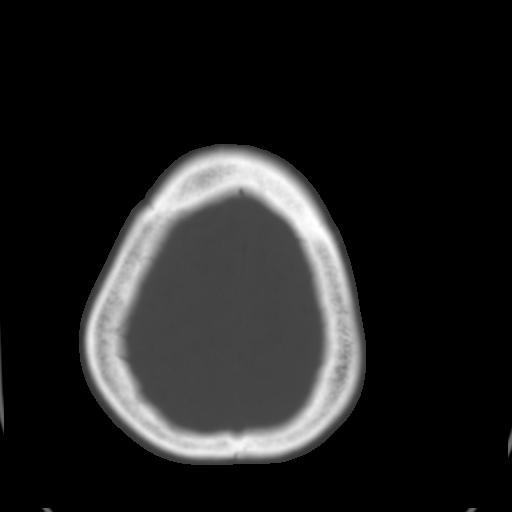
[im 29/36  brain]
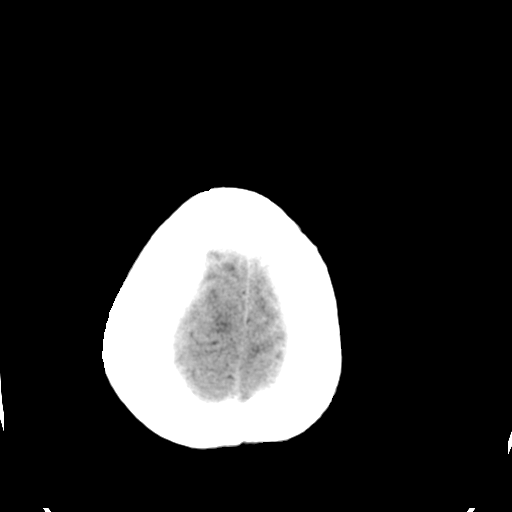
[im 32/36  brain]
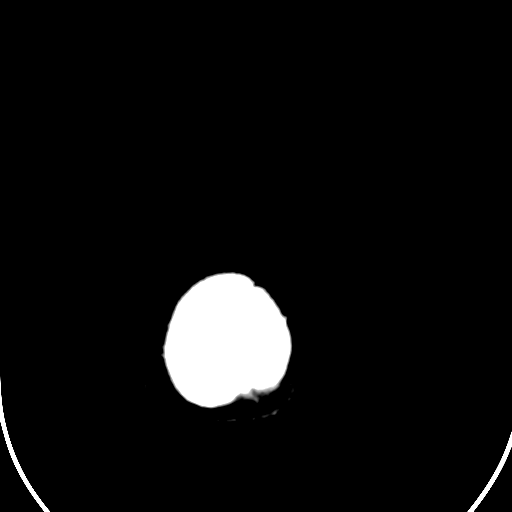
[im 34/36  brain]
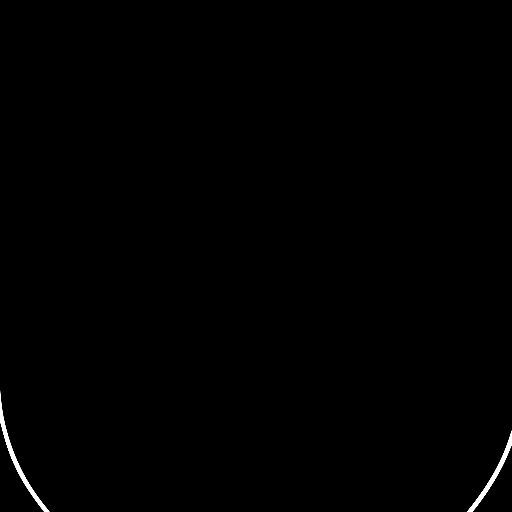

[16 of 30 positions shown; findings below may reference images not displayed]

FINDINGS: No acute intracranial hemorrhage.  No focal mass lesion.
No CT evidence of acute infarction.  There is ventricular
dilatation which is similar to comparison MRI and CT.  There is
mild cortical atrophy.  There is extensive periventricular and
subcortical white matter disease which is also unchanged.  No
midline shift or mass effect. Remote infarction in the paramedian
left frontal lobe.

Paranasal sinuses mastoid air cells are clear.
IMPRESSION: 1.  No acute intracranial findings.
2.  Ventricular dilatation and atrophy are similar to prior.
3.  Extensive white matter hypodensities are most consistent with
small vessel ischemia and are unchanged from prior.

## 2011-09-16 LAB — DIFFERENTIAL
Eosinophils Absolute: 0.2
Eosinophils Relative: 3
Lymphocytes Relative: 30
Lymphs Abs: 1.8
Monocytes Absolute: 0.3
Monocytes Relative: 5

## 2011-09-16 LAB — CBC
HCT: 41.6
Hemoglobin: 14
MCV: 85
RBC: 4.9
WBC: 6.2

## 2011-09-16 LAB — BASIC METABOLIC PANEL
Chloride: 105
GFR calc non Af Amer: >60
Potassium: 3.7
Sodium: 141

## 2011-09-16 LAB — PROTIME-INR: Prothrombin Time: 12.6

## 2011-09-16 LAB — NO BLOOD PRODUCTS

## 2012-06-02 ENCOUNTER — Other Ambulatory Visit: Payer: Self-pay | Admitting: Family Medicine

## 2012-06-02 DIAGNOSIS — I671 Cerebral aneurysm, nonruptured: Secondary | ICD-10-CM

## 2012-06-03 ENCOUNTER — Ambulatory Visit
Admission: RE | Admit: 2012-06-03 | Discharge: 2012-06-03 | Disposition: A | Discharge status: Home / Self Care | Payer: No Typology Code available for payment source | Source: Ambulatory Visit | Attending: Family Medicine | Admitting: Family Medicine

## 2012-06-03 DIAGNOSIS — I671 Cerebral aneurysm, nonruptured: Secondary | ICD-10-CM

## 2012-06-03 MED ORDER — GADOBENATE DIMEGLUMINE 529 MG/ML IV SOLN
20.0000 mL | Freq: Once | INTRAVENOUS | Status: AC | PRN
  Administered 2012-06-03: 20 mL via INTRAVENOUS

## 2012-10-21 ENCOUNTER — Other Ambulatory Visit: Payer: Self-pay | Admitting: Family Medicine

## 2012-10-21 DIAGNOSIS — N939 Abnormal uterine and vaginal bleeding, unspecified: Secondary | ICD-10-CM

## 2012-10-26 ENCOUNTER — Inpatient Hospital Stay
Admission: RE | Admit: 2012-10-26 | Discharge: 2012-10-26 | Payer: Self-pay | Source: Ambulatory Visit | Attending: Family Medicine | Admitting: Family Medicine

## 2012-10-26 ENCOUNTER — Other Ambulatory Visit: Payer: Self-pay

## 2012-10-28 ENCOUNTER — Ambulatory Visit
Admission: RE | Admit: 2012-10-28 | Discharge: 2012-10-28 | Disposition: A | Discharge status: Home / Self Care | Payer: No Typology Code available for payment source | Source: Ambulatory Visit | Attending: Family Medicine | Admitting: Family Medicine

## 2012-10-28 DIAGNOSIS — N939 Abnormal uterine and vaginal bleeding, unspecified: Secondary | ICD-10-CM

## 2012-10-28 MED ORDER — IOHEXOL 300 MG/ML  SOLN
100.0000 mL | Freq: Once | INTRAMUSCULAR | Status: AC | PRN
  Administered 2012-10-28: 100 mL via INTRAVENOUS

## 2012-12-12 ENCOUNTER — Other Ambulatory Visit: Payer: Self-pay | Admitting: Obstetrics and Gynecology

## 2012-12-12 ENCOUNTER — Other Ambulatory Visit (HOSPITAL_COMMUNITY)
Admission: RE | Admit: 2012-12-12 | Discharge: 2012-12-12 | Disposition: A | Discharge status: Home / Self Care | Payer: Medicaid Other | Source: Ambulatory Visit | Attending: Obstetrics and Gynecology | Admitting: Obstetrics and Gynecology

## 2012-12-12 DIAGNOSIS — Z124 Encounter for screening for malignant neoplasm of cervix: Secondary | ICD-10-CM | POA: Insufficient documentation

## 2012-12-13 ENCOUNTER — Other Ambulatory Visit: Payer: Self-pay | Admitting: Obstetrics and Gynecology

## 2013-03-21 ENCOUNTER — Other Ambulatory Visit: Payer: Self-pay | Admitting: *Deleted

## 2013-03-21 ENCOUNTER — Ambulatory Visit
Admission: RE | Admit: 2013-03-21 | Discharge: 2013-03-21 | Disposition: A | Discharge status: Home / Self Care | Payer: No Typology Code available for payment source | Source: Ambulatory Visit | Attending: *Deleted | Admitting: *Deleted

## 2013-03-21 DIAGNOSIS — M25571 Pain in right ankle and joints of right foot: Secondary | ICD-10-CM

## 2013-06-02 ENCOUNTER — Other Ambulatory Visit: Payer: Self-pay | Admitting: Obstetrics and Gynecology

## 2013-07-05 ENCOUNTER — Encounter: Payer: Self-pay | Admitting: Obstetrics & Gynecology

## 2013-07-05 ENCOUNTER — Ambulatory Visit (INDEPENDENT_AMBULATORY_CARE_PROVIDER_SITE_OTHER): Payer: Medicaid Other | Admitting: Obstetrics & Gynecology

## 2013-07-05 VITALS — BP 122/73 | HR 63 | Temp 98.6°F | Resp 20 | Ht 62.0 in | Wt 264.0 lb

## 2013-07-05 DIAGNOSIS — N950 Postmenopausal bleeding: Secondary | ICD-10-CM | POA: Insufficient documentation

## 2013-07-05 NOTE — Patient Instructions (Addendum)
Postmenopausal Bleeding Menopause is commonly referred to as the "change in life." It is a time when the fertile years, the time of ovulating and having menstrual periods, has come to an end. It is also determined by not having menstrual periods for 12 months.  Postmenopausal bleeding is any bleeding a woman has after she has entered into menopause. Any type of postmenopausal bleeding, even if it appears to be a typical menstrual period, is concerning. This should be evaluated by your caregiver.  CAUSES   Hormone therapy.  Cancer of the cervix or cancer of the lining of the uterus (endometrial cancer).  Thinning of the uterine lining (uterine atrophy).  Thyroid diseases.  Certain medicines.  Infection of the uterus or cervix.  Inflammation or irritation of the uterine lining (endometritis).  Estrogen-secreting tumors.  Growths (polyps) on the cervix, uterine lining, or uterus.  Uterine tumors (fibroids).  Being very overweight (obese). DIAGNOSIS  Your caregiver will take a medical history and ask questions. A physical exam will also be performed. Further tests may include:   A transvaginal ultrasound. An ultrasound wand or probe is inserted into your vagina to view the pelvic organs.  A biopsy of the lining of the uterus (endometrium). A sample of the endometrium is removed and examined.  A hysteroscopy. Your caregiver may use an instrument with a light and a camera attached to it (hysteroscope). The hysteroscope is used to look inside the uterus for problems.  A dilation and curettage (D&C). Tissue is removed from the uterine lining to be examined for problems. TREATMENT  Treatment depends on the cause of the bleeding. Some treatments include:   Surgery.  Medicines.  Hormones.  A hysteroscopy or D&C to remove polyps or fibroids.  Changing or stopping a current medicine you are taking. Talk to your caregiver about your specific treatment. HOME CARE INSTRUCTIONS    Maintain a healthy weight.  Keep regular pelvic exams and Pap tests. SEEK MEDICAL CARE IF:   You have bleeding, even if it is light in comparison to your previous periods.  Your bleeding lasts more than 1 week.  You have abdominal pain.  You develop bleeding with sexual intercourse. SEEK IMMEDIATE MEDICAL CARE IF:   You have a fever, chills, headache, dizziness, muscle aches, and bleeding.  You have severe pain with bleeding.  You are passing blood clots.  You have bleeding and need more than 1 pad an hour.  You feel faint. MAKE SURE YOU:  Understand these instructions.  Will watch your condition.  Will get help right away if you are not doing well or get worse. Document Released: 03/03/2006 Document Revised: 02/15/2012 Document Reviewed: 07/30/2011 ExitCare Patient Information 2014 [employer].  

## 2013-07-05 NOTE — Progress Notes (Signed)
  Subjective:    Patient ID: Diane Frey, female    DOB: 17-Dec-1940, 72 y.o.   MRN: [id number]  HPI Pt here for post-menopausal bleeding.   She states that she had spotting X 1 day last week. She cannot remember the last time she  Had spotting prior to that but explains that it has been a very long time since she had vaginal bleeding. She Denies any pelvic pain or sexual activity.   She had a pelvic US in november on 2013 and cannot recall the reason for her [place] at that time.    Review of Systems She denies fever, chills, sweats, pelvic pain, dysuria, hematuria, and constipation.  She has occasional diarrhea but none recently  She endorses urge incontinence    Objective:   Physical Exam  Gen: NAD, alert, cooperative with exam HEENT: NCAT CV: RRR, good S1/S2, no murmur Resp: CTABL, no wheezes, non-labored Abd: SNTND, BS present, no guarding or organomegaly  10/28/2012 TRANSABDOMINAL AND TRANSVAGINAL ULTRASOUND OF PELVIS Clinical Data: Post menopausal bleeding  Comparison: CT of the pelvis of 10/28/2012 Findings: Uterus: The wrists air is slightly prominent measuring 12.2 cm sagittally with a depth of 5.4 cm and width of 7.6 cm. Several fibroids are present. The largest fibroid is in the right upper lateral views of 3.6 x 2.4 x 3.4 cm. A small fibroid is noted in the left fundus of 1.6 x 1.2 x 1.5 cm. Endometrium:The endometrium is somewhat thickened post menopausally measuring 8.1 mm. No definite polyp or mass is seen, but in this post menopausal female with bleeding, further assessment as with endometrial biopsy may be warranted. Right Ovary :Right ovary measures 3.2 x 1.9 x 1.9 cm. Left Ovary :The left ovary measures 2.3 x 2.1 x 1.7 cm. Other Findings: There is only a trace amount of free fluid is present. IMPRESSION: 1. Two uterine fibroids the largest of 3.6 cm in diameter. The myometrium is slightly inhomogeneous. 2. Somewhat thickened endometrium post menopausally of 8 mm.  Consider further assessed with endometrial biopsy. 3. Both ovaries are unremarkable. Original Report Authenticated By: Dwyane Dee, M.D.  Assessment & Plan:   Postmenopausal bleeding - 8.1 mm endometrial stripe in 10/2012 - She needs EMB but given that she has severe dementia and her imaging is 8 month sold we will repeat her [place] and have her back in 2-4 weeks with her daughter and perform an EMB at that time. Bleeding precautions reviewed.  Murtis Sink, MD [facility] Resident, PGY-2 07/05/2013, 4:38 PM   Attestation of Attending Supervision of Resident: Evaluation and management procedures were performed by the [facility] Resident under my supervision.  I have seen and examined the patient, reviewed the resident's note and chart, and I agree with the management and plan.  Jaynie Collins, MD, FACOG Attending Obstetrician & Gynecologist Faculty Practice, [facility] [place]

## 2013-07-05 NOTE — Progress Notes (Signed)
Pt states she was unaware of today's appointment and did not find out until she was at [facility] today. She also does not know the names of all of her meds. She states she will bring them to any future appointments.

## 2013-07-26 ENCOUNTER — Encounter: Payer: Self-pay | Admitting: *Deleted

## 2013-08-08 ENCOUNTER — Encounter: Payer: Self-pay | Admitting: *Deleted

## 2013-08-21 ENCOUNTER — Encounter: Payer: Self-pay | Admitting: *Deleted

## 2013-12-25 ENCOUNTER — Ambulatory Visit: Payer: Medicaid Other | Admitting: Obstetrics and Gynecology

## 2014-07-09 ENCOUNTER — Ambulatory Visit: Payer: Medicaid Other | Admitting: Physician Assistant

## 2014-07-12 ENCOUNTER — Encounter: Payer: Self-pay | Admitting: Physician Assistant

## 2014-07-12 ENCOUNTER — Ambulatory Visit (INDEPENDENT_AMBULATORY_CARE_PROVIDER_SITE_OTHER): Payer: Medicare Other | Admitting: Physician Assistant

## 2014-07-12 VITALS — BP 102/68 | HR 60 | Temp 97.9°F | Resp 16 | Ht 62.0 in | Wt 249.0 lb

## 2014-07-12 DIAGNOSIS — R82998 Other abnormal findings in urine: Secondary | ICD-10-CM

## 2014-07-12 DIAGNOSIS — R399 Unspecified symptoms and signs involving the genitourinary system: Secondary | ICD-10-CM

## 2014-07-12 DIAGNOSIS — E785 Hyperlipidemia, unspecified: Secondary | ICD-10-CM

## 2014-07-12 DIAGNOSIS — R3989 Other symptoms and signs involving the genitourinary system: Secondary | ICD-10-CM | POA: Diagnosis not present

## 2014-07-12 DIAGNOSIS — I10 Essential (primary) hypertension: Secondary | ICD-10-CM | POA: Diagnosis not present

## 2014-07-12 DIAGNOSIS — R8290 Unspecified abnormal findings in urine: Secondary | ICD-10-CM

## 2014-07-12 DIAGNOSIS — Z1211 Encounter for screening for malignant neoplasm of colon: Secondary | ICD-10-CM

## 2014-07-12 DIAGNOSIS — F068 Other specified mental disorders due to known physiological condition: Secondary | ICD-10-CM

## 2014-07-12 DIAGNOSIS — I2510 Atherosclerotic heart disease of native coronary artery without angina pectoris: Secondary | ICD-10-CM

## 2014-07-12 LAB — POCT URINALYSIS DIPSTICK
Bilirubin, UA: NEGATIVE
GLUCOSE UA: NEGATIVE
Ketones, UA: NEGATIVE
NITRITE UA: NEGATIVE
Protein, UA: NEGATIVE
Spec Grav, UA: 1.02
Urobilinogen, UA: 0.2
pH, UA: 5

## 2014-07-12 MED ORDER — FUROSEMIDE 40 MG PO TABS: 40.0000 mg | ORAL_TABLET | Freq: Every day | ORAL | Status: DC

## 2014-07-12 MED ORDER — ASPIRIN 81 MG PO TABS: 81.0000 mg | ORAL_TABLET | Freq: Every day | ORAL | Status: DC

## 2014-07-12 MED ORDER — LISINOPRIL 40 MG PO TABS: 40.0000 mg | ORAL_TABLET | Freq: Every day | ORAL | Status: DC

## 2014-07-12 MED ORDER — OMEPRAZOLE 20 MG PO CPDR: 20.0000 mg | DELAYED_RELEASE_CAPSULE | Freq: Every day | ORAL | Status: DC

## 2014-07-12 MED ORDER — MEMANTINE HCL ER 28 MG PO CP24: 1.0000 | ORAL_CAPSULE | Freq: Every day | ORAL | Status: DC

## 2014-07-12 MED ORDER — POTASSIUM CHLORIDE CRYS ER 20 MEQ PO TBCR: 20.0000 meq | EXTENDED_RELEASE_TABLET | Freq: Every day | ORAL | Status: DC

## 2014-07-12 MED ORDER — AMLODIPINE BESYLATE 5 MG PO TABS: 5.0000 mg | ORAL_TABLET | Freq: Every day | ORAL | Status: DC

## 2014-07-12 MED ORDER — ATORVASTATIN CALCIUM 40 MG PO TABS: 40.0000 mg | ORAL_TABLET | Freq: Every day | ORAL | Status: DC

## 2014-07-12 MED ORDER — CIPROFLOXACIN HCL 250 MG PO TABS: 250.0000 mg | ORAL_TABLET | Freq: Two times a day (BID) | ORAL | Status: DC

## 2014-07-12 MED ORDER — DONEPEZIL HCL 10 MG PO TABS: 10.0000 mg | ORAL_TABLET | Freq: Every day | ORAL | Status: DC

## 2014-07-12 MED ORDER — FERROUS SULFATE 325 (65 FE) MG PO TABS: 325.0000 mg | ORAL_TABLET | Freq: Two times a day (BID) | ORAL | Status: DC

## 2014-07-12 NOTE — Progress Notes (Signed)
Pre visit review using our clinic review tool, if applicable. No additional management support is needed unless otherwise documented below in the visit note/SLS  

## 2014-07-12 NOTE — Progress Notes (Signed)
Patient presents to clinic today with her daughter to establish care.  Acute Concerns: Patient with symptoms of dysuria, urinary urgency and frequency.  Symptoms have been present for a week.  Denies fever, chills, nausea, vomiting or LBP.   Chronic Issues: Hypertension -- Well controlled with combination of Amlodipine 5 mg QD, Lisinopril 40 mg QD, Carvedilol 25 mg BID and ASA 81 mg.  Patient also on statin therapy due to elevated cholesterol and history of CAD and prior MI.  Patient denies chest pain, palpitations, lightheadedness or dizziness.  Denies chronic cough with ACEI.   Hyperlipidemia -- Currently on Lipitor 40 mg daily.  Denies myalgias/arthralgias.  Is also currently taking 81 mg ASA daily.   Dementia -- Currently on Aricept and Namenda daily.   Health Maintenance: Dental -- edentulous; wears dentures Vision -- up-to-date Immunizations -- Tetanus in 2012;  Colonoscopy --  Due for repeat Colonoscopy.  Will refer to GI for screening. Mammogram -- Unsure of last screen.  Will need records from PCP. Bone Density -- Unsure of last testing.  Citracal daily.    Past Medical History  Diagnosis Date  . CVA 02/10/2008  . ENDOCARDITIS 09/29/2010  . HYPERLIPIDEMIA 02/14/2007  . HYPERTENSION 02/14/2007  . MYOCARDIAL INFARCTION, HX OF 02/14/2007  . OBESITY 01/16/2009  . OBSTRUCTIVE SLEEP APNEA 05/27/2007  . 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 CORONARY ARTERY DISEASE 02/14/2007  . DEMENTIA 01/20/2010  . CAD (coronary artery disease)   . Myocardial infarct   . Edema   . Alzheimer disease   . GERD (gastroesophageal reflux disease)   . Glaucoma   . Gout   . Unspecified urinary incontinence   . Postmenopausal bleeding   . Morbid obesity   . Lack of coordination   . Aneurysm     Head    Past Surgical History  Procedure Laterality Date  . Tubal ligation      Current Outpatient Prescriptions on File Prior to Visit  Medication Sig Dispense Refill  . Calcium Carbonate-Vit  D-Min (CALTRATE 600+D PLUS) 600-400 MG-UNIT per tablet Take 1 tablet by mouth daily.        . Multiple Vitamin (MULTIVITAMIN) capsule Take 1 capsule by mouth daily.         No current facility-administered medications on file prior to visit.    No Known Allergies  Family History  Problem Relation Age of Onset  . Heart failure Mother     Deceased  . Kidney disease Brother     Deceased  . Heart disease Father     Deceased  . Cancer Mother   . Stroke Sister   . Hypertension Brother   . Hypertension Sister   . Heart disease Sister     Deceased  . Hypertension Sister     x2  . Hypertension Son     x2  . Glaucoma Son     History   Social History  . Marital Status: Widowed    Spouse Name: N/A    Number of Children: N/A  . Years of Education: N/A   Occupational History  . Not on file.   Social History Main Topics  . Smoking status: Never Smoker   . Smokeless tobacco: Never Used  . Alcohol Use: No  . Drug Use: No  . Sexual Activity: No   Other Topics Concern  . Not on file   Social History Narrative  . No narrative on file   ROS See HPI.  All other ROS are negative.  BP 102/68  Pulse 60  Temp(Src) 97.9 F (36.6 C) (Oral)  Resp 16  Ht 5\' 2"  (1.575 m)  Wt 249 lb (112.946 kg)  BMI 45.53 kg/m2  SpO2 96%  Physical Exam  Vitals reviewed. Constitutional: She is well-developed, well-nourished, and in no distress.  HENT:  Head: Normocephalic and atraumatic.  Right Ear: External ear normal.  Left Ear: External ear normal.  Nose: Nose normal.  Mouth/Throat: Oropharynx is clear and moist. No oropharyngeal exudate.  TM within normal limits bilaterally.  Eyes: Conjunctivae are normal. Pupils are equal, round, and reactive to light.  Neck: Neck supple. No thyromegaly present.  Cardiovascular: Normal rate, regular rhythm, normal heart sounds and intact distal pulses.   Pulmonary/Chest: Effort normal and breath sounds normal. No respiratory distress. She has no  wheezes. She has no rales. She exhibits no tenderness.  Lymphadenopathy:    She has no cervical adenopathy.  Neurological: She is alert. No cranial nerve deficit.  Oriented x 2.  Skin: Skin is warm and dry. No rash noted.  Psychiatric: Affect normal.   Assessment/Plan: CORONARY ARTERY DISEASE Continue statin therapy and daily ASA.  Due for physical with fasting labs in November. Patient to schedule appointment.  Essential hypertension, benign Well controlled.  Asymptomatic.  Continue current regimen.  DEMENTIA Continue current medication regimen.  UTI symptoms Urine sent for culture.  Giving symptoms, age and comorbid conditions, I feel benefit of treatment outweighs risk.  Will empirically treat with Cipro 250 mg BID x 3 days.  Will alter therapy based on culture results.  Patient instructed to stay well hydrated and to take a daily probiotic.  HYPERLIPIDEMIA Continue current regimen.  Will repeat lipid panel at physical in November.

## 2014-07-12 NOTE — Patient Instructions (Signed)
Please obtain labs.  I will call you with your results. Continue medications as directed.  Follow-up with me in November for a Complete Physical.  For UTI -- take antibiotic as directed.  Stay well hydrated.

## 2014-07-13 ENCOUNTER — Telehealth: Payer: Self-pay | Admitting: Physician Assistant

## 2014-07-13 LAB — COMPLETE METABOLIC PANEL WITH GFR
ALT: 14 U/L (ref 0–35)
AST: 17 U/L (ref 0–37)
Albumin: 4 g/dL (ref 3.5–5.2)
Alkaline Phosphatase: 79 U/L (ref 39–117)
BUN: 13 mg/dL (ref 6–23)
CO2: 31 mEq/L (ref 19–32)
Calcium: 9.4 mg/dL (ref 8.4–10.5)
Chloride: 105 mEq/L (ref 96–112)
Creat: 0.91 mg/dL (ref 0.50–1.10)
GFR, Est African American: 73 mL/min
GFR, Est Non African American: 64 mL/min
Glucose, Bld: 114 mg/dL — ABNORMAL HIGH (ref 70–99)
Potassium: 3.9 mEq/L (ref 3.5–5.3)
SODIUM: 143 meq/L (ref 135–145)
TOTAL PROTEIN: 6.7 g/dL (ref 6.0–8.3)
Total Bilirubin: 0.4 mg/dL (ref 0.2–1.2)

## 2014-07-13 NOTE — Telephone Encounter (Signed)
Relevant patient education mailed to patient.  

## 2014-07-15 LAB — CULTURE, URINE COMPREHENSIVE

## 2014-07-19 ENCOUNTER — Encounter: Payer: Self-pay | Admitting: *Deleted

## 2014-07-20 ENCOUNTER — Other Ambulatory Visit: Payer: Self-pay | Admitting: *Deleted

## 2014-07-20 MED ORDER — CARVEDILOL 25 MG PO TABS: 25.0000 mg | ORAL_TABLET | Freq: Two times a day (BID) | ORAL | Status: DC

## 2014-07-20 NOTE — Progress Notes (Signed)
Per patient request, Rx sent to pharmacy/SLS

## 2014-08-06 DIAGNOSIS — H409 Unspecified glaucoma: Secondary | ICD-10-CM | POA: Diagnosis not present

## 2014-08-06 DIAGNOSIS — I10 Essential (primary) hypertension: Secondary | ICD-10-CM | POA: Insufficient documentation

## 2014-08-06 DIAGNOSIS — H4011X0 Primary open-angle glaucoma, stage unspecified: Secondary | ICD-10-CM | POA: Diagnosis not present

## 2014-08-06 DIAGNOSIS — R399 Unspecified symptoms and signs involving the genitourinary system: Secondary | ICD-10-CM | POA: Insufficient documentation

## 2014-08-06 NOTE — Assessment & Plan Note (Signed)
Continue current regimen.  Will repeat lipid panel at physical in November.

## 2014-08-06 NOTE — Assessment & Plan Note (Signed)
Urine sent for culture.  Giving symptoms, age and comorbid conditions, I feel benefit of treatment outweighs risk.  Will empirically treat with Cipro 250 mg BID x 3 days.  Will alter therapy based on culture results.  Patient instructed to stay well hydrated and to take a daily probiotic.

## 2014-08-06 NOTE — Assessment & Plan Note (Signed)
Continue statin therapy and daily ASA.  Due for physical with fasting labs in November. Patient to schedule appointment.

## 2014-08-06 NOTE — Assessment & Plan Note (Signed)
Well controlled. Asymptomatic °Continue current regimen °

## 2014-08-06 NOTE — Assessment & Plan Note (Signed)
Continue current medication regimen

## 2014-08-09 DIAGNOSIS — H409 Unspecified glaucoma: Secondary | ICD-10-CM | POA: Diagnosis not present

## 2014-08-09 DIAGNOSIS — H4011X0 Primary open-angle glaucoma, stage unspecified: Secondary | ICD-10-CM | POA: Diagnosis not present

## 2014-08-30 DIAGNOSIS — H409 Unspecified glaucoma: Secondary | ICD-10-CM | POA: Diagnosis not present

## 2014-08-30 DIAGNOSIS — H4011X0 Primary open-angle glaucoma, stage unspecified: Secondary | ICD-10-CM | POA: Diagnosis not present

## 2014-10-08 ENCOUNTER — Encounter: Payer: Self-pay | Admitting: Physician Assistant

## 2014-10-21 DIAGNOSIS — R42 Dizziness and giddiness: Secondary | ICD-10-CM | POA: Diagnosis not present

## 2014-11-17 ENCOUNTER — Other Ambulatory Visit: Payer: Self-pay | Admitting: Physician Assistant

## 2014-11-19 NOTE — Telephone Encounter (Signed)
Rx request to pharmacy/SLS  Please call patient and schedule appointment for Physical [Medicaid? 45 minute] that was due in November prior to future refills per provider/SLS Thanks.

## 2014-11-19 NOTE — Telephone Encounter (Signed)
Phone not in service.

## 2014-11-20 NOTE — Telephone Encounter (Signed)
Attempt to reach pt via phone, both contacts phone numbers are not in service/SLS 12.15.15

## 2014-11-29 ENCOUNTER — Other Ambulatory Visit: Payer: Self-pay | Admitting: Physician Assistant

## 2014-11-29 NOTE — Telephone Encounter (Signed)
Carvedilol refilled for one month, pt was supposed to come back 10/2014 for lab work. Labs due. JG//CMA

## 2014-12-23 DIAGNOSIS — R531 Weakness: Secondary | ICD-10-CM | POA: Diagnosis not present

## 2015-01-02 ENCOUNTER — Other Ambulatory Visit: Payer: Self-pay | Admitting: Physician Assistant

## 2015-01-02 NOTE — Telephone Encounter (Signed)
Rx request to pharmacy/SLS  

## 2015-01-22 ENCOUNTER — Other Ambulatory Visit: Payer: Self-pay | Admitting: Physician Assistant

## 2015-01-23 ENCOUNTER — Other Ambulatory Visit: Payer: Self-pay | Admitting: Physician Assistant

## 2015-01-23 NOTE — Telephone Encounter (Signed)
Rx request to pharmacy/SLS Requested drug refills are authorized, however, the patient needs further evaluation and/or laboratory testing before further refills are given. Ask her to make an appointment for this.  

## 2015-01-23 NOTE — Telephone Encounter (Signed)
#   no longer in service

## 2015-01-23 NOTE — Telephone Encounter (Signed)
Rx request to pharmacy/SLS Requested drug refills are authorized 30-day Only, however, the patient needs further evaluation and/or laboratory testing before further refills are given. Ask her to make an appointment for this.  Please call patient and arrange CPE, as patient was due in 11.2015, no further refills will be given w/o prior appt/SLS Thanks.

## 2015-02-07 ENCOUNTER — Other Ambulatory Visit: Payer: Self-pay | Admitting: Physician Assistant

## 2015-02-08 NOTE — Telephone Encounter (Signed)
Medication Detail      Disp Refills Start End     donepezil (ARICEPT) 10 MG tablet 30 tablet 0 01/23/2015     Sig: TAKE 1 TABLET (10 MG TOTAL) BY MOUTH AT BEDTIME.    Notes to Pharmacy: Requested drug refills are authorized 30-day Only, however, the patient needs further evaluation and/or laboratory testing before further refills are given. Ask her to make an appointment for this.    E-Prescribing Status: Receipt confirmed by pharmacy (01/23/2015 12:23 PM EST)   Pharmacy    [id number] - [place], [place] [address]   Rx request Denied, No Further Refills Without Prior Office Visit per provider/SLS Please call patient and arrange complete Physical office visit per provider instructions/SLS Thanks.

## 2015-02-08 NOTE — Telephone Encounter (Signed)
Phone # is not in service

## 2015-02-11 ENCOUNTER — Other Ambulatory Visit: Payer: Self-pay | Admitting: Physician Assistant

## 2015-02-22 ENCOUNTER — Other Ambulatory Visit: Payer: Self-pay | Admitting: Physician Assistant

## 2015-02-26 ENCOUNTER — Encounter (HOSPITAL_COMMUNITY): Payer: Self-pay | Admitting: Emergency Medicine

## 2015-02-26 ENCOUNTER — Emergency Department (HOSPITAL_COMMUNITY)
Admission: EM | Admit: 2015-02-26 | Discharge: 2015-02-27 | Disposition: A | Discharge status: Home / Self Care | Payer: Medicare Other | Attending: Emergency Medicine | Admitting: Emergency Medicine

## 2015-02-26 ENCOUNTER — Emergency Department (HOSPITAL_COMMUNITY): Payer: Medicare Other

## 2015-02-26 DIAGNOSIS — R05 Cough: Secondary | ICD-10-CM | POA: Diagnosis not present

## 2015-02-26 DIAGNOSIS — F0280 Dementia in other diseases classified elsewhere without behavioral disturbance: Secondary | ICD-10-CM | POA: Diagnosis not present

## 2015-02-26 DIAGNOSIS — Z79899 Other long term (current) drug therapy: Secondary | ICD-10-CM | POA: Diagnosis not present

## 2015-02-26 DIAGNOSIS — G309 Alzheimer's disease, unspecified: Secondary | ICD-10-CM | POA: Diagnosis not present

## 2015-02-26 DIAGNOSIS — K219 Gastro-esophageal reflux disease without esophagitis: Secondary | ICD-10-CM | POA: Diagnosis not present

## 2015-02-26 DIAGNOSIS — M170 Bilateral primary osteoarthritis of knee: Secondary | ICD-10-CM | POA: Insufficient documentation

## 2015-02-26 DIAGNOSIS — F4489 Other dissociative and conversion disorders: Secondary | ICD-10-CM | POA: Diagnosis not present

## 2015-02-26 DIAGNOSIS — I2510 Atherosclerotic heart disease of native coronary artery without angina pectoris: Secondary | ICD-10-CM | POA: Insufficient documentation

## 2015-02-26 DIAGNOSIS — I10 Essential (primary) hypertension: Secondary | ICD-10-CM | POA: Insufficient documentation

## 2015-02-26 DIAGNOSIS — R531 Weakness: Secondary | ICD-10-CM | POA: Diagnosis not present

## 2015-02-26 DIAGNOSIS — I6789 Other cerebrovascular disease: Secondary | ICD-10-CM | POA: Diagnosis not present

## 2015-02-26 DIAGNOSIS — Z792 Long term (current) use of antibiotics: Secondary | ICD-10-CM | POA: Insufficient documentation

## 2015-02-26 DIAGNOSIS — Z8673 Personal history of transient ischemic attack (TIA), and cerebral infarction without residual deficits: Secondary | ICD-10-CM | POA: Insufficient documentation

## 2015-02-26 DIAGNOSIS — I252 Old myocardial infarction: Secondary | ICD-10-CM | POA: Insufficient documentation

## 2015-02-26 DIAGNOSIS — H409 Unspecified glaucoma: Secondary | ICD-10-CM | POA: Insufficient documentation

## 2015-02-26 DIAGNOSIS — N390 Urinary tract infection, site not specified: Secondary | ICD-10-CM | POA: Diagnosis not present

## 2015-02-26 DIAGNOSIS — E785 Hyperlipidemia, unspecified: Secondary | ICD-10-CM | POA: Insufficient documentation

## 2015-02-26 DIAGNOSIS — R410 Disorientation, unspecified: Secondary | ICD-10-CM | POA: Diagnosis not present

## 2015-02-26 LAB — COMPREHENSIVE METABOLIC PANEL
ALBUMIN: 3.8 g/dL (ref 3.5–5.2)
ALK PHOS: 96 U/L (ref 39–117)
ALT: 15 U/L (ref 0–35)
ANION GAP: 7 (ref 5–15)
AST: 18 U/L (ref 0–37)
BUN: 15 mg/dL (ref 6–23)
CO2: 31 mmol/L (ref 19–32)
Calcium: 8.6 mg/dL (ref 8.4–10.5)
Chloride: 102 mmol/L (ref 96–112)
Creatinine, Ser: 0.97 mg/dL (ref 0.50–1.10)
GFR calc Af Amer: 66 mL/min — ABNORMAL LOW (ref >90)
GFR calc non Af Amer: 57 mL/min — ABNORMAL LOW (ref >90)
Glucose, Bld: 119 mg/dL — ABNORMAL HIGH (ref 70–99)
Potassium: 3.3 mmol/L — ABNORMAL LOW (ref 3.5–5.1)
Sodium: 140 mmol/L (ref 135–145)
TOTAL PROTEIN: 7.3 g/dL (ref 6.0–8.3)
Total Bilirubin: 0.6 mg/dL (ref 0.3–1.2)

## 2015-02-26 LAB — CBC WITH DIFFERENTIAL/PLATELET
BASOS ABS: 0 10*3/uL (ref 0.0–0.1)
BASOS PCT: 1 % (ref 0–1)
EOS PCT: 2 % (ref 0–5)
Eosinophils Absolute: 0.1 10*3/uL (ref 0.0–0.7)
HCT: 39.8 % (ref 36.0–46.0)
HEMOGLOBIN: 12.8 g/dL (ref 12.0–15.0)
Lymphocytes Relative: 19 % (ref 12–46)
Lymphs Abs: 1.4 10*3/uL (ref 0.7–4.0)
MCH: 29.9 pg (ref 26.0–34.0)
MCHC: 32.2 g/dL (ref 30.0–36.0)
MCV: 93 fL (ref 78.0–100.0)
MONO ABS: 0.8 10*3/uL (ref 0.1–1.0)
Monocytes Relative: 11 % (ref 3–12)
Neutro Abs: 5 10*3/uL (ref 1.7–7.7)
Neutrophils Relative %: 69 % (ref 43–77)
Platelets: 146 10*3/uL — ABNORMAL LOW (ref 150–400)
RBC: 4.28 MIL/uL (ref 3.87–5.11)
RDW: 14.3 % (ref 11.5–15.5)
WBC: 7.3 10*3/uL (ref 4.0–10.5)

## 2015-02-26 LAB — URINALYSIS, ROUTINE W REFLEX MICROSCOPIC
Glucose, UA: NEGATIVE mg/dL
Hgb urine dipstick: NEGATIVE
KETONES UR: NEGATIVE mg/dL
LEUKOCYTES UA: NEGATIVE
NITRITE: NEGATIVE
PROTEIN: NEGATIVE mg/dL
Specific Gravity, Urine: 1.028 (ref 1.005–1.030)
Urobilinogen, UA: 1 mg/dL (ref 0.0–1.0)
pH: 5.5 (ref 5.0–8.0)

## 2015-02-26 LAB — I-STAT TROPONIN, ED: Troponin i, poc: 0 ng/mL (ref 0.00–0.08)

## 2015-02-26 NOTE — ED Notes (Signed)
MD at bedside. 

## 2015-02-26 NOTE — ED Notes (Signed)
Took pt to the restroom, attempt unsuccessful.  Pt sts she is currently unable to urinate, but was wearing a brief and the brief was wet.  Pt changed and brought back to room. RN notfied.

## 2015-02-26 NOTE — ED Notes (Signed)
Per EMS: Pt lives at home with daughter. Pt c/o frequent urination and dry cough, onset yesterday. Daughter reports pt having increased confusion and weakness. Pt has hx of dementia. Denies dysuria.

## 2015-02-26 NOTE — ED Notes (Signed)
Bed: WA03 Expected date:  Expected time:  Means of arrival:  Comments: EMS- urinary frequency, UTI? Dementia

## 2015-02-26 NOTE — ED Provider Notes (Signed)
CSN: [id number]     Arrival date & time 02/26/15  1926 History   First MD Initiated Contact with Patient 02/26/15 2008     Chief Complaint  Patient presents with  . R/o UTI      (Consider location/radiation/quality/duration/timing/severity/associated sxs/prior Treatment) HPI Diane Frey is a 74 y.o black female who has a history of CVA, htn, dementia and MI who presents for confusion that is different from her baseline per her daughter. The daughter reports that she began coughing 2 days ago and yesterday she was running back and forth to the bathroom to urinate.  Today she found her in her chair when she came home from work and she had urinated on herself.  She was unresponsive to commands and the daughter had to help her from the chair.  She denies any chest pain, shortness of breath, abdominal pain, urinary symptoms or increased leg swelling.  Past Medical History  Diagnosis Date  . CVA 02/10/2008  . ENDOCARDITIS 09/29/2010  . HYPERLIPIDEMIA 02/14/2007  . HYPERTENSION 02/14/2007  . MYOCARDIAL INFARCTION, HX OF 02/14/2007  . OBESITY 01/16/2009  . OBSTRUCTIVE SLEEP APNEA 05/27/2007  . 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 CORONARY ARTERY DISEASE 02/14/2007  . DEMENTIA 01/20/2010  . CAD (coronary artery disease)   . Myocardial infarct   . Edema   . Alzheimer disease   . GERD (gastroesophageal reflux disease)   . Glaucoma   . Gout   . Unspecified urinary incontinence   . Postmenopausal bleeding   . Morbid obesity   . Lack of coordination   . Aneurysm     Head   Past Surgical History  Procedure Laterality Date  . Tubal ligation     Family History  Problem Relation Age of Onset  . Heart failure Mother     Deceased  . Kidney disease Brother     Deceased  . Heart disease Father     Deceased  . Cancer Mother   . Stroke Sister   . Hypertension Brother   . Hypertension Sister   . Heart disease Sister     Deceased  . Hypertension Sister     x2  . Hypertension Son     x2  . Glaucoma Son    History  Substance Use Topics  . Smoking status: Never Smoker   . Smokeless tobacco: Never Used  . Alcohol Use: No   OB History    Gravida Para Term Preterm AB TAB SAB Ectopic Multiple Living   7 7 7       7      Review of Systems  Constitutional: Negative for chills.  Gastrointestinal: Negative for nausea and vomiting.  Neurological: Negative for dizziness, syncope, weakness and numbness.  All other systems reviewed and are negative.     Allergies  Review of patient's allergies indicates no known allergies.  Home Medications   Prior to Admission medications   Medication Sig Start Date End Date Taking? Authorizing Provider  acetaminophen (TYLENOL) 325 MG tablet Take 650 mg by mouth 2 (two) times daily.   Yes Historical Provider, MD  amLODipine (NORVASC) 5 MG tablet TAKE 1 TABLET (5 MG TOTAL) BY MOUTH DAILY. 02/22/15  Yes Brunetta Jeans, PA-C  atorvastatin (LIPITOR) 40 MG tablet TAKE 1 TABLET (40 MG TOTAL) BY MOUTH DAILY. 02/22/15  Yes Brunetta Jeans, PA-C  Calcium Carbonate-Vit D-Min (CALTRATE 600+D PLUS) 600-400 MG-UNIT per tablet Take 1 tablet by mouth daily.     Yes Historical Provider, MD  carvedilol (COREG) 25 MG tablet TAKE 1 TABLET (25 MG TOTAL) BY MOUTH 2 (TWO) TIMES DAILY WITH A MEAL. 01/02/15  Yes Brunetta Jeans, PA-C  [facility] ASPIRIN 81 MG chewable tablet TAKE 1 TABLET (81 MG TOTAL) BY MOUTH DAILY. 02/22/15  Yes Brunetta Jeans, PA-C  donepezil (ARICEPT) 10 MG tablet TAKE 1 TABLET (10 MG TOTAL) BY MOUTH AT BEDTIME. 02/22/15  Yes Brunetta Jeans, PA-C  ferrous sulfate 325 (65 FE) MG tablet TAKE 1 TABLET (325 MG TOTAL) BY MOUTH 2 (TWO) TIMES DAILY. Patient taking differently: TAKE 1 TABLET (325 MG TOTAL) BY MOUTH ONCE DAILY. 02/22/15  Yes Brunetta Jeans, PA-C  furosemide (LASIX) 40 MG tablet TAKE 1 TABLET (40 MG TOTAL) BY MOUTH DAILY. 02/22/15  Yes Brunetta Jeans, PA-C  guaifenesin (ROBITUSSIN) 100 MG/5ML syrup Take 200 mg by mouth  every 4 (four) hours as needed for cough.   Yes Historical Provider, MD  latanoprost (XALATAN) 0.005 % ophthalmic solution Place 1 drop into both eyes at bedtime.   Yes Historical Provider, MD  lisinopril (PRINIVIL,ZESTRIL) 40 MG tablet TAKE 1 TABLET (40 MG TOTAL) BY MOUTH DAILY. 02/22/15  Yes Brunetta Jeans, PA-C  Multiple Vitamin (MULTIVITAMIN) capsule Take 1 capsule by mouth daily.     Yes Historical Provider, MD  NAMENDA XR 28 MG CP24 24 hr capsule TAKE 28 MG BY MOUTH AT BEDTIME. 02/22/15  Yes Brunetta Jeans, PA-C  omeprazole (PRILOSEC) 20 MG capsule TAKE 1 CAPSULE (20 MG TOTAL) BY MOUTH DAILY. 02/22/15  Yes Brunetta Jeans, PA-C  potassium chloride SA (KLOR-CON M20) 20 MEQ tablet Take 1 tablet (20 mEq total) by mouth daily. 07/12/14  Yes Brunetta Jeans, PA-C  timolol (BETIMOL) 0.5 % ophthalmic solution Place 1 drop into both eyes 2 (two) times daily.   Yes Historical Provider, MD  ciprofloxacin (CIPRO) 250 MG tablet Take 1 tablet (250 mg total) by mouth 2 (two) times daily. Patient not taking: Reported on 02/26/2015 07/12/14   Brunetta Jeans, PA-C   BP 129/55 mmHg  Pulse 73  Temp(Src) 99 F (37.2 C) (Oral)  Resp 13  SpO2 94% Physical Exam  Constitutional: She is oriented to person, place, and time. She appears well-developed and well-nourished.  HENT:  Head: Normocephalic and atraumatic.  Mouth/Throat: Oropharynx is clear and moist.  Eyes: Conjunctivae and EOM are normal.  Neck: Normal range of motion. Neck supple.  Cardiovascular: Normal rate, regular rhythm and normal heart sounds.   Pulmonary/Chest: Effort normal and breath sounds normal.  Abdominal: Soft. There is no tenderness.  Musculoskeletal: Normal range of motion.  Neurological: She is alert and oriented to person, place, and time. She has normal strength.  Good strength in upper and lower extremities bilaterally.  No facial droop, cranial nerves III-XII in tact.  Skin: Skin is warm and dry.  Nursing note and vitals  reviewed.   ED Course  Procedures (including critical care time) Labs Review Labs Reviewed  URINALYSIS, ROUTINE W REFLEX MICROSCOPIC - Abnormal; Notable for the following:    Bilirubin Urine SMALL (*)    All other components within normal limits  CBC WITH DIFFERENTIAL/PLATELET - Abnormal; Notable for the following:    Platelets 146 (*)    All other components within normal limits  COMPREHENSIVE METABOLIC PANEL - Abnormal; Notable for the following:    Potassium 3.3 (*)    Glucose, Bld 119 (*)    GFR calc non Af Amer 57 (*)    GFR calc  Af Amer 66 (*)    All other components within normal limits  URINE CULTURE  I-STAT TROPOININ, ED    Imaging Review Dg Chest 2 View  02/26/2015   CLINICAL DATA:  Cough, weakness, confusion  EXAM: CHEST  2 VIEW  COMPARISON:  10/02/2010  FINDINGS: Lungs are clear.  No pleural effusion or pneumothorax.  The heart is normal in size.  Degenerative changes of the visualized thoracolumbar spine.  IMPRESSION: No evidence of acute cardiopulmonary disease.   Electronically Signed   By: Julian Hy M.D.   On: 02/26/2015 20:57   Ct Head Wo Contrast  02/27/2015   CLINICAL DATA:  Increasing confusion and weakness for 1 week. History of dimension.  EXAM: CT HEAD WITHOUT CONTRAST  TECHNIQUE: Contiguous axial images were obtained from the base of the skull through the vertex without intravenous contrast.  COMPARISON:  Most recent head CT 02/06/2010, brain MRI 06/03/2012  FINDINGS: Atrophy, chronic small vessel ischemic change, and ventriculomegaly, not significantly changed from prior exams. Focal encephalomalacia in the left frontal lobe is also unchanged. There is no intracranial hemorrhage, mass effect, or midline shift. No extra-axial fluid collection. There is diffuse mucosal thickening involving the ethmoid air cells with small fluid levels in both maxillary sinuses. Mastoid air cells are well aerated. Calvarium is otherwise intact.  IMPRESSION: 1. Stable chronic  change without acute intracranial abnormality. 2. Sinusitis, may be acute.   Electronically Signed   By: Jeb Levering M.D.   On: 02/27/2015 00:31     EKG Interpretation None     ED ECG REPORT   Date: 02/27/2015  Rate: 71  Rhythm:Sinus rhythm  QRS Axis: 80 ms  Intervals: PR 149 ms  ST/T Wave abnormalities:   Conduction Disutrbances: low voltage, pericordial leads  Narrative Interpretation:   Old EKG Reviewed:  I have personally reviewed the EKG tracing and agree with the computerized printout as noted.  MDM   Final diagnoses:  Weakness   Patient is here for weakness.  Her exam is normal.  She does have a history of dementia but is able to converse and explain how she is feeling. She is able to ambulate to the bathroom with assistance.   Her labs are stable.  Vitals are stable. No UTI. CXR shows no pneumonia or pleural effusion. Her head CT shows no intracranial abnormality.   Daughter and patient agree with plan to go home.  Patient is at baseline. They will follow up with pcp.   Ottie Glazier, PA-C 02/28/15 1358  Davonna Belling, MD 03/01/15 [phone]

## 2015-02-27 NOTE — Discharge Instructions (Signed)

## 2015-02-28 LAB — URINE CULTURE
COLONY COUNT: NO GROWTH
Culture: NO GROWTH

## 2015-03-15 DIAGNOSIS — H4011X3 Primary open-angle glaucoma, severe stage: Secondary | ICD-10-CM | POA: Diagnosis not present

## 2015-03-18 DIAGNOSIS — H4011X3 Primary open-angle glaucoma, severe stage: Secondary | ICD-10-CM | POA: Diagnosis not present

## 2015-03-23 ENCOUNTER — Other Ambulatory Visit: Payer: Self-pay | Admitting: Physician Assistant

## 2015-03-25 ENCOUNTER — Other Ambulatory Visit: Payer: Self-pay | Admitting: Physician Assistant

## 2015-03-26 NOTE — Telephone Encounter (Signed)
Rx request to pharmacy/SLS **NO FURTHER REFILLS WILL BE GIVEN WITHOUT OFFICE VISIT**  Please call patient and arrange F/U prior to to future refill authorizations/SLS Thanks.

## 2015-03-28 NOTE — Telephone Encounter (Signed)
LVM advising pt to schedule appointment

## 2015-04-03 ENCOUNTER — Other Ambulatory Visit: Payer: Self-pay | Admitting: Physician Assistant

## 2015-04-12 ENCOUNTER — Other Ambulatory Visit: Payer: Self-pay | Admitting: Physician Assistant

## 2015-04-19 ENCOUNTER — Other Ambulatory Visit: Payer: Self-pay | Admitting: Physician Assistant

## 2015-04-19 ENCOUNTER — Telehealth: Payer: Self-pay | Admitting: Physician Assistant

## 2015-04-19 NOTE — Telephone Encounter (Signed)
eror/njr

## 2015-04-27 ENCOUNTER — Other Ambulatory Visit: Payer: Self-pay | Admitting: Physician Assistant

## 2015-05-03 ENCOUNTER — Encounter: Payer: Self-pay | Admitting: Physician Assistant

## 2015-05-03 ENCOUNTER — Ambulatory Visit (INDEPENDENT_AMBULATORY_CARE_PROVIDER_SITE_OTHER): Payer: Medicare Other | Admitting: Physician Assistant

## 2015-05-03 VITALS — BP 166/62 | HR 54 | Temp 98.8°F | Ht 62.0 in | Wt 233.0 lb

## 2015-05-03 DIAGNOSIS — I10 Essential (primary) hypertension: Secondary | ICD-10-CM | POA: Diagnosis not present

## 2015-05-03 DIAGNOSIS — F0390 Unspecified dementia without behavioral disturbance: Secondary | ICD-10-CM | POA: Diagnosis not present

## 2015-05-03 LAB — BASIC METABOLIC PANEL
BUN: 13 mg/dL (ref 6–23)
CALCIUM: 9.1 mg/dL (ref 8.4–10.5)
CHLORIDE: 106 meq/L (ref 96–112)
CO2: 30 meq/L (ref 19–32)
Creatinine, Ser: 0.98 mg/dL (ref 0.40–1.20)
GFR: 71.7 mL/min (ref >60.00)
GLUCOSE: 96 mg/dL (ref 70–99)
Potassium: 4 mEq/L (ref 3.5–5.1)
SODIUM: 140 meq/L (ref 135–145)

## 2015-05-03 MED ORDER — CARVEDILOL 25 MG PO TABS: ORAL_TABLET | ORAL | Status: DC

## 2015-05-03 MED ORDER — MEMANTINE HCL ER 28 MG PO CP24
ORAL_CAPSULE | ORAL | Status: DC
Start: 2015-05-03 — End: 2015-10-08

## 2015-05-03 MED ORDER — FUROSEMIDE 40 MG PO TABS: 40.0000 mg | ORAL_TABLET | Freq: Every day | ORAL | Status: DC

## 2015-05-03 MED ORDER — AMLODIPINE BESYLATE 5 MG PO TABS: 5.0000 mg | ORAL_TABLET | Freq: Every day | ORAL | Status: DC

## 2015-05-03 MED ORDER — LISINOPRIL 40 MG PO TABS: 40.0000 mg | ORAL_TABLET | Freq: Every day | ORAL | Status: DC

## 2015-05-03 MED ORDER — POTASSIUM CHLORIDE CRYS ER 20 MEQ PO TBCR: 20.0000 meq | EXTENDED_RELEASE_TABLET | Freq: Every day | ORAL | Status: DC

## 2015-05-03 MED ORDER — ACETAMINOPHEN 325 MG PO TABS: 650.0000 mg | ORAL_TABLET | Freq: Two times a day (BID) | ORAL | Status: DC

## 2015-05-03 MED ORDER — FERROUS SULFATE 325 (65 FE) MG PO TABS: ORAL_TABLET | ORAL | Status: DC

## 2015-05-03 MED ORDER — ASPIRIN 81 MG PO CHEW: CHEWABLE_TABLET | ORAL | Status: DC

## 2015-05-03 MED ORDER — ATORVASTATIN CALCIUM 40 MG PO TABS: 40.0000 mg | ORAL_TABLET | Freq: Every day | ORAL | Status: DC

## 2015-05-03 MED ORDER — DONEPEZIL HCL 10 MG PO TABS: 10.0000 mg | ORAL_TABLET | Freq: Every day | ORAL | Status: DC

## 2015-05-03 NOTE — Progress Notes (Signed)
Patient presents to clinic today for medication management. Patient has not been seen in over 6 months regarding chronic hypertension and alzheimer's dementia. Patient is accompanied by her daughter who manages her medications and is her primary care giver. Patient and daughter endorses patient is taking her medications as directed .Patient denies chest pain, palpitations, lightheadedness, dizziness, vision changes or frequent headaches.  Daughter has notices some mild decline in memory from her baseline. Has not followed up with Neurology recently.  Past Medical History  Diagnosis Date  . CVA 02/10/2008  . ENDOCARDITIS 09/29/2010  . HYPERLIPIDEMIA 02/14/2007  . HYPERTENSION 02/14/2007  . MYOCARDIAL INFARCTION, HX OF 02/14/2007  . OBESITY 01/16/2009  . OBSTRUCTIVE SLEEP APNEA 05/27/2007  . 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 CORONARY ARTERY DISEASE 02/14/2007  . DEMENTIA 01/20/2010  . CAD (coronary artery disease)   . Myocardial infarct   . Edema   . Alzheimer disease   . GERD (gastroesophageal reflux disease)   . Glaucoma   . Gout   . Unspecified urinary incontinence   . Postmenopausal bleeding   . Morbid obesity   . Lack of coordination   . Aneurysm     Head    Current Outpatient Prescriptions on File Prior to Visit  Medication Sig Dispense Refill  . Calcium Carbonate-Vit D-Min (CALTRATE 600+D PLUS) 600-400 MG-UNIT per tablet Take 1 tablet by mouth daily.      Marland Kitchen latanoprost (XALATAN) 0.005 % ophthalmic solution Place 1 drop into both eyes at bedtime.    . Multiple Vitamin (MULTIVITAMIN) capsule Take 1 capsule by mouth daily.      Marland Kitchen omeprazole (PRILOSEC) 20 MG capsule Take 1 capsule (20 mg total) by mouth daily. **NO FURTHER REFILLS WILL BE GIVEN WITHOUT OFFICE VISIT** 30 capsule 0  . timolol (BETIMOL) 0.5 % ophthalmic solution Place 1 drop into both eyes 2 (two) times daily.     No current facility-administered medications on file prior to visit.    No Known  Allergies  Family History  Problem Relation Age of Onset  . Heart failure Mother     Deceased  . Kidney disease Brother     Deceased  . Heart disease Father     Deceased  . Cancer Mother   . Stroke Sister   . Hypertension Brother   . Hypertension Sister   . Heart disease Sister     Deceased  . Hypertension Sister     x2  . Hypertension Son     x2  . Glaucoma Son     History   Social History  . Marital Status: Widowed    Spouse Name: N/A  . Number of Children: N/A  . Years of Education: N/A   Social History Main Topics  . Smoking status: Never Smoker   . Smokeless tobacco: Never Used  . Alcohol Use: No  . Drug Use: No  . Sexual Activity: No   Other Topics Concern  . None   Social History Narrative   Review of Systems - See HPI.  All other ROS are negative.  BP 166/62 mmHg  Pulse 54  Temp(Src) 98.8 F (37.1 C) (Oral)  Ht '5\' 2"'  (1.575 m)  Wt 233 lb (105.688 kg)  BMI 42.61 kg/m2  SpO2 98%  Physical Exam  Constitutional: She is well-developed, well-nourished, and in no distress.  HENT:  Head: Normocephalic and atraumatic.  Eyes: Conjunctivae are normal.  Cardiovascular: Normal rate, normal heart sounds and intact distal pulses.   Pulmonary/Chest: Effort normal  and breath sounds normal. No respiratory distress. She has no wheezes. She has no rales. She exhibits no tenderness.  Neurological:  Alert and oriented to person and place but not time.  Skin: Skin is warm and dry. No rash noted.  Psychiatric: Affect normal.  Vitals reviewed.   Recent Results (from the past 2160 hour(s))  CBC with Differential     Status: Abnormal   Collection Time: 02/26/15  8:35 PM  Result Value Ref Range   WBC 7.3 4.0 - 10.5 K/uL   RBC 4.28 3.87 - 5.11 MIL/uL   Hemoglobin 12.8 12.0 - 15.0 g/dL   HCT 39.8 36.0 - 46.0 %   MCV 93.0 78.0 - 100.0 fL   MCH 29.9 26.0 - 34.0 pg   MCHC 32.2 30.0 - 36.0 g/dL   RDW 14.3 11.5 - 15.5 %   Platelets 146 (L) 150 - 400 K/uL    Neutrophils Relative % 69 43 - 77 %   Neutro Abs 5.0 1.7 - 7.7 K/uL   Lymphocytes Relative 19 12 - 46 %   Lymphs Abs 1.4 0.7 - 4.0 K/uL   Monocytes Relative 11 3 - 12 %   Monocytes Absolute 0.8 0.1 - 1.0 K/uL   Eosinophils Relative 2 0 - 5 %   Eosinophils Absolute 0.1 0.0 - 0.7 K/uL   Basophils Relative 1 0 - 1 %   Basophils Absolute 0.0 0.0 - 0.1 K/uL  Comprehensive metabolic panel     Status: Abnormal   Collection Time: 02/26/15  8:35 PM  Result Value Ref Range   Sodium 140 135 - 145 mmol/L   Potassium 3.3 (L) 3.5 - 5.1 mmol/L   Chloride 102 96 - 112 mmol/L   CO2 31 19 - 32 mmol/L   Glucose, Bld 119 (H) 70 - 99 mg/dL   BUN 15 6 - 23 mg/dL   Creatinine, Ser 0.97 0.50 - 1.10 mg/dL   Calcium 8.6 8.4 - 10.5 mg/dL   Total Protein 7.3 6.0 - 8.3 g/dL   Albumin 3.8 3.5 - 5.2 g/dL   AST 18 0 - 37 U/L   ALT 15 0 - 35 U/L   Alkaline Phosphatase 96 39 - 117 U/L   Total Bilirubin 0.6 0.3 - 1.2 mg/dL   GFR calc non Af Amer 57 (L) >90 mL/min   GFR calc Af Amer 66 (L) >90 mL/min    Comment: (NOTE) The eGFR has been calculated using the CKD EPI equation. This calculation has not been validated in all clinical situations. eGFR's persistently <90 mL/min signify possible Chronic Kidney Disease.    Anion gap 7 5 - 15  I-Stat Troponin, ED (not at [facility])     Status: None   Collection Time: 02/26/15  8:38 PM  Result Value Ref Range   Troponin i, poc 0.00 0.00 - 0.08 ng/mL   Comment 3            Comment: Due to the release kinetics of cTnI, a negative result within the first hours of the onset of symptoms does not rule out myocardial infarction with certainty. If myocardial infarction is still suspected, repeat the test at appropriate intervals.   Urinalysis, Routine w reflex microscopic     Status: Abnormal   Collection Time: 02/26/15 10:12 PM  Result Value Ref Range   Color, Urine YELLOW YELLOW   APPearance CLEAR CLEAR   Specific Gravity, Urine 1.028 1.005 - 1.030   pH 5.5 5.0 - 8.0    Glucose, UA  NEGATIVE NEGATIVE mg/dL   Hgb urine dipstick NEGATIVE NEGATIVE   Bilirubin Urine SMALL (A) NEGATIVE   Ketones, ur NEGATIVE NEGATIVE mg/dL   Protein, ur NEGATIVE NEGATIVE mg/dL   Urobilinogen, UA 1.0 0.0 - 1.0 mg/dL   Nitrite NEGATIVE NEGATIVE   Leukocytes, UA NEGATIVE NEGATIVE    Comment: MICROSCOPIC NOT DONE ON URINES WITH NEGATIVE PROTEIN, BLOOD, LEUKOCYTES, NITRITE, OR GLUCOSE <1000 mg/dL.  Urine culture     Status: None   Collection Time: 02/26/15 10:12 PM  Result Value Ref Range   Specimen Description URINE, CLEAN CATCH    Special Requests NONE    Colony Count NO GROWTH Performed at [facility]     Culture NO GROWTH Performed at [facility]     Report Status 02/28/2015 FINAL   Basic Metabolic Panel (BMET)     Status: None   Collection Time: 05/03/15  2:10 PM  Result Value Ref Range   Sodium 140 135 - 145 mEq/L   Potassium 4.0 3.5 - 5.1 mEq/L   Chloride 106 96 - 112 mEq/L   CO2 30 19 - 32 mEq/L   Glucose, Bld 96 70 - 99 mg/dL   BUN 13 6 - 23 mg/dL   Creatinine, Ser 0.98 0.40 - 1.20 mg/dL   Calcium 9.1 8.4 - 10.5 mg/dL   GFR 71.70 >60.00 mL/min    Assessment/Plan: Essential hypertension Patient has not taken medications the morning of appointment.  Discussed importance of medication compliance with patient and daughter. Will check BMP today.   Will continue current regimen but will reassess medicated BP level at CPE that will be scheduled for 2-3 weeks.   Dementia Mild decline.  Will continue current regimen but will attempt to discontinue her Omeprazole and see if we can control GERD with dietary measures if there is any recurrence of symptoms.  She has been on this medication for so long that it may honestly not be needed anymore.

## 2015-05-03 NOTE — Patient Instructions (Signed)
Please continue medications as directed except for the Prilosec.  We will stop this medication.  Follow-up with me ASAP for a CPE.

## 2015-05-03 NOTE — Progress Notes (Signed)
Pre visit review using our clinic review tool, if applicable. No additional management support is needed unless otherwise documented below in the visit note. 

## 2015-05-06 DIAGNOSIS — I10 Essential (primary) hypertension: Secondary | ICD-10-CM | POA: Insufficient documentation

## 2015-05-06 NOTE — Assessment & Plan Note (Signed)
Patient has not taken medications the morning of appointment.  Discussed importance of medication compliance with patient and daughter. Will check BMP today.   Will continue current regimen but will reassess medicated BP level at CPE that will be scheduled for 2-3 weeks.

## 2015-05-06 NOTE — Assessment & Plan Note (Signed)
Mild decline.  Will continue current regimen but will attempt to discontinue her Omeprazole and see if we can control GERD with dietary measures if there is any recurrence of symptoms.  She has been on this medication for so long that it may honestly not be needed anymore.

## 2015-05-08 ENCOUNTER — Encounter: Payer: Self-pay | Admitting: *Deleted

## 2015-05-10 ENCOUNTER — Encounter: Payer: Self-pay | Admitting: Physician Assistant

## 2015-05-16 ENCOUNTER — Telehealth: Payer: Self-pay | Admitting: *Deleted

## 2015-05-16 NOTE — Telephone Encounter (Signed)
Unable to reach patient at time of Pre-Visit Call.  Unable to leave message because number is invalid or has changed per message.

## 2015-05-17 ENCOUNTER — Encounter: Payer: Self-pay | Admitting: Physician Assistant

## 2015-05-17 ENCOUNTER — Ambulatory Visit (INDEPENDENT_AMBULATORY_CARE_PROVIDER_SITE_OTHER): Payer: Medicare Other | Admitting: Physician Assistant

## 2015-05-17 VITALS — BP 142/67 | HR 50 | Temp 98.5°F | Resp 14 | Ht 62.0 in | Wt 226.1 lb

## 2015-05-17 DIAGNOSIS — E785 Hyperlipidemia, unspecified: Secondary | ICD-10-CM | POA: Diagnosis not present

## 2015-05-17 DIAGNOSIS — R8290 Unspecified abnormal findings in urine: Secondary | ICD-10-CM | POA: Diagnosis not present

## 2015-05-17 DIAGNOSIS — Z23 Encounter for immunization: Secondary | ICD-10-CM | POA: Diagnosis not present

## 2015-05-17 DIAGNOSIS — Z131 Encounter for screening for diabetes mellitus: Secondary | ICD-10-CM

## 2015-05-17 DIAGNOSIS — Z0000 Encounter for general adult medical examination without abnormal findings: Secondary | ICD-10-CM

## 2015-05-17 DIAGNOSIS — F0390 Unspecified dementia without behavioral disturbance: Secondary | ICD-10-CM | POA: Diagnosis not present

## 2015-05-17 DIAGNOSIS — R8299 Other abnormal findings in urine: Secondary | ICD-10-CM | POA: Diagnosis not present

## 2015-05-17 DIAGNOSIS — Z13820 Encounter for screening for osteoporosis: Secondary | ICD-10-CM

## 2015-05-17 LAB — HEPATIC FUNCTION PANEL
ALT: 12 U/L (ref 0–35)
AST: 13 U/L (ref 0–37)
Albumin: 3.9 g/dL (ref 3.5–5.2)
Alkaline Phosphatase: 78 U/L (ref 39–117)
BILIRUBIN TOTAL: 0.6 mg/dL (ref 0.2–1.2)
Bilirubin, Direct: 0.1 mg/dL (ref 0.0–0.3)
Total Protein: 6.9 g/dL (ref 6.0–8.3)

## 2015-05-17 LAB — POCT URINALYSIS DIPSTICK
BILIRUBIN UA: NEGATIVE
Glucose, UA: NEGATIVE
Ketones, UA: NEGATIVE
Leukocytes, UA: NEGATIVE
Nitrite, UA: NEGATIVE
PROTEIN UA: NEGATIVE
RBC UA: POSITIVE
SPEC GRAV UA: 1.025
Urobilinogen, UA: 0.2
pH, UA: 5.5

## 2015-05-17 LAB — LIPID PANEL
Cholesterol: 164 mg/dL (ref 0–200)
HDL: 56.5 mg/dL (ref >39.00)
LDL CALC: 93 mg/dL (ref 0–99)
NonHDL: 107.5
TRIGLYCERIDES: 75 mg/dL (ref 0.0–149.0)
Total CHOL/HDL Ratio: 3
VLDL: 15 mg/dL (ref 0.0–40.0)

## 2015-05-17 LAB — BASIC METABOLIC PANEL WITH GFR
BUN: 19 mg/dL (ref 6–23)
CO2: 28 mEq/L (ref 19–32)
CREATININE: 0.99 mg/dL (ref 0.50–1.10)
Calcium: 9.4 mg/dL (ref 8.4–10.5)
Chloride: 106 mEq/L (ref 96–112)
GFR, EST NON AFRICAN AMERICAN: 57 mL/min — AB
GFR, Est African American: 66 mL/min
Glucose, Bld: 87 mg/dL (ref 70–99)
Potassium: 3.9 mEq/L (ref 3.5–5.3)
SODIUM: 145 meq/L (ref 135–145)

## 2015-05-17 NOTE — Progress Notes (Signed)
Subjective:    Diane Frey is a 74 y.o. female who presents for Medicare Annual/Subsequent preventive examination.  Preventive Screening-Counseling & Management  Tobacco History  Smoking status  . Never Smoker   Smokeless tobacco  . Never Used     Problems Prior to Visit 1. Dementia -- Chronic.  Not currently followed by Neurology.  Is on combination of Arivept and Namenda which has previously worked well. Now caregiver has noted patient having auditory hallucinations in the evenings.  Patient denies such. Denies urinary urgency, frequency or dysuria.  Denies fever, chills, malaise.  Current Problems (verified) Patient Active Problem List   Diagnosis Date Noted  . Essential hypertension 05/06/2015  . Postmenopausal bleeding 07/05/2013  . Edema 03/04/2011  . STREP INF CCE & UNS SITE GROUP D [ENTEROCOCCUS] 09/29/2010  . ENDOCARDITIS 09/29/2010  . Dementia 01/20/2010  . 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 OBESITY 01/16/2009  . CVA 02/10/2008  . OBSTRUCTIVE SLEEP APNEA 05/27/2007  . HYPERLIPIDEMIA 02/14/2007  . MYOCARDIAL INFARCTION, HX OF 02/14/2007  . CORONARY ARTERY DISEASE 02/14/2007    Medications Prior to Visit Current Outpatient Prescriptions on File Prior to Visit  Medication Sig Dispense Refill  . acetaminophen (TYLENOL) 325 MG tablet Take 2 tablets (650 mg total) by mouth 2 (two) times daily. 100 tablet 0  . amLODipine (NORVASC) 5 MG tablet Take 1 tablet (5 mg total) by mouth daily. 30 tablet 5  . aspirin ([employer] CHILDRENS ASPIRIN) 81 MG chewable tablet TAKE 1 TABLET (81 MG TOTAL) BY MOUTH DAILY. 36 tablet 5  . atorvastatin (LIPITOR) 40 MG tablet Take 1 tablet (40 mg total) by mouth daily. 30 tablet 5  . Calcium Carbonate-Vit D-Min (CALTRATE 600+D PLUS) 600-400 MG-UNIT per tablet Take 1 tablet by mouth daily.      . carvedilol (COREG) 25 MG tablet TAKE 1 TABLET (25 MG TOTAL) BY MOUTH 2 (TWO) TIMES DAILY WITH A MEAL. 60 tablet 5  . donepezil  (ARICEPT) 10 MG tablet Take 1 tablet (10 mg total) by mouth at bedtime. 30 tablet 5  . ferrous sulfate 325 (65 FE) MG tablet TAKE 1 TABLET (325 MG TOTAL) BY MOUTH ONCE DAILY. 30 tablet 5  . furosemide (LASIX) 40 MG tablet Take 1 tablet (40 mg total) by mouth daily. 30 tablet 5  . latanoprost (XALATAN) 0.005 % ophthalmic solution Place 1 drop into both eyes at bedtime.    Marland Kitchen lisinopril (PRINIVIL,ZESTRIL) 40 MG tablet Take 1 tablet (40 mg total) by mouth daily. 30 tablet 5  . memantine (NAMENDA XR) 28 MG CP24 24 hr capsule TAKE 28 MG BY MOUTH AT BEDTIME. 30 capsule 2  . Multiple Vitamin (MULTIVITAMIN) capsule Take 1 capsule by mouth daily.      . potassium chloride SA (KLOR-CON M20) 20 MEQ tablet Take 1 tablet (20 mEq total) by mouth daily. 30 tablet 11  . timolol (BETIMOL) 0.5 % ophthalmic solution Place 1 drop into both eyes 2 (two) times daily.    Marland Kitchen omeprazole (PRILOSEC) 20 MG capsule Take 1 capsule (20 mg total) by mouth daily. **NO FURTHER REFILLS WILL BE GIVEN WITHOUT OFFICE VISIT** (Patient not taking: Reported on 05/17/2015) 30 capsule 0   No current facility-administered medications on file prior to visit.    Current Medications (verified) Current Outpatient Prescriptions  Medication Sig Dispense Refill  . acetaminophen (TYLENOL) 325 MG tablet Take 2 tablets (650 mg total) by mouth 2 (two) times daily. 100 tablet 0  . amLODipine (NORVASC) 5 MG tablet Take 1  tablet (5 mg total) by mouth daily. 30 tablet 5  . aspirin ([employer] CHILDRENS ASPIRIN) 81 MG chewable tablet TAKE 1 TABLET (81 MG TOTAL) BY MOUTH DAILY. 36 tablet 5  . atorvastatin (LIPITOR) 40 MG tablet Take 1 tablet (40 mg total) by mouth daily. 30 tablet 5  . Calcium Carbonate-Vit D-Min (CALTRATE 600+D PLUS) 600-400 MG-UNIT per tablet Take 1 tablet by mouth daily.      . carvedilol (COREG) 25 MG tablet TAKE 1 TABLET (25 MG TOTAL) BY MOUTH 2 (TWO) TIMES DAILY WITH A MEAL. 60 tablet 5  . donepezil (ARICEPT) 10 MG tablet Take 1 tablet (10  mg total) by mouth at bedtime. 30 tablet 5  . ferrous sulfate 325 (65 FE) MG tablet TAKE 1 TABLET (325 MG TOTAL) BY MOUTH ONCE DAILY. 30 tablet 5  . furosemide (LASIX) 40 MG tablet Take 1 tablet (40 mg total) by mouth daily. 30 tablet 5  . latanoprost (XALATAN) 0.005 % ophthalmic solution Place 1 drop into both eyes at bedtime.    Marland Kitchen lisinopril (PRINIVIL,ZESTRIL) 40 MG tablet Take 1 tablet (40 mg total) by mouth daily. 30 tablet 5  . memantine (NAMENDA XR) 28 MG CP24 24 hr capsule TAKE 28 MG BY MOUTH AT BEDTIME. 30 capsule 2  . Multiple Vitamin (MULTIVITAMIN) capsule Take 1 capsule by mouth daily.      . potassium chloride SA (KLOR-CON M20) 20 MEQ tablet Take 1 tablet (20 mEq total) by mouth daily. 30 tablet 11  . timolol (BETIMOL) 0.5 % ophthalmic solution Place 1 drop into both eyes 2 (two) times daily.    Marland Kitchen omeprazole (PRILOSEC) 20 MG capsule Take 1 capsule (20 mg total) by mouth daily. **NO FURTHER REFILLS WILL BE GIVEN WITHOUT OFFICE VISIT** (Patient not taking: Reported on 05/17/2015) 30 capsule 0   No current facility-administered medications for this visit.     Allergies (verified) Review of patient's allergies indicates no known allergies.   PAST HISTORY  Family History Family History  Problem Relation Age of Onset  . Heart failure Mother     Deceased  . Kidney disease Brother     Deceased  . Heart disease Father     Deceased  . Cancer Mother   . Stroke Sister   . Hypertension Brother   . Hypertension Sister   . Heart disease Sister     Deceased  . Hypertension Sister     x2  . Hypertension Son     x2  . Glaucoma Son     Social History History  Substance Use Topics  . Smoking status: Never Smoker   . Smokeless tobacco: Never Used  . Alcohol Use: No    Are there smokers in your home (other than you)? No  Risk Factors Current exercise habits: The patient does not participate in regular exercise at present.  Dietary issues discussed: Discussed well-balanced  diet and good hydration.   Cardiac risk factors: advanced age (older than 65 for men, 50 for women), dyslipidemia, family history of premature cardiovascular disease, obesity (BMI >= 30 kg/m2) and sedentary lifestyle.  Depression Screen (Note: if answer to either of the following is "Yes", a more complete depression screening is indicated)   Over the past two weeks, have you felt down, depressed or hopeless? No  Over the past two weeks, have you felt little interest or pleasure in doing things? No  Have you lost interest or pleasure in daily life? No  Do you often feel hopeless? No  Do you cry easily over simple problems? No  Activities of Daily Living In your present state of health, do you have any difficulty performing the following activities?: Patient limited due to increased fall risk and advancing dementia. Driving? Yes Managing money?  Yes Feeding yourself? Yes Getting from bed to chair? Yes Climbing a flight of stairs? Yes Preparing food and eating?: Yes Bathing or showering? Yes Getting dressed: Yes Getting to the toilet? Yes Using the toilet:No Moving around from place to place: Yes In the past year have you fallen or had a near fall?:No   Are you sexually active?  No  Do you have more than one partner?  N/A  Hearing Difficulties: No Do you often ask people to speak up or repeat themselves? No Do you experience ringing or noises in your ears? No Do you have difficulty understanding soft or whispered voices? No   Do you feel that you have a problem with memory? Yes -- Patient with Dementia  Do you often misplace items? Yes  Do you feel safe at home?  Yes  Cognitive Testing  Alert? Yes  Normal Appearance?Yes  Oriented to person? Yes  Place? Yes   Time? No  Recall of three objects?  Yes  Can perform simple calculations? Yes  Displays appropriate judgment?Yes   Advanced Directives have been discussed with the patient? Yes  List the Names of Other  Physician/Practitioners you currently use: 1.  None  Indicate any recent Medical Services you may have received from other than [facility] providers in the past year (date may be approximate).  Immunization History  Administered Date(s) Administered  . Influenza Whole 09/23/2007, 09/10/2008, 09/17/2009  . Pneumococcal Polysaccharide-23 04/07/2007    Screening Tests Health Maintenance  Topic Date Due  . DEXA SCAN  02/04/2008  . PNA vac Low Risk Adult (1 of 2 - PCV13) 04/06/2008  . COLONOSCOPY  12/07/2013  . ZOSTAVAX  08/07/2015 (Originally 02/03/2003)  . INFLUENZA VACCINE  07/08/2015  . TETANUS/TDAP  06/07/2021    All answers were reviewed with the patient and necessary referrals were made:  Leeanne Rio, PA-C   05/17/2015   History reviewed: allergies, current medications, past family history, past medical history, past social history, past surgical history and problem list  Review of Systems A comprehensive review of systems was negative except for: Behavioral/Psych: positive for aggressive behavior    Objective:     Vision by Snellen chart: right ZOX:WRUEAV to measure, left WUJ:[id number] to measure  Body mass index is 41.35 kg/(m^2). BP 142/67 mmHg  Pulse 50  Temp(Src) 98.5 F (36.9 C) (Oral)  Resp 14  Ht 5\' 2"  (1.575 m)  Wt 226 lb 2 oz (102.57 kg)  BMI 41.35 kg/m2  SpO2 100%  General appearance: alert, cooperative, appears stated age, no distress and slowed mentation Head: Normocephalic, without obvious abnormality, atraumatic Eyes: conjunctivae/corneas clear. PERRL, EOM's intact. Fundi benign. Ears: normal TM's and external ear canals both ears Nose: Nares normal. Septum midline. Mucosa normal. No drainage or sinus tenderness. Throat: lips, mucosa, and tongue normal; teeth and gums normal Lungs: clear to auscultation bilaterally Heart: regular rate and rhythm, S1, S2 normal, no murmur, click, rub or gallop Abdomen: soft, non-tender; bowel sounds normal; no  masses,  no organomegaly Skin: Skin color, texture, turgor normal. No rashes or lesions Lymph nodes: Cervical, supraclavicular, and axillary nodes normal. Neurologic: Mental status: alertness: alert, orientation: person, place Cranial nerves: normal     Assessment:     (1) Medicare Wellness,  Subsequent (2) Dementia      Plan:     (1) During the course of the visit the patient was educated and counseled about appropriate screening and preventive services including:    Diabetes screening  Nutrition counseling   Advanced directives: has an advanced directive - a copy HAS NOT been provided.   Prevnar given by nursing staff.  Diet review for nutrition referral? Yes ____  Not Indicated __x__  (2) Will check UA and Culture today to assess for UTI as cause of worsening symptoms.  Will also check BMP. Referral placed to Neurology for assessment.  Stop Prilosec.  Continue Namenda and Aricept.  Patient Instructions (the written plan) was given to the patient.  Medicare Attestation I have personally reviewed: The patient's medical and social history Their use of alcohol, tobacco or illicit drugs Their current medications and supplements The patient's functional ability including ADLs,fall risks, home safety risks, cognitive, and hearing and visual impairment Diet and physical activities Evidence for depression or mood disorders  The patient's weight, height, BMI, and visual acuity have been recorded in the chart.  I have made referrals, counseling, and provided education to the patient based on review of the above and I have provided the patient with a written personalized care plan for preventive services.     Raiford Noble [place], [place]   05/17/2015

## 2015-05-17 NOTE — Patient Instructions (Signed)
Please go to the lab for blood work. Continue medications as directed. You will be contacted by Neurology for an appointment.

## 2015-05-17 NOTE — Progress Notes (Signed)
Pre visit review using our clinic review tool, if applicable. No additional management support is needed unless otherwise documented below in the visit note/SLS  

## 2015-05-20 ENCOUNTER — Encounter: Payer: Self-pay | Admitting: Physician Assistant

## 2015-05-20 ENCOUNTER — Telehealth: Payer: Self-pay | Admitting: Physician Assistant

## 2015-05-20 LAB — URINE CULTURE

## 2015-05-20 MED ORDER — CIPROFLOXACIN HCL 250 MG PO TABS: 250.0000 mg | ORAL_TABLET | Freq: Two times a day (BID) | ORAL | Status: DC

## 2015-05-20 NOTE — Telephone Encounter (Signed)
Labs look great overall. Urine culture reveals UTI which can be causing some worsening of patient's memory.  Rx Cipro has been sent to pharmacy.  Take as directed.  Follow-up 1 week.

## 2015-05-20 NOTE — Telephone Encounter (Signed)
Patient Unavailable; daughter [HIPAA] informed, understood & agreed/SLS

## 2015-06-03 ENCOUNTER — Other Ambulatory Visit (INDEPENDENT_AMBULATORY_CARE_PROVIDER_SITE_OTHER): Payer: Medicare Other

## 2015-06-03 ENCOUNTER — Ambulatory Visit (INDEPENDENT_AMBULATORY_CARE_PROVIDER_SITE_OTHER): Payer: Medicare Other | Admitting: Internal Medicine

## 2015-06-03 ENCOUNTER — Encounter: Payer: Self-pay | Admitting: Internal Medicine

## 2015-06-03 VITALS — BP 120/70 | HR 64 | Temp 99.0°F | Resp 12 | Wt 227.0 lb

## 2015-06-03 DIAGNOSIS — N390 Urinary tract infection, site not specified: Secondary | ICD-10-CM

## 2015-06-03 DIAGNOSIS — R8281 Pyuria: Secondary | ICD-10-CM

## 2015-06-03 DIAGNOSIS — F0390 Unspecified dementia without behavioral disturbance: Secondary | ICD-10-CM | POA: Diagnosis not present

## 2015-06-03 LAB — URINALYSIS, ROUTINE W REFLEX MICROSCOPIC
Bilirubin Urine: NEGATIVE
Ketones, ur: NEGATIVE
NITRITE: NEGATIVE
Specific Gravity, Urine: 1.025 (ref 1.000–1.030)
Total Protein, Urine: NEGATIVE
Urine Glucose: NEGATIVE
Urobilinogen, UA: 0.2 (ref 0.0–1.0)
pH: 5.5 (ref 5.0–8.0)

## 2015-06-03 NOTE — Progress Notes (Signed)
Pre visit review using our clinic review tool, if applicable. No additional management support is needed unless otherwise documented below in the visit note. 

## 2015-06-03 NOTE — Assessment & Plan Note (Signed)
Checking U/A today although unclear if she was truly having symptoms as not much improved after appropriate treatment. No symptoms today.

## 2015-06-03 NOTE — Patient Instructions (Signed)
Stop taking the iron pills everyday. We have filled out the form for the daycare.   We will recheck the urine today and call you with the results.

## 2015-06-03 NOTE — Assessment & Plan Note (Signed)
Form filled out for daycare with caution of wandering given her advancing dementia. She has been on optimal therapy (aricept and namenda) for several years. She is still declining. Unclear if the pyuria was related.

## 2015-06-03 NOTE — Progress Notes (Signed)
   Subjective:    Patient ID: Diane Frey, female    DOB: [birth year], 74 y.o.   MRN: [id number]  HPI The patient is coming in today to follow up on a urine infection. She was seen about 2 weeks ago and took antibiotics for the infection. She did not have classic symptoms but had some memory worsening which was felt to be a symptom. Her daughter is with her and is not sure that the antibiotics helped anything at all. Her daughter would like for her to go to daycare for additional safety during the day. She does not wander due to her decreased mobility.   Review of Systems  Unable to perform ROS: Dementia  Constitutional: Negative for fever, activity change, appetite change and fatigue.  Respiratory: Negative for cough, chest tightness, shortness of breath and wheezing.   Cardiovascular: Negative for chest pain, palpitations and leg swelling.  Gastrointestinal: Negative for nausea, abdominal pain, diarrhea, constipation and abdominal distention.  Neurological:       Memory problems      Objective:   Physical Exam  Constitutional: She is oriented to person, place, and time. She appears well-developed and well-nourished.  HENT:  Head: Normocephalic and atraumatic.  Right Ear: External ear normal.  Left Ear: External ear normal.  Cardiovascular: Normal rate and regular rhythm.   Pulmonary/Chest: Effort normal and breath sounds normal. No respiratory distress. She has no wheezes. She has no rales.  Abdominal: Soft. She exhibits no distension. There is no tenderness. There is no rebound.  Musculoskeletal: She exhibits no edema.  Neurological: She is alert and oriented to person, place, and time. Coordination normal.  Skin: Skin is warm and dry.  Psychiatric: She has a normal mood and affect.   Filed Vitals:   06/03/15 1306  BP: 120/70  Pulse: 64  Temp: 99 F (37.2 C)  TempSrc: Oral  Resp: 12  Weight: 227 lb (102.967 kg)  SpO2: 96%      Assessment & Plan:

## 2015-06-05 ENCOUNTER — Other Ambulatory Visit: Payer: Self-pay | Admitting: Physician Assistant

## 2015-06-17 DIAGNOSIS — H4011X3 Primary open-angle glaucoma, severe stage: Secondary | ICD-10-CM | POA: Diagnosis not present

## 2015-06-19 ENCOUNTER — Ambulatory Visit: Payer: Medicare Other | Admitting: Neurology

## 2015-07-10 ENCOUNTER — Other Ambulatory Visit: Payer: Self-pay | Admitting: Physician Assistant

## 2015-07-18 DIAGNOSIS — H4011X3 Primary open-angle glaucoma, severe stage: Secondary | ICD-10-CM | POA: Diagnosis not present

## 2015-09-16 ENCOUNTER — Emergency Department (HOSPITAL_COMMUNITY): Payer: Medicare Other

## 2015-09-16 ENCOUNTER — Emergency Department (HOSPITAL_COMMUNITY)
Admission: EM | Admit: 2015-09-16 | Discharge: 2015-09-16 | Disposition: A | Discharge status: Home / Self Care | Payer: Medicare Other | Attending: Emergency Medicine | Admitting: Emergency Medicine

## 2015-09-16 ENCOUNTER — Encounter (HOSPITAL_COMMUNITY): Payer: Self-pay | Admitting: *Deleted

## 2015-09-16 DIAGNOSIS — R4182 Altered mental status, unspecified: Secondary | ICD-10-CM | POA: Insufficient documentation

## 2015-09-16 DIAGNOSIS — I10 Essential (primary) hypertension: Secondary | ICD-10-CM | POA: Diagnosis not present

## 2015-09-16 DIAGNOSIS — I6789 Other cerebrovascular disease: Secondary | ICD-10-CM | POA: Diagnosis not present

## 2015-09-16 DIAGNOSIS — Z8719 Personal history of other diseases of the digestive system: Secondary | ICD-10-CM | POA: Insufficient documentation

## 2015-09-16 DIAGNOSIS — M170 Bilateral primary osteoarthritis of knee: Secondary | ICD-10-CM | POA: Diagnosis not present

## 2015-09-16 DIAGNOSIS — H409 Unspecified glaucoma: Secondary | ICD-10-CM | POA: Diagnosis not present

## 2015-09-16 DIAGNOSIS — I2510 Atherosclerotic heart disease of native coronary artery without angina pectoris: Secondary | ICD-10-CM | POA: Diagnosis not present

## 2015-09-16 DIAGNOSIS — Z7982 Long term (current) use of aspirin: Secondary | ICD-10-CM | POA: Insufficient documentation

## 2015-09-16 DIAGNOSIS — E785 Hyperlipidemia, unspecified: Secondary | ICD-10-CM | POA: Diagnosis not present

## 2015-09-16 DIAGNOSIS — G309 Alzheimer's disease, unspecified: Secondary | ICD-10-CM | POA: Diagnosis not present

## 2015-09-16 DIAGNOSIS — Z79899 Other long term (current) drug therapy: Secondary | ICD-10-CM | POA: Diagnosis not present

## 2015-09-16 DIAGNOSIS — I252 Old myocardial infarction: Secondary | ICD-10-CM | POA: Diagnosis not present

## 2015-09-16 DIAGNOSIS — R001 Bradycardia, unspecified: Secondary | ICD-10-CM | POA: Diagnosis not present

## 2015-09-16 DIAGNOSIS — F0280 Dementia in other diseases classified elsewhere without behavioral disturbance: Secondary | ICD-10-CM | POA: Insufficient documentation

## 2015-09-16 DIAGNOSIS — I671 Cerebral aneurysm, nonruptured: Secondary | ICD-10-CM | POA: Diagnosis not present

## 2015-09-16 DIAGNOSIS — R531 Weakness: Secondary | ICD-10-CM | POA: Diagnosis present

## 2015-09-16 DIAGNOSIS — R4781 Slurred speech: Secondary | ICD-10-CM | POA: Diagnosis not present

## 2015-09-16 LAB — DIFFERENTIAL
BASOS PCT: 1 %
Basophils Absolute: 0.1 10*3/uL (ref 0.0–0.1)
EOS ABS: 0.3 10*3/uL (ref 0.0–0.7)
EOS PCT: 6 %
LYMPHS ABS: 1.5 10*3/uL (ref 0.7–4.0)
Lymphocytes Relative: 30 %
Monocytes Absolute: 0.5 10*3/uL (ref 0.1–1.0)
Monocytes Relative: 10 %
NEUTROS PCT: 53 %
Neutro Abs: 2.6 10*3/uL (ref 1.7–7.7)

## 2015-09-16 LAB — URINALYSIS, ROUTINE W REFLEX MICROSCOPIC
BILIRUBIN URINE: NEGATIVE
Glucose, UA: NEGATIVE mg/dL
Ketones, ur: NEGATIVE mg/dL
Leukocytes, UA: NEGATIVE
Nitrite: NEGATIVE
PROTEIN: NEGATIVE mg/dL
Specific Gravity, Urine: 1.008 (ref 1.005–1.030)
UROBILINOGEN UA: 0.2 mg/dL (ref 0.0–1.0)
pH: 5 (ref 5.0–8.0)

## 2015-09-16 LAB — CBC
HCT: 43.5 % (ref 36.0–46.0)
Hemoglobin: 14.2 g/dL (ref 12.0–15.0)
MCH: 30.2 pg (ref 26.0–34.0)
MCHC: 32.6 g/dL (ref 30.0–36.0)
MCV: 92.6 fL (ref 78.0–100.0)
PLATELETS: 160 10*3/uL (ref 150–400)
RBC: 4.7 MIL/uL (ref 3.87–5.11)
RDW: 13.4 % (ref 11.5–15.5)
WBC: 5 10*3/uL (ref 4.0–10.5)

## 2015-09-16 LAB — I-STAT CHEM 8, ED
BUN: 10 mg/dL (ref 6–20)
CALCIUM ION: 1.18 mmol/L (ref 1.13–1.30)
CHLORIDE: 102 mmol/L (ref 101–111)
Creatinine, Ser: 0.9 mg/dL (ref 0.44–1.00)
Glucose, Bld: 102 mg/dL — ABNORMAL HIGH (ref 65–99)
HCT: 46 % (ref 36.0–46.0)
Hemoglobin: 15.6 g/dL — ABNORMAL HIGH (ref 12.0–15.0)
Potassium: 4.6 mmol/L (ref 3.5–5.1)
SODIUM: 141 mmol/L (ref 135–145)
TCO2: 25 mmol/L (ref 0–100)

## 2015-09-16 LAB — COMPREHENSIVE METABOLIC PANEL
ALBUMIN: 3.5 g/dL (ref 3.5–5.0)
ALT: 13 U/L — ABNORMAL LOW (ref 14–54)
ANION GAP: 7 (ref 5–15)
AST: 22 U/L (ref 15–41)
Alkaline Phosphatase: 84 U/L (ref 38–126)
BILIRUBIN TOTAL: 0.9 mg/dL (ref 0.3–1.2)
BUN: 9 mg/dL (ref 6–20)
CHLORIDE: 107 mmol/L (ref 101–111)
CO2: 28 mmol/L (ref 22–32)
Calcium: 9.6 mg/dL (ref 8.9–10.3)
Creatinine, Ser: 0.97 mg/dL (ref 0.44–1.00)
GFR calc Af Amer: >60 mL/min (ref >60)
GFR calc non Af Amer: 57 mL/min — ABNORMAL LOW (ref >60)
GLUCOSE: 102 mg/dL — AB (ref 65–99)
POTASSIUM: 4.7 mmol/L (ref 3.5–5.1)
Sodium: 142 mmol/L (ref 135–145)
TOTAL PROTEIN: 6.7 g/dL (ref 6.5–8.1)

## 2015-09-16 LAB — PROTIME-INR
INR: 1.12 (ref 0.00–1.49)
PROTHROMBIN TIME: 14.6 s (ref 11.6–15.2)

## 2015-09-16 LAB — APTT: aPTT: 28 seconds (ref 24–37)

## 2015-09-16 LAB — I-STAT TROPONIN, ED
TROPONIN I, POC: 0.01 ng/mL (ref 0.00–0.08)
Troponin i, poc: 0 ng/mL (ref 0.00–0.08)

## 2015-09-16 LAB — URINE MICROSCOPIC-ADD ON

## 2015-09-16 LAB — CBG MONITORING, ED: GLUCOSE-CAPILLARY: 96 mg/dL (ref 65–99)

## 2015-09-16 NOTE — ED Notes (Signed)
Pt arrives from home via [facility]. Pt had a sudden onset of difficulty speaking with some slurred speech that lasted about 3-5 min. Upon EMS arrival symptoms had resolve. Pt has hx of stroke with left sided weakness and brain aneurysm.

## 2015-09-16 NOTE — ED Provider Notes (Signed)
CSN: [id number]     Arrival date & time 09/16/15  1245 History   First MD Initiated Contact with Patient 09/16/15 1301     Chief Complaint  Patient presents with  . Transient Ischemic Attack     (Consider location/radiation/quality/duration/timing/severity/associated sxs/prior Treatment) Patient is a 74 y.o. female presenting with general illness.  Illness Location:  Generalized Quality:  Staring spell Severity:  Moderate Onset quality:  Gradual Duration: 3-5 min. Timing:  Constant Progression:  Unchanged Chronicity:  New Context:  Spontaneous, ho prior CVA with reported L deficit Relieved by:  Nothing Worsened by:  Nothing Associated symptoms: no chest pain, no fever, no myalgias, no nausea and no vomiting     Past Medical History  Diagnosis Date  . CVA 02/10/2008  . ENDOCARDITIS 09/29/2010  . HYPERLIPIDEMIA 02/14/2007  . HYPERTENSION 02/14/2007  . MYOCARDIAL INFARCTION, HX OF 02/14/2007  . OBESITY 01/16/2009  . OBSTRUCTIVE SLEEP APNEA 05/27/2007  . 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 CORONARY ARTERY DISEASE 02/14/2007  . DEMENTIA 01/20/2010  . CAD (coronary artery disease)   . Myocardial infarct ([place])   . Edema   . Alzheimer disease   . GERD (gastroesophageal reflux disease)   . Glaucoma   . Gout   . Unspecified urinary incontinence   . Postmenopausal bleeding   . Morbid obesity ([place])   . Lack of coordination   . 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 Tubal ligation     Family History  Problem Relation Age of Onset  . Heart failure Mother     Deceased  . Kidney disease Brother     Deceased  . Heart disease Father     Deceased  . Cancer Mother   . Stroke Sister   . Hypertension Brother   . Hypertension Sister   . Heart disease Sister     Deceased  . Hypertension Sister     x2  . Hypertension Son     x2  . Glaucoma Son    Social History  Substance Use Topics  . Smoking status: Never Smoker   .  Smokeless tobacco: Never Used  . Alcohol Use: No   OB History    Gravida Para Term Preterm AB TAB SAB Ectopic Multiple Living   7 7 7       7      Review of Systems  Constitutional: Negative for fever.  Cardiovascular: Negative for chest pain.  Gastrointestinal: Negative for nausea and vomiting.  Musculoskeletal: Negative for myalgias.  All other systems reviewed and are negative.     Allergies  Review of patient's allergies indicates no known allergies.  Home Medications   Prior to Admission medications   Medication Sig Start Date End Date Taking? Authorizing Provider  amLODipine (NORVASC) 5 MG tablet Take 1 tablet (5 mg total) by mouth daily. 05/03/15   Brunetta Jeans, PA-C  aspirin ([facility] CH[facility] ASPIRIN) 81 MG chewable tablet TAKE 1 TABLET (81 MG TOTAL) BY MOUTH DAILY. 05/03/15   Brunetta Jeans, PA-C  atorvastatin (LIPITOR) 40 MG tablet Take 1 tablet (40 mg total) by mouth daily. 05/03/15   Brunetta Jeans, PA-C  Calcium Carbonate-Vit D-Min (CALTRATE 600+D PLUS) 600-400 MG-UNIT per tablet Take 1 tablet by mouth daily.      Historical Provider, MD  carvedilol (COREG) 25 MG tablet TAKE 1 TABLET (25 MG TOTAL) BY MOUTH 2 (TWO) TIMES DAILY WITH A MEAL. 05/03/15   Gwyndolyn Saxon  Daun Peacock, PA-C  [facility] ACETAMINOPHEN 325 MG tablet TAKE 2 TABLETS BY MOUTH TWICE A DAY 06/05/15   Brunetta Jeans, PA-C  donepezil (ARICEPT) 10 MG tablet Take 1 tablet (10 mg total) by mouth at bedtime. 05/03/15   Brunetta Jeans, PA-C  ferrous sulfate 325 (65 FE) MG tablet TAKE 1 TABLET (325 MG TOTAL) BY MOUTH ONCE DAILY. 05/03/15   Brunetta Jeans, PA-C  furosemide (LASIX) 40 MG tablet Take 1 tablet (40 mg total) by mouth daily. 05/03/15   Brunetta Jeans, PA-C  latanoprost (XALATAN) 0.005 % ophthalmic solution Place 1 drop into both eyes at bedtime.    Historical Provider, MD  lisinopril (PRINIVIL,ZESTRIL) 40 MG tablet Take 1 tablet (40 mg total) by mouth daily. 05/03/15   Brunetta Jeans, PA-C  memantine (NAMENDA  XR) 28 MG CP24 24 hr capsule TAKE 28 MG BY MOUTH AT BEDTIME. 05/03/15   Brunetta Jeans, PA-C  Multiple Vitamin (MULTIVITAMIN) capsule Take 1 capsule by mouth daily.      Historical Provider, MD  potassium chloride SA (KLOR-CON M20) 20 MEQ tablet Take 1 tablet (20 mEq total) by mouth daily. 05/03/15   Brunetta Jeans, PA-C  timolol (BETIMOL) 0.5 % ophthalmic solution Place 1 drop into both eyes 2 (two) times daily.    Historical Provider, MD   BP 100/48 mmHg  Pulse 45  Temp(Src) 98.1 F (36.7 C) (Oral)  Resp 14  Ht 5\' 2"  (1.575 m)  Wt 227 lb (102.967 kg)  BMI 41.51 kg/m2  SpO2 99% Physical Exam  Constitutional: She appears well-developed and well-nourished.  HENT:  Head: Normocephalic and atraumatic.  Right Ear: External ear normal.  Left Ear: External ear normal.  Eyes: Conjunctivae and EOM are normal. Pupils are equal, round, and reactive to light.  Neck: Normal range of motion. Neck supple.  Cardiovascular: Regular rhythm, normal heart sounds and intact distal pulses.  Bradycardia present.   Pulmonary/Chest: Effort normal and breath sounds normal.  Abdominal: Soft. Bowel sounds are normal. There is no tenderness.  Musculoskeletal: Normal range of motion.  Neurological: She is alert. She has normal strength and normal reflexes. No cranial nerve deficit or sensory deficit. Coordination normal.  Skin: Skin is warm and dry.  Vitals reviewed.   ED Course  Procedures (including critical care time) Labs Review Labs Reviewed  PROTIME-INR  APTT  CBC  DIFFERENTIAL  COMPREHENSIVE METABOLIC PANEL  I-STAT TROPOININ, ED  CBG MONITORING, ED  I-STAT CHEM 8, ED    Imaging Review No results found. I have personally reviewed and evaluated these images and lab results as part of my medical decision-making.   EKG Interpretation None      MDM   Final diagnoses:  None    74 y.o. female with pertinent PMH of prior CVA with reported L sided deficit, dementia, CAD presents with  staring spell without loss of postural spell earlier today.  This is apparently recurrent for the pt.  On arrival vitals and physical exam as above, benign, pt pleasant, cooperative, however intermittently very mildly confused.  Elba Barman initially as above.    Discussed situation at length with patient and family re: possibility of TIA or CAD, including death.  Offered admission for further wu, however as pt is on secondary CVA prevention and has an unclear story with a ho similar events, with shared decision making we agreed to dc home.    I have reviewed all laboratory and imaging studies if ordered as above  1.  Altered mental status         Debby Freiberg, MD 09/17/15 [phone]

## 2015-09-16 NOTE — ED Notes (Signed)
Patient transported to X-ray 

## 2015-09-16 NOTE — Discharge Instructions (Signed)
Confusion Confusion is the inability to think with your usual speed or clarity. Confusion may come on quickly or slowly over time. How quickly the confusion comes on depends on the cause. Confusion can be due to any number of causes. CAUSES   Concussion, head injury, or head trauma.  Seizures.  Stroke.  Fever.  Brain tumor.  Age related decreased brain function (dementia).  Heightened emotional states like rage or terror.  Mental illness in which the person loses the ability to determine what is real and what is not (hallucinations).  Infections such as a urinary tract infection (UTI).  Toxic effects from alcohol, drugs, or prescription medicines.  Dehydration and an imbalance of salts in the body (electrolytes).  Lack of sleep.  Low blood sugar (diabetes).  Low levels of oxygen from conditions such as chronic lung disorders.  Drug interactions or other medicine side effects.  Nutritional deficiencies, especially niacin, thiamine, vitamin C, or vitamin B.  Sudden drop in body temperature (hypothermia).  Change in routine, such as when traveling or hospitalized. SIGNS AND SYMPTOMS  People often describe their thinking as cloudy or unclear when they are confused. Confusion can also include feeling disoriented. That means you are unaware of where or who you are. You may also not know what the date or time is. If confused, you may also have difficulty paying attention, remembering, and making decisions. Some people also act aggressively when they are confused.  DIAGNOSIS  The medical evaluation of confusion may include:  Blood and urine tests.  X-rays.  Brain and nervous system tests.  Analyzing your brain waves (electroencephalogram or EEG).  Magnetic resonance imaging (MRI) of your head.  Computed tomography (CT) scan of your head.  Mental status tests in which your health care provider may ask many questions. Some of these questions may seem silly or strange,  but they are a very important test to help diagnose and treat confusion. TREATMENT  An admission to the hospital may not be needed, but a person with confusion should not be left alone. Stay with a family member or friend until the confusion clears. Avoid alcohol, pain relievers, or sedative drugs until you have fully recovered. Do not drive until directed by your health care provider. HOME CARE INSTRUCTIONS  What family and friends can do:  To find out if someone is confused, ask the person to state his or her name, age, and the date. If the person is unsure or answers incorrectly, he or she is confused.  Always introduce yourself, no matter how well the person knows you.  Often remind the person of his or her location.  Place a calendar and clock near the confused person.  Help the person with his or her medicines. You may want to use a pill box, an alarm as a reminder, or give the person each dose as prescribed.  Talk about current events and plans for the day.  Try to keep the environment calm, quiet, and peaceful.  Make sure the person keeps follow-up visits with his or her health care provider. PREVENTION  Ways to prevent confusion:  Avoid alcohol.  Eat a balanced diet.  Get enough sleep.  Take medicine only as directed by your health care provider.  Do not become isolated. Spend time with other people and make plans for your days.  Keep careful watch on your blood sugar levels if you are diabetic. SEEK IMMEDIATE MEDICAL CARE IF:   You develop severe headaches, repeated vomiting, seizures, blackouts, or   slurred speech.  There is increasing confusion, weakness, numbness, restlessness, or personality changes.  You develop a loss of balance, have marked dizziness, feel uncoordinated, or fall.  You have delusions, hallucinations, or develop severe anxiety.  Your family members think you need to be rechecked.   This information is not intended to replace advice given  to you by your health care provider. Make sure you discuss any questions you have with your health care provider.   Document Released: 12/31/2004 Document Revised: 12/14/2014 Document Reviewed: 12/29/2013 Elsevier Interactive Patient Education 2016 Elsevier Inc.  

## 2015-10-08 ENCOUNTER — Other Ambulatory Visit: Payer: Self-pay | Admitting: Physician Assistant

## 2015-11-15 ENCOUNTER — Encounter: Payer: Self-pay | Admitting: Internal Medicine

## 2015-11-22 ENCOUNTER — Ambulatory Visit: Payer: Medicare Other | Admitting: Physician Assistant

## 2015-11-22 ENCOUNTER — Ambulatory Visit: Payer: Medicare Other | Admitting: Internal Medicine

## 2015-11-24 ENCOUNTER — Other Ambulatory Visit: Payer: Self-pay | Admitting: Physician Assistant

## 2015-11-28 ENCOUNTER — Telehealth: Payer: Self-pay | Admitting: Internal Medicine

## 2015-11-28 NOTE — Telephone Encounter (Signed)
Pt request refill for furosemide, atorvastatin, amLODipine and lisinopril [facility]

## 2015-11-28 NOTE — Telephone Encounter (Signed)
States patient is out of

## 2015-11-29 ENCOUNTER — Other Ambulatory Visit: Payer: Self-pay | Admitting: Geriatric Medicine

## 2015-11-29 MED ORDER — LISINOPRIL 40 MG PO TABS: 40.0000 mg | ORAL_TABLET | Freq: Every day | ORAL | Status: DC

## 2015-11-29 MED ORDER — ATORVASTATIN CALCIUM 40 MG PO TABS: 40.0000 mg | ORAL_TABLET | Freq: Every day | ORAL | Status: DC

## 2015-11-29 MED ORDER — AMLODIPINE BESYLATE 5 MG PO TABS: 5.0000 mg | ORAL_TABLET | Freq: Every day | ORAL | Status: DC

## 2015-11-29 MED ORDER — FUROSEMIDE 40 MG PO TABS: 40.0000 mg | ORAL_TABLET | Freq: Every day | ORAL | Status: DC

## 2015-11-29 NOTE — Telephone Encounter (Signed)
Sent all medications to pharmacy.

## 2015-12-21 ENCOUNTER — Other Ambulatory Visit: Payer: Self-pay | Admitting: Internal Medicine

## 2015-12-21 ENCOUNTER — Other Ambulatory Visit: Payer: Self-pay | Admitting: Physician Assistant

## 2016-01-05 ENCOUNTER — Other Ambulatory Visit: Payer: Self-pay | Admitting: Internal Medicine

## 2016-01-06 ENCOUNTER — Ambulatory Visit (INDEPENDENT_AMBULATORY_CARE_PROVIDER_SITE_OTHER): Payer: Medicare Other | Admitting: Internal Medicine

## 2016-01-06 ENCOUNTER — Encounter: Payer: Self-pay | Admitting: Internal Medicine

## 2016-01-06 ENCOUNTER — Telehealth: Payer: Self-pay | Admitting: Internal Medicine

## 2016-01-06 VITALS — BP 120/70 | HR 50 | Temp 98.4°F | Ht 62.0 in | Wt 221.0 lb

## 2016-01-06 DIAGNOSIS — N950 Postmenopausal bleeding: Secondary | ICD-10-CM | POA: Diagnosis not present

## 2016-01-06 DIAGNOSIS — R51 Headache: Secondary | ICD-10-CM

## 2016-01-06 DIAGNOSIS — G4733 Obstructive sleep apnea (adult) (pediatric): Secondary | ICD-10-CM

## 2016-01-06 DIAGNOSIS — R519 Headache, unspecified: Secondary | ICD-10-CM

## 2016-01-06 MED ORDER — MEMANTINE HCL ER 28 MG PO CP24: 28.0000 mg | ORAL_CAPSULE | Freq: Every day | ORAL | Status: DC

## 2016-01-06 NOTE — Telephone Encounter (Signed)
Attempted to start PA, no insurance information on file. Called pt, work number disconnected, home number "busy" signal.

## 2016-01-06 NOTE — Assessment & Plan Note (Signed)
Is not using her CPAP at this time and could be related to her increase in headaches. If they worsen she will start using cpap again to see if this helps with the headaches.

## 2016-01-06 NOTE — Assessment & Plan Note (Signed)
Per reviews of charts from our records and from [facility] she has had post-menopausal bleeding with suspicion for seroma of the uterus in the past. Given her advancing dementia it is unclear if further workup is in her best interest. They do not wish to pursue at this time.

## 2016-01-06 NOTE — Assessment & Plan Note (Signed)
BP is controlled on her amlodipine and coreg for now. She does have history of aneurysm which were partially treated in the past. She did have CT brain in October 2016 which was not changed significantly from prior. It does not appear that she has followed up on that for some time. Will watch for worsening and if worsening we will consider MRI brain to look at vasculature.

## 2016-01-06 NOTE — Progress Notes (Signed)
Pre visit review using our clinic review tool, if applicable. No additional management support is needed unless otherwise documented below in the visit note. 

## 2016-01-06 NOTE — Telephone Encounter (Signed)
Patients daughter Lenna Sciara called to advise that memantine (NAMENDA XR) 28 MG CP24 24 hr capsule [id number] needs PA with [employer].

## 2016-01-06 NOTE — Progress Notes (Signed)
   Subjective:    Patient ID: Diane Frey, female    DOB: [birth year], 75 y.o.   MRN: [id number]  HPI The patient is a 75 YO female coming in for headaches. She has had headaches off an on during her life. She is getting 2 headaches per week. Able to take tylenol and they ease off. Resting for about 20 minutes and they go away. She has stopped using her CPAP recently due to it being uncomfortable. Denies weakness, numbness, confusion. Memory is stable from last visit although she does still have severe memory problems. She is also having some spotting again which had been stopped for some time. Has also had problems with this in the past. Is not in daycare at this time but they are looking for another one for her. She did start after our last visit.   Review of Systems  Unable to perform ROS: Dementia  Constitutional: Negative for fever, activity change, appetite change and fatigue.  Respiratory: Negative for cough, chest tightness, shortness of breath and wheezing.   Cardiovascular: Negative for chest pain, palpitations and leg swelling.  Gastrointestinal: Negative for nausea, abdominal pain, diarrhea, constipation and abdominal distention.  Neurological: Positive for headaches. Negative for dizziness, weakness and numbness.       Memory problems  Psychiatric/Behavioral: Negative for behavioral problems and agitation.      Objective:   Physical Exam  Constitutional: She appears well-developed and well-nourished.  HENT:  Head: Normocephalic and atraumatic.  Right Ear: External ear normal.  Left Ear: External ear normal.  Eyes: EOM are normal. Pupils are equal, round, and reactive to light.  Neck: No JVD present.  Cardiovascular: Normal rate and regular rhythm.   Pulmonary/Chest: Effort normal and breath sounds normal. No respiratory distress. She has no wheezes. She has no rales.  Abdominal: Soft. Bowel sounds are normal. She exhibits no distension. There is no tenderness. There is  no rebound.  Musculoskeletal: She exhibits no edema.  Neurological: She is alert. Coordination normal.  Skin: Skin is warm and dry.  Psychiatric: She has a normal mood and affect.   Filed Vitals:   01/06/16 0858  BP: 120/70  Pulse: 50  Temp: 98.4 F (36.9 C)  TempSrc: Oral  Height: 5\' 2"  (1.575 m)  Weight: 221 lb (100.245 kg)  SpO2: 95%      Assessment & Plan:

## 2016-01-06 NOTE — Patient Instructions (Signed)
We will not check a scan of the head today since the one in October was the same.   It is okay to use tylenol for the headaches as needed.   If they get worse or more often I recommend trying the CPAP back again for a couple of nights to see if the headaches go away. If they do you can decide if you want to use CPAP or have the headaches.

## 2016-01-08 NOTE — Telephone Encounter (Signed)
Several attempted call backs, pt's number is a "busy" signal. Closing phone note until further contact from the pt.

## 2016-01-10 ENCOUNTER — Other Ambulatory Visit: Payer: Self-pay

## 2016-01-10 MED ORDER — CARVEDILOL 25 MG PO TABS: ORAL_TABLET | ORAL | Status: DC

## 2016-01-10 MED ORDER — LISINOPRIL 40 MG PO TABS: 40.0000 mg | ORAL_TABLET | Freq: Every day | ORAL | Status: DC

## 2016-01-10 MED ORDER — AMLODIPINE BESYLATE 5 MG PO TABS: 5.0000 mg | ORAL_TABLET | Freq: Every day | ORAL | Status: DC

## 2016-01-10 MED ORDER — POTASSIUM CHLORIDE CRYS ER 20 MEQ PO TBCR: 20.0000 meq | EXTENDED_RELEASE_TABLET | Freq: Every day | ORAL | Status: DC

## 2016-01-10 MED ORDER — FUROSEMIDE 40 MG PO TABS: 40.0000 mg | ORAL_TABLET | Freq: Every day | ORAL | Status: DC

## 2016-01-10 MED ORDER — ATORVASTATIN CALCIUM 40 MG PO TABS: 40.0000 mg | ORAL_TABLET | Freq: Every day | ORAL | Status: DC

## 2016-01-10 MED ORDER — MEMANTINE HCL ER 28 MG PO CP24: 28.0000 mg | ORAL_CAPSULE | Freq: Every day | ORAL | Status: DC

## 2016-01-10 MED ORDER — DONEPEZIL HCL 10 MG PO TABS: 10.0000 mg | ORAL_TABLET | Freq: Every day | ORAL | Status: DC

## 2016-01-10 NOTE — Telephone Encounter (Signed)
Pt's daughter called requesting refills to new pharmacy - [facility]. Refills sent, daughter aware

## 2016-01-15 ENCOUNTER — Ambulatory Visit (INDEPENDENT_AMBULATORY_CARE_PROVIDER_SITE_OTHER): Payer: Medicare Other | Admitting: Internal Medicine

## 2016-01-15 ENCOUNTER — Encounter: Payer: Self-pay | Admitting: Internal Medicine

## 2016-01-15 ENCOUNTER — Telehealth: Payer: Self-pay | Admitting: Internal Medicine

## 2016-01-15 VITALS — BP 122/60 | HR 67 | Temp 99.0°F | Resp 14 | Ht 62.0 in | Wt 218.0 lb

## 2016-01-15 DIAGNOSIS — S161XXA Strain of muscle, fascia and tendon at neck level, initial encounter: Secondary | ICD-10-CM

## 2016-01-15 DIAGNOSIS — M542 Cervicalgia: Secondary | ICD-10-CM | POA: Diagnosis not present

## 2016-01-15 MED ORDER — TIZANIDINE HCL 2 MG PO TABS: 2.0000 mg | ORAL_TABLET | Freq: Four times a day (QID) | ORAL | Status: DC | PRN

## 2016-01-15 MED ORDER — KETOROLAC TROMETHAMINE 30 MG/ML IJ SOLN
30.0000 mg | Freq: Once | INTRAMUSCULAR | Status: AC
  Administered 2016-01-15: 30 mg via INTRAMUSCULAR

## 2016-01-15 MED ORDER — MEMANTINE HCL ER 28 MG PO CP24
28.0000 mg | ORAL_CAPSULE | Freq: Every day | ORAL | Status: DC
Start: 2016-01-15 — End: 2016-03-30

## 2016-01-15 NOTE — Patient Instructions (Signed)
We have given you a shot of pain medicine here today which should help temporarily. We have sent in a muscle relaxer that you can take when you need it up to 3 times per day.   This pain should go away in a couple of weeks so it should not be permanent.   We have sent in the generic of the namenda and it will still likely need a prior authorization so try to fill the medicine to start that paperwork and we will get it approved.

## 2016-01-15 NOTE — Telephone Encounter (Signed)
Pt called in stating memantine (NAMENDA XR) 28 MG CP24 24 hr capsule [id number] is too expensive and wondering if there is an alternative Pharmacy is Paediatric nurse on [address]

## 2016-01-15 NOTE — Progress Notes (Signed)
Pre visit review using our clinic review tool, if applicable. No additional management support is needed unless otherwise documented below in the visit note. 

## 2016-01-16 NOTE — Telephone Encounter (Signed)
No answer to reach patient.

## 2016-01-16 NOTE — Telephone Encounter (Signed)
We just talked to her about this yesterday. Likely it needs a PA, did they ask the pharmacist?

## 2016-01-17 DIAGNOSIS — S161XXA Strain of muscle, fascia and tendon at neck level, initial encounter: Secondary | ICD-10-CM | POA: Insufficient documentation

## 2016-01-17 NOTE — Assessment & Plan Note (Signed)
IM toradol given at visit and rx for tizanidine. She can continue to use heat and tylenol as well.

## 2016-01-17 NOTE — Progress Notes (Signed)
   Subjective:    Patient ID: Diane Frey, female    DOB: Jan 11, 1941, 75 y.o.   MRN: [id number]  HPI The patient is a 75 YO female coming in for neck spasm and pain. She thinks that she slept on it wrong. Started about 1 week ago. Heat helps and she has been using tylenol with some relief. 7/10 at worst. Overall stable. No numbness or weakness in her hands or arms. No shoulder pain or headaches.   Review of Systems  Unable to perform ROS: Dementia  Constitutional: Negative for fever, activity change, appetite change and fatigue.  Respiratory: Negative for cough, chest tightness, shortness of breath and wheezing.   Cardiovascular: Negative for chest pain, palpitations and leg swelling.  Gastrointestinal: Negative for nausea, abdominal pain, diarrhea, constipation and abdominal distention.  Musculoskeletal: Positive for neck pain. Negative for back pain, arthralgias, gait problem and neck stiffness.  Neurological: Negative for dizziness, weakness, numbness and headaches.       Memory problems  Psychiatric/Behavioral: Negative for behavioral problems and agitation.      Objective:   Physical Exam  Constitutional: She appears well-developed and well-nourished.  HENT:  Head: Normocephalic and atraumatic.  Right Ear: External ear normal.  Left Ear: External ear normal.  Eyes: EOM are normal. Pupils are equal, round, and reactive to light.  Neck: No JVD present.  Cardiovascular: Normal rate and regular rhythm.   Pulmonary/Chest: Effort normal and breath sounds normal. No respiratory distress. She has no wheezes. She has no rales.  Abdominal: Soft. Bowel sounds are normal. She exhibits no distension. There is no tenderness. There is no rebound.  Musculoskeletal: She exhibits tenderness. She exhibits no edema.  Some tenderness to touch bilaterally in the neck muscles.   Neurological: She is alert. Coordination normal.  Skin: Skin is warm and dry.  Psychiatric: She has a normal mood  and affect.   Filed Vitals:   01/15/16 1019  BP: 122/60  Pulse: 67  Temp: 99 F (37.2 C)  TempSrc: Oral  Resp: 14  Height: 5\' 2"  (1.575 m)  Weight: 218 lb (98.884 kg)  SpO2: 96%      Assessment & Plan:  IM toradol 30 mg given at visit.

## 2016-01-23 NOTE — Telephone Encounter (Signed)
Left msg on triage requesting call back from nurse concerning msg below...Johny Chess

## 2016-01-30 NOTE — Telephone Encounter (Signed)
In the computer it does not appear that the short acting memantine is cheaper. Can she call her insurance to find out if the short acting memantine is cheaper? This may be part of their deductible for the year.

## 2016-01-30 NOTE — Telephone Encounter (Signed)
[name] is calling to follow up on this issue. i clarified the below...   She states that multiple pharmacies never advised of a PA being needed, but rather only a large amount like $300 being due.  She is asking if there is an alternative that can be called in.   Best ph # is [phone]

## 2016-01-31 NOTE — Telephone Encounter (Signed)
Left message for patient to call back  

## 2016-02-10 NOTE — Telephone Encounter (Signed)
Patient will check with her insurance company and get back with [place].

## 2016-02-10 NOTE — Telephone Encounter (Signed)
Can you please give Lenna Sciara a call back asap at [phone]

## 2016-03-23 ENCOUNTER — Telehealth: Payer: Self-pay | Admitting: Internal Medicine

## 2016-03-23 NOTE — Telephone Encounter (Signed)
Pt's daughter Lenna Sciara is needing to talk with you regarding putting pt in a home.  She can be reached at [phone]

## 2016-03-24 NOTE — Telephone Encounter (Signed)
Daughter will bring the patient in for an office visit and Dr. Sharlet Salina will fill out an FL2 form.

## 2016-03-30 ENCOUNTER — Emergency Department (HOSPITAL_COMMUNITY): Payer: Medicare Other

## 2016-03-30 ENCOUNTER — Inpatient Hospital Stay (HOSPITAL_COMMUNITY)
Admission: EM | Admit: 2016-03-30 | Discharge: 2016-04-03 | DRG: 072 | Disposition: A | Discharge status: Skilled Nursing Facility | Payer: Medicare Other | Attending: Internal Medicine | Admitting: Internal Medicine

## 2016-03-30 DIAGNOSIS — F0390 Unspecified dementia without behavioral disturbance: Secondary | ICD-10-CM

## 2016-03-30 DIAGNOSIS — E876 Hypokalemia: Secondary | ICD-10-CM | POA: Diagnosis not present

## 2016-03-30 DIAGNOSIS — G9340 Encephalopathy, unspecified: Secondary | ICD-10-CM | POA: Diagnosis not present

## 2016-03-30 DIAGNOSIS — I2510 Atherosclerotic heart disease of native coronary artery without angina pectoris: Secondary | ICD-10-CM | POA: Diagnosis present

## 2016-03-30 DIAGNOSIS — H409 Unspecified glaucoma: Secondary | ICD-10-CM | POA: Diagnosis present

## 2016-03-30 DIAGNOSIS — R569 Unspecified convulsions: Secondary | ICD-10-CM | POA: Diagnosis not present

## 2016-03-30 DIAGNOSIS — G309 Alzheimer's disease, unspecified: Secondary | ICD-10-CM | POA: Diagnosis present

## 2016-03-30 DIAGNOSIS — Z7982 Long term (current) use of aspirin: Secondary | ICD-10-CM

## 2016-03-30 DIAGNOSIS — G9341 Metabolic encephalopathy: Secondary | ICD-10-CM | POA: Diagnosis not present

## 2016-03-30 DIAGNOSIS — R4182 Altered mental status, unspecified: Secondary | ICD-10-CM | POA: Diagnosis not present

## 2016-03-30 DIAGNOSIS — E785 Hyperlipidemia, unspecified: Secondary | ICD-10-CM | POA: Diagnosis present

## 2016-03-30 DIAGNOSIS — F0280 Dementia in other diseases classified elsewhere without behavioral disturbance: Secondary | ICD-10-CM | POA: Diagnosis present

## 2016-03-30 DIAGNOSIS — R4701 Aphasia: Secondary | ICD-10-CM | POA: Diagnosis not present

## 2016-03-30 DIAGNOSIS — R319 Hematuria, unspecified: Secondary | ICD-10-CM | POA: Diagnosis present

## 2016-03-30 DIAGNOSIS — G4733 Obstructive sleep apnea (adult) (pediatric): Secondary | ICD-10-CM | POA: Diagnosis not present

## 2016-03-30 DIAGNOSIS — G9389 Other specified disorders of brain: Secondary | ICD-10-CM | POA: Diagnosis present

## 2016-03-30 DIAGNOSIS — Z79899 Other long term (current) drug therapy: Secondary | ICD-10-CM

## 2016-03-30 DIAGNOSIS — E669 Obesity, unspecified: Secondary | ICD-10-CM | POA: Diagnosis present

## 2016-03-30 DIAGNOSIS — I252 Old myocardial infarction: Secondary | ICD-10-CM

## 2016-03-30 DIAGNOSIS — I10 Essential (primary) hypertension: Secondary | ICD-10-CM | POA: Diagnosis present

## 2016-03-30 DIAGNOSIS — R402431 Glasgow coma scale score 3-8, in the field [EMT or ambulance]: Secondary | ICD-10-CM | POA: Diagnosis not present

## 2016-03-30 DIAGNOSIS — M109 Gout, unspecified: Secondary | ICD-10-CM | POA: Diagnosis present

## 2016-03-30 DIAGNOSIS — K219 Gastro-esophageal reflux disease without esophagitis: Secondary | ICD-10-CM | POA: Diagnosis present

## 2016-03-30 DIAGNOSIS — Z6838 Body mass index (BMI) 38.0-38.9, adult: Secondary | ICD-10-CM

## 2016-03-30 DIAGNOSIS — Z8673 Personal history of transient ischemic attack (TIA), and cerebral infarction without residual deficits: Secondary | ICD-10-CM

## 2016-03-30 LAB — I-STAT CHEM 8, ED
BUN: 5 mg/dL — AB (ref 6–20)
CALCIUM ION: 1.01 mmol/L — AB (ref 1.13–1.30)
CHLORIDE: 102 mmol/L (ref 101–111)
CREATININE: 0.7 mg/dL (ref 0.44–1.00)
GLUCOSE: 121 mg/dL — AB (ref 65–99)
HCT: 41 % (ref 36.0–46.0)
Hemoglobin: 13.9 g/dL (ref 12.0–15.0)
POTASSIUM: 3 mmol/L — AB (ref 3.5–5.1)
Sodium: 140 mmol/L (ref 135–145)
TCO2: 22 mmol/L (ref 0–100)

## 2016-03-30 LAB — RAPID URINE DRUG SCREEN, HOSP PERFORMED
AMPHETAMINES: NOT DETECTED
BARBITURATES: NOT DETECTED
BENZODIAZEPINES: NOT DETECTED
Cocaine: NOT DETECTED
Opiates: NOT DETECTED
Tetrahydrocannabinol: NOT DETECTED

## 2016-03-30 LAB — PROTIME-INR
INR: 1.16 (ref 0.00–1.49)
Prothrombin Time: 15 seconds (ref 11.6–15.2)

## 2016-03-30 LAB — DIFFERENTIAL
BASOS ABS: 0 10*3/uL (ref 0.0–0.1)
BASOS PCT: 1 %
Eosinophils Absolute: 0.1 10*3/uL (ref 0.0–0.7)
Eosinophils Relative: 4 %
LYMPHS PCT: 37 %
Lymphs Abs: 1.5 10*3/uL (ref 0.7–4.0)
MONO ABS: 0.3 10*3/uL (ref 0.1–1.0)
MONOS PCT: 7 %
NEUTROS ABS: 2 10*3/uL (ref 1.7–7.7)
Neutrophils Relative %: 51 %

## 2016-03-30 LAB — CBC
HEMATOCRIT: 41.2 % (ref 36.0–46.0)
Hemoglobin: 13.6 g/dL (ref 12.0–15.0)
MCH: 29.6 pg (ref 26.0–34.0)
MCHC: 33 g/dL (ref 30.0–36.0)
MCV: 89.8 fL (ref 78.0–100.0)
Platelets: 150 10*3/uL (ref 150–400)
RBC: 4.59 MIL/uL (ref 3.87–5.11)
RDW: 13.4 % (ref 11.5–15.5)
WBC: 4 10*3/uL (ref 4.0–10.5)

## 2016-03-30 LAB — COMPREHENSIVE METABOLIC PANEL
ALK PHOS: 63 U/L (ref 38–126)
ALT: 13 U/L — AB (ref 14–54)
AST: 21 U/L (ref 15–41)
Albumin: 3.5 g/dL (ref 3.5–5.0)
Anion gap: 14 (ref 5–15)
BUN: 6 mg/dL (ref 6–20)
CALCIUM: 9.1 mg/dL (ref 8.9–10.3)
CHLORIDE: 104 mmol/L (ref 101–111)
CO2: 24 mmol/L (ref 22–32)
Creatinine, Ser: 0.95 mg/dL (ref 0.44–1.00)
GFR, EST NON AFRICAN AMERICAN: 58 mL/min — AB (ref >60)
Glucose, Bld: 124 mg/dL — ABNORMAL HIGH (ref 65–99)
Potassium: 2.9 mmol/L — ABNORMAL LOW (ref 3.5–5.1)
SODIUM: 142 mmol/L (ref 135–145)
Total Bilirubin: 0.8 mg/dL (ref 0.3–1.2)
Total Protein: 6.5 g/dL (ref 6.5–8.1)

## 2016-03-30 LAB — URINE MICROSCOPIC-ADD ON

## 2016-03-30 LAB — CBG MONITORING, ED: GLUCOSE-CAPILLARY: 99 mg/dL (ref 65–99)

## 2016-03-30 LAB — URINALYSIS, ROUTINE W REFLEX MICROSCOPIC
BILIRUBIN URINE: NEGATIVE
Glucose, UA: NEGATIVE mg/dL
KETONES UR: NEGATIVE mg/dL
Leukocytes, UA: NEGATIVE
NITRITE: NEGATIVE
PROTEIN: NEGATIVE mg/dL
Specific Gravity, Urine: 1.006 (ref 1.005–1.030)
pH: 7 (ref 5.0–8.0)

## 2016-03-30 LAB — TSH: TSH: 3.033 u[IU]/mL (ref 0.350–4.500)

## 2016-03-30 LAB — MAGNESIUM: Magnesium: 1.7 mg/dL (ref 1.7–2.4)

## 2016-03-30 LAB — I-STAT TROPONIN, ED: Troponin i, poc: 0.01 ng/mL (ref 0.00–0.08)

## 2016-03-30 LAB — APTT: APTT: 29 s (ref 24–37)

## 2016-03-30 LAB — ETHANOL: Alcohol, Ethyl (B): <5 mg/dL (ref <5)

## 2016-03-30 MED ORDER — ENOXAPARIN SODIUM 40 MG/0.4ML ~~LOC~~ SOLN
40.0000 mg | SUBCUTANEOUS | Status: DC
  Administered 2016-03-30 – 2016-04-03 (×5): 40 mg via SUBCUTANEOUS
  Filled 2016-03-30 (×5): qty 0.4

## 2016-03-30 MED ORDER — ONDANSETRON HCL 4 MG/2ML IJ SOLN: 4.0000 mg | Freq: Four times a day (QID) | INTRAMUSCULAR | Status: DC | PRN

## 2016-03-30 MED ORDER — ACETAMINOPHEN 650 MG RE SUPP: 650.0000 mg | Freq: Four times a day (QID) | RECTAL | Status: DC | PRN

## 2016-03-30 MED ORDER — ONDANSETRON HCL 4 MG PO TABS: 4.0000 mg | ORAL_TABLET | Freq: Four times a day (QID) | ORAL | Status: DC | PRN

## 2016-03-30 MED ORDER — SODIUM CHLORIDE 0.9 % IV SOLN
500.0000 mg | Freq: Two times a day (BID) | INTRAVENOUS | Status: DC
  Administered 2016-03-30 – 2016-04-03 (×8): 500 mg via INTRAVENOUS
  Filled 2016-03-30 (×11): qty 5

## 2016-03-30 MED ORDER — LISINOPRIL 20 MG PO TABS
40.0000 mg | ORAL_TABLET | Freq: Every day | ORAL | Status: DC
  Administered 2016-03-31: 40 mg via ORAL
  Filled 2016-03-30: qty 2

## 2016-03-30 MED ORDER — MORPHINE SULFATE (PF) 2 MG/ML IV SOLN: 1.0000 mg | INTRAVENOUS | Status: DC | PRN

## 2016-03-30 MED ORDER — SODIUM CHLORIDE 0.9% FLUSH
3.0000 mL | Freq: Two times a day (BID) | INTRAVENOUS | Status: DC
  Administered 2016-03-30 – 2016-04-01 (×5): 3 mL via INTRAVENOUS

## 2016-03-30 MED ORDER — LATANOPROST 0.005 % OP SOLN
1.0000 [drp] | Freq: Every day | OPHTHALMIC | Status: DC
  Administered 2016-03-30 – 2016-04-02 (×4): 1 [drp] via OPHTHALMIC
  Filled 2016-03-30: qty 2.5

## 2016-03-30 MED ORDER — SODIUM CHLORIDE 0.9 % IV SOLN: 1000.0000 mg | INTRAVENOUS | Status: AC

## 2016-03-30 MED ORDER — POTASSIUM CHLORIDE IN NACL 20-0.9 MEQ/L-% IV SOLN
INTRAVENOUS | Status: DC
  Administered 2016-03-30 – 2016-04-01 (×3): via INTRAVENOUS
  Filled 2016-03-30 (×4): qty 1000

## 2016-03-30 MED ORDER — DORZOLAMIDE HCL-TIMOLOL MAL 2-0.5 % OP SOLN
1.0000 [drp] | Freq: Two times a day (BID) | OPHTHALMIC | Status: DC
  Administered 2016-03-30 – 2016-04-03 (×8): 1 [drp] via OPHTHALMIC
  Filled 2016-03-30: qty 10

## 2016-03-30 MED ORDER — ACETAMINOPHEN 325 MG PO TABS: 650.0000 mg | ORAL_TABLET | Freq: Four times a day (QID) | ORAL | Status: DC | PRN

## 2016-03-30 NOTE — ED Notes (Signed)
Pt. Coming from home via [facility] after episode of altered level of consciousness. Pt. Hx of alzheimers and requires daughter for ADL's, but can hold intelligible conversation. Pt. Noted to have a decreased level of consciousness as she sat forward and stared off to the right. Pt. Noted to left-sided facial droop, which has resolved. Pt. Nonverbal upon arrival. Stroke team at bedside.

## 2016-03-30 NOTE — ED Notes (Signed)
Pt CBG, 99. Nurse was notified.

## 2016-03-30 NOTE — Code Documentation (Signed)
75yo female arriving to [facility] via [place] at 1217.  Patient with h/o dementia and stroke needing assistance for ADLs but able to communicate at baseline.  EMS was called d/t patient slumping over while eating and caregiver noted patient to be starring then have bilateral shaking followed by altered mental status.  EMS assessed left facial droop on arrival and activated a code stroke.  Facial droop improved per EMS on arrival to the ED.  Stroke team at the bedside on patient arrival. Labs drawn and patient to CT.  Dr. Leonel Ramsay to the bedside.  CT completed.  NIHSS 17, see documentation for details and code stroke times.  Patient with global aphasia and unable to follow commands.  Patient moving all extremities and no facial droop appreciated on exam.  Caregiver at the bedside.  Code stroke canceled per Dr. Leonel Ramsay.  Bedside handoff with ED RN Junie Panning.

## 2016-03-30 NOTE — H&P (Signed)
[facility] History and Physical  Diane Frey [id number] DOB: [birth year] DOA: 03/30/2016  Referring physician:  PCP: Hoyt Koch, MD   Chief Complaint: acute encephalopathy  HPI: Diane Frey is a 75 y.o. female with a past medical history includes Alzheimer's, CAD, struck sleep apnea, hypertension presents to emergency Department chief complaint of acute cephalopathy. Initial evaluation concerning for seizure.  Information is obtained from the family and the chart. Daughter his primary caregivers at the bedside. Daughter reports she was about to feed patient  and noticed patient turned her head to the right and To therapy. She became nontoxic and of and did not respond to questions are simple commands. She then suddenly had bilateral arm and leg jerking. Daughter reports this lasted about 5 minutes. After that she was very drowsy. EMS was called and patient was transported to the emergency department. Initially a code stroke was called and then discontinued. He denies any recent illness. She states that the patient is talkative but cannot make her wants and needs known. She is incontinent of bladder and bowel. No history of seizure activity. No recent fever chills cough diarrhea nausea vomiting. No complaints of pain.  In the emergency department she's afebrile hemodynamically stable and not hypoxic. She is somewhat lethargic but arousable.  Review of Systems:  Able to review of systems with patient due to dementia. Systems reviewed caregiver. Systems negative except as indicated in the history of present illness  Past Medical History  Diagnosis Date  . CVA 02/10/2008  . ENDOCARDITIS 09/29/2010  . HYPERLIPIDEMIA 02/14/2007  . HYPERTENSION 02/14/2007  . MYOCARDIAL INFARCTION, HX OF 02/14/2007  . OBESITY 01/16/2009  . OBSTRUCTIVE SLEEP APNEA 05/27/2007  . 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 CORONARY ARTERY DISEASE 02/14/2007  . DEMENTIA 01/20/2010  .  CAD (coronary artery disease)   . Myocardial infarct ([place])   . Edema   . Alzheimer disease   . GERD (gastroesophageal reflux disease)   . Glaucoma   . Gout   . Unspecified urinary incontinence   . Postmenopausal bleeding   . Morbid obesity ([place])   . Lack of coordination   . 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 Tubal ligation     Social History:  reports that she has never smoked. She has never used smokeless tobacco. She reports that she does not drink alcohol or use illicit drugs. Patient with dementia. Total assist. Incontinent No Known Allergies  Family History  Problem Relation Age of Onset  . Heart failure Mother     Deceased  . Kidney disease Brother     Deceased  . Heart disease Father     Deceased  . Cancer Mother   . Stroke Sister   . Hypertension Brother   . Hypertension Sister   . Heart disease Sister     Deceased  . Hypertension Sister     x2  . Hypertension Son     x2  . Glaucoma Son     Prior to Admission medications   Medication Sig Start Date End Date Taking? Authorizing Provider  aspirin ([employer] CHILDRENS ASPIRIN) 81 MG chewable tablet TAKE 1 TABLET (81 MG TOTAL) BY MOUTH DAILY. 05/03/15  Yes Brunetta Jeans, PA-C  atorvastatin (LIPITOR) 40 MG tablet Take 1 tablet (40 mg total) by mouth daily. 01/10/16  Yes Hoyt Koch, MD  Calcium-Vitamin D 600-200 MG-UNIT tablet Take 1 tablet by mouth daily.  Yes Historical Provider, MD  carvedilol (COREG) 25 MG tablet TAKE 1 TABLET BY MOUTH TWICE A DAY WITH A MEAL 01/10/16  Yes Hoyt Koch, MD  donepezil (ARICEPT) 10 MG tablet Take 1 tablet (10 mg total) by mouth at bedtime. 01/10/16  Yes Hoyt Koch, MD  furosemide (LASIX) 40 MG tablet Take 1 tablet (40 mg total) by mouth daily. 01/10/16  Yes Hoyt Koch, MD  latanoprost (XALATAN) 0.005 % ophthalmic solution Place 1 drop into both eyes at bedtime.   Yes Historical Provider, MD  lisinopril  (PRINIVIL,ZESTRIL) 40 MG tablet Take 1 tablet (40 mg total) by mouth daily. 01/10/16  Yes Hoyt Koch, MD   Physical Exam: Filed Vitals:   03/30/16 1234 03/30/16 1300 03/30/16 1330 03/30/16 1430  BP:  147/57 150/53 147/59  Pulse: 56 54 54 56  Temp: 98.2 F (36.8 C)     TempSrc: Oral     Resp: 13 12 10 16   Height: 5\' 2"  (1.575 m)     Weight: 95.6 kg (210 lb 12.2 oz)     SpO2: 95% 98% 96% 96%    Wt Readings from Last 3 Encounters:  03/30/16 95.6 kg (210 lb 12.2 oz)  01/15/16 98.884 kg (218 lb)  01/06/16 100.245 kg (221 lb)    General:  Appears calm and comfortable, Incised to verbal stimuli Eyes: PERRL, normal lids, irises & conjunctiva ENT: grossly normal hearing, lips & tongue Neck: no LAD, masses or thyromegaly Cardiovascular: RRR, no m/r/g. No LE edema.  Respiratory: CTA bilaterally, no w/r/r. Normal respiratory effort. Abdomen: soft, ntnd is a bowel sounds nontender Skin: no rash or induration seen on limited exam Musculoskeletal: grossly normal tone BUE/BLE, joints without swelling/erythema Psychiatric:  Neurologic: Drowsy but responds to verbal stimuli. Attempts to follow simple commands. A subtle symmetry           Labs on Admission:  Basic Metabolic Panel:  Recent Labs Lab 03/30/16 1225 03/30/16 1232  NA 142 140  K 2.9* 3.0*  CL 104 102  CO2 24  --   GLUCOSE 124* 121*  BUN 6 5*  CREATININE 0.95 0.70  CALCIUM 9.1  --    Liver Function Tests:  Recent Labs Lab 03/30/16 1225  AST 21  ALT 13*  ALKPHOS 63  BILITOT 0.8  PROT 6.5  ALBUMIN 3.5   No results for input(s): LIPASE, AMYLASE in the last 168 hours. No results for input(s): AMMONIA in the last 168 hours. CBC:  Recent Labs Lab 03/30/16 1225 03/30/16 1232  WBC 4.0  --   NEUTROABS 2.0  --   HGB 13.6 13.9  HCT 41.2 41.0  MCV 89.8  --   PLT 150  --    Cardiac Enzymes: No results for input(s): CKTOTAL, CKMB, CKMBINDEX, TROPONINI in the last 168 hours.  BNP (last 3 results) No  results for input(s): BNP in the last 8760 hours.  ProBNP (last 3 results) No results for input(s): PROBNP in the last 8760 hours.  CBG:  Recent Labs Lab 03/30/16 1237  GLUCAP 99    Radiological Exams on Admission: Ct Head Wo Contrast  03/30/2016  CLINICAL DATA:  Aphasia.  Code stroke. EXAM: CT HEAD WITHOUT CONTRAST TECHNIQUE: Contiguous axial images were obtained from the base of the skull through the vertex without intravenous contrast. COMPARISON:  CT 09/16/2015 and 02/26/2015. FINDINGS: Brain: There is no evidence of acute intracranial hemorrhage, mass lesion, brain edema or extra-axial fluid collection. Chronic ventriculomegaly appears similar to prior studies.  There is chronic periventricular white matter disease with chronic asymmetric left frontal lobe encephalomalacia. There is no CT evidence of acute cortical infarction. Extensive intracranial vascular calcifications are noted. Bones/sinuses/visualized face: The visualized paranasal sinuses, mastoid air cells and middle ears are clear. The calvarium is intact. IMPRESSION: 1. No CT evidence of acute stroke or hemorrhage. 2. Chronic ventriculomegaly, periventricular white matter disease and left frontal lobe encephalomalacia. 3. These results were called by telephone at the time of interpretation on 03/30/2016 at 12:41 pm to Shanon Brow, [place] for Dr. Leonel Ramsay , who verbally acknowledged these results. Electronically Signed   By: Richardean Sale M.D.   On: 03/30/2016 12:51    EKG: Independently reviewed. Monitor rhythm  Assessment/Plan Principal Problem:   Acute encephalopathy Active Problems:   OBESITY   Dementia   Obstructive sleep apnea   Coronary atherosclerosis   Essential hypertension   Seizure (HCC)   Hypokalemia  #1. Acute encephalopathy likely related to new onset seizure versus stroke. Stroke risk factors include hyperlipidemia and hypertension. Improving at time of admission. CT of the head no evidence of acute stroke or  hemorrhage. No metabolic derangements. She is afebrile and nontoxic appearing She was evaluated by neuro in the emergency department who recommended For an admission for observation -Monitor on telemetry -Nothing by mouth until more alert -Advance diet as able -Continue Keppra per neuro recommendations -Seizure precautions  #2. Hypokalemia. Mild. Likely related to Lasix -We'll replete and recheck -Check magnesium  #3. Hypertension. Fair control in the emergency department. Home medications include Lasix, Coreg, lisinopril -We'll hold these for now -plan restart lisinopril tomorrow    Code Status: full DVT Prophylaxis: Family Communication: Daughters and son at bedside Disposition Plan: home hopefully 24 hours  Time spent: 65 minutes  [facility]

## 2016-03-30 NOTE — ED Notes (Signed)
Pt. Brief and bed linen changed at this time. Pt. Washed. Vaginal bleeding noted at this time. Daughter says pt. Still has menstrual cycle.

## 2016-03-30 NOTE — Progress Notes (Signed)
Pt arrived to 5M16 via stretcher.  Alert, minimally conversant, following commands.  Daughters at bedside.  VSS.  No c/o pain at this time.  Telemetry applied and CCMD notified.  Will continue to monitor. Cori Razor, RN

## 2016-03-30 NOTE — Consult Note (Signed)
Requesting Physician: Dr. Colvin Caroli    Chief Complaint: Code stroke  History obtained from:  family  HPI:                                                                                                                                         Diane Frey is an 75 y.o. female with known Dementia in which her family cares for her and takes care of all her ADL. She only feed herself and able to watch TV. At baseline she is unable to clean herself, toilet herself, cook or clean. She spends tha majority of the day watching TV.  She was with her daughter when she was noted to turn her head to the right at about 11:30.  She was not talkative and did not respond. She then was noted to suddenly "vocalizationfollowed by tonic activity of bilateral arm and leg then noted to have jerking of extremities". [facility] was brought [facility] via [facility] for code stroke. Per EMS her mentation was better but still was not talking. Currently she fixates but follows no commands.   Date last known well: 4.24.2017 Time last known well: Time: 11:30 tPA Given: No: seizure at onset No Symptoms         0 No significant disability/able to carry out all usual activities   1 Unable to carry out all previous activities but looks after own affairs             2 Requires help but walks without assistance     3 Unable to walk without assistance/unable to handle own bodily needs 4 Bedridden/incontinent        5 Dead          6  Modified Rankin: Rankin Score=4    Past Medical History  Diagnosis Date  . CVA 02/10/2008  . ENDOCARDITIS 09/29/2010  . HYPERLIPIDEMIA 02/14/2007  . HYPERTENSION 02/14/2007  . MYOCARDIAL INFARCTION, HX OF 02/14/2007  . OBESITY 01/16/2009  . OBSTRUCTIVE SLEEP APNEA 05/27/2007  . 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 CORONARY ARTERY DISEASE 02/14/2007  . DEMENTIA 01/20/2010  . CAD (coronary artery disease)   . Myocardial infarct ([place])   . Edema   . Alzheimer disease   . GERD (gastroesophageal  reflux disease)   . Glaucoma   . Gout   . Unspecified urinary incontinence   . Postmenopausal bleeding   . Morbid obesity ([place])   . Lack of coordination   . 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 Tubal ligation      Family History  Problem Relation Age of Onset  . Heart failure Mother     Deceased  . Kidney disease Brother     Deceased  . Heart disease Father     Deceased  . Cancer Mother   . Stroke Sister   .  Hypertension Brother   . Hypertension Sister   . Heart disease Sister     Deceased  . Hypertension Sister     x2  . Hypertension Son     x2  . Glaucoma Son    Social History:  reports that she has never smoked. She has never used smokeless tobacco. She reports that she does not drink alcohol or use illicit drugs.  Allergies: No Known Allergies  Medications:                                                                                                                           No current facility-administered medications for this encounter.   Current Outpatient Prescriptions  Medication Sig Dispense Refill  . amLODipine (NORVASC) 5 MG tablet Take 1 tablet (5 mg total) by mouth daily. 30 tablet 5  . aspirin ([employer] CHILDRENS ASPIRIN) 81 MG chewable tablet TAKE 1 TABLET (81 MG TOTAL) BY MOUTH DAILY. 36 tablet 5  . atorvastatin (LIPITOR) 40 MG tablet Take 1 tablet (40 mg total) by mouth daily. 30 tablet 5  . carvedilol (COREG) 25 MG tablet TAKE 1 TABLET BY MOUTH TWICE A DAY WITH A MEAL 60 tablet 5  . [facility]S ACETAMINOPHEN 325 MG tablet TAKE 2 TABLETS BY MOUTH TWICE A DAY 100 tablet 0  . donepezil (ARICEPT) 10 MG tablet Take 1 tablet (10 mg total) by mouth at bedtime. 30 tablet 5  . dorzolamide-timolol (COSOPT) 22.3-6.8 MG/ML ophthalmic solution Place 1 drop into both eyes 2 (two) times daily.  12  . ferrous sulfate 325 (65 FE) MG tablet TAKE 1 TABLET (325 MG TOTAL) BY MOUTH ONCE DAILY. 30 tablet 5  . furosemide (LASIX) 40 MG  tablet Take 1 tablet (40 mg total) by mouth daily. 30 tablet 5  . lisinopril (PRINIVIL,ZESTRIL) 40 MG tablet Take 1 tablet (40 mg total) by mouth daily. 30 tablet 5  . memantine (NAMENDA XR) 28 MG CP24 24 hr capsule Take 1 capsule (28 mg total) by mouth daily. 30 capsule 5  . potassium chloride SA (KLOR-CON M20) 20 MEQ tablet Take 1 tablet (20 mEq total) by mouth daily. 30 tablet 5  . tiZANidine (ZANAFLEX) 2 MG tablet Take 1 tablet (2 mg total) by mouth every 6 (six) hours as needed for muscle spasms. 60 tablet 0  . TRAVATAN Z 0.004 % SOLN ophthalmic solution Place 1 drop into both eyes at bedtime.  12     ROS:  History obtained from unobtainable from patient due to mental status   Neurologic Examination:                                                                                                      Height 5' 1.81" (1.57 m), weight 96.5 kg (212 lb 11.9 oz).  HEENT-  Normocephalic, no lesions, without obvious abnormality.  Normal external eye and conjunctiva.  Normal TM's bilaterally.  Normal auditory canals and external ears. Normal external nose, mucus membranes and septum.  Normal pharynx. Cardiovascular- S1, S2 normal, pulses palpable throughout   Lungs- chest clear, no wheezing, rales, normal symmetric air entry Abdomen- soft, non-tender; bowel sounds normal; no masses,  no organomegaly Extremities- no edema Lymph-no adenopathy palpable Musculoskeletal-no joint tenderness, deformity or swelling Skin-warm and dry, no hyperpigmentation, vitiligo, or suspicious lesions  Neurological Examination Mental Status: Will track fingers but currently is non-vocal and likely post-ictal. Follows no commands.  Cranial Nerves: II: blinks to threat bilaterally but not consistent,  pupils equal, round, reactive to light and accommodation III,IV, VI: ptosis not  present, extra-ocular motions intact bilaterally V,VII: face symmetric, facial light touch sensation normal bilaterally VIII: hearing normal bilaterally IX,X: uvula rises symmetrically XI: bilateral shoulder shrug [id number] to visualize Motor: Moving all extremities antigravity spontaniously Sensory: Withdraws from noxious stimuli bilaterally Deep Tendon Reflexes: 2+ and symmetric throughout UE and 1+ at [place] wth no AJ Plantars: Right: downgoing   Left: downgoing Cerebellar: Unable to assess Gait: not tested       Lab Results: Basic Metabolic Panel:  Recent Labs Lab 03/30/16 1232  NA 140  K 3.0*  CL 102  GLUCOSE 121*  BUN 5*  CREATININE 0.70    Liver Function Tests: No results for input(s): AST, ALT, ALKPHOS, BILITOT, PROT, ALBUMIN in the last 168 hours. No results for input(s): LIPASE, AMYLASE in the last 168 hours. No results for input(s): AMMONIA in the last 168 hours.  CBC:  Recent Labs Lab 03/30/16 1225 03/30/16 1232  WBC 4.0  --   NEUTROABS 2.0  --   HGB 13.6 13.9  HCT 41.2 41.0  MCV 89.8  --   PLT 150  --     Cardiac Enzymes: No results for input(s): CKTOTAL, CKMB, CKMBINDEX, TROPONINI in the last 168 hours.  Lipid Panel: No results for input(s): CHOL, TRIG, HDL, CHOLHDL, VLDL, LDLCALC in the last 168 hours.  CBG: No results for input(s): GLUCAP in the last 168 hours.  Microbiology: Results for orders placed or performed in visit on 05/17/15  Urine Culture     Status: None   Collection Time: 05/17/15  3:19 PM  Result Value Ref Range Status   Culture ESCHERICHIA COLI  Final   Colony Count >=100,000 COLONIES/ML  Final   Organism ID, Bacteria ESCHERICHIA COLI  Final      Susceptibility   Escherichia coli -  (no method available)    AMPICILLIN <=2 Sensitive     AMOX/CLAVULANIC <=2 Sensitive     AMPICILLIN/SULBACTAM <=2 Sensitive     PIP/TAZO <=4 Sensitive     IMIPENEM <=  0.25 Sensitive     CEFAZOLIN <=4 Sensitive     CEFTRIAXONE <=1  Sensitive     CEFTAZIDIME <=1 Sensitive     CEFEPIME <=1 Sensitive     GENTAMICIN <=1 Sensitive     TOBRAMYCIN <=1 Sensitive     CIPROFLOXACIN <=0.25 Sensitive     LEVOFLOXACIN <=0.12 Sensitive     NITROFURANTOIN <=16 Sensitive     TRIMETH/SULFA <=20 Sensitive     Coagulation Studies: No results for input(s): LABPROT, INR in the last 72 hours.  Imaging: No results found.     Assessment and plan discussed with with attending physician and they are in agreement.    Etta Quill PA-C [facility] [phone]  03/30/2016, 12:35 PM   Assessment: 75 y.o. female with new onset seizure.  Currently able to track finger but remains post-ictal and non verbal. tPA was not given due to seizure at onset.   Stroke Risk Factors - hyperlipidemia and hypertension  1) start keppra 1g x1, then 500mg  BID 2) EEG 3) MRI  Roland Rack, MD [facility] [phone]  If 7pm- 7am, please page neurology on call as listed in [facility].

## 2016-03-30 NOTE — ED Provider Notes (Signed)
CSN: [id number]     Arrival date & time 03/30/16  1217 History   First MD Initiated Contact with Patient 03/30/16 1229     Chief Complaint  Patient presents with  . Altered Mental Status    @EDPCLEARED @ (Consider location/radiation/quality/duration/timing/severity/associated sxs/prior Treatment) HPI Patient initially was evaluated as a code stroke. She had been seen by Dr. Leonel Ramsay. Family reports that the patient does not have much independent activities. She does not independently bath, ambulate or provide for her own needs and her home. She predominantly watches television and is able to self feed. Family noticed that she just stared off to the right at approximately 11:30 and did not have normal speech interaction. They had not identified that she been ill recently with fevers, baseline function change. Past Medical History  Diagnosis Date  . CVA 02/10/2008  . ENDOCARDITIS 09/29/2010  . HYPERLIPIDEMIA 02/14/2007  . HYPERTENSION 02/14/2007  . MYOCARDIAL INFARCTION, HX OF 02/14/2007  . OBESITY 01/16/2009  . OBSTRUCTIVE SLEEP APNEA 05/27/2007  . 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 CORONARY ARTERY DISEASE 02/14/2007  . DEMENTIA 01/20/2010  . CAD (coronary artery disease)   . Myocardial infarct ([place])   . Edema   . Alzheimer disease   . GERD (gastroesophageal reflux disease)   . Glaucoma   . Gout   . Unspecified urinary incontinence   . Postmenopausal bleeding   . Morbid obesity ([place])   . Lack of coordination   . Aneurysm ([place])     Head   Past Surgical History  Procedure Laterality Date  . Tubal ligation     Family History  Problem Relation Age of Onset  . Heart failure Mother     Deceased  . Kidney disease Brother     Deceased  . Heart disease Father     Deceased  . Cancer Mother   . Stroke Sister   . Hypertension Brother   . Hypertension Sister   . Heart disease Sister     Deceased  . Hypertension Sister     x2  . Hypertension Son     x2  . Glaucoma Son     Social History  Substance Use Topics  . Smoking status: Never Smoker   . Smokeless tobacco: Never Used  . Alcohol Use: No   OB History    Gravida Para Term Preterm AB TAB SAB Ectopic Multiple Living   7 7 7       7      Review of Systems 10 Systems reviewed and are negative for acute change except as noted in the HPI. , Level V caveat history is per patient's family members.   Allergies  Review of patient's allergies indicates no known allergies.  Home Medications   Prior to Admission medications   Medication Sig Start Date End Date Taking? Authorizing Provider  aspirin ([employer] CHIL[employer]ENS ASPIRIN) 81 MG chewable tablet TAKE 1 TABLET (81 MG TOTAL) BY MOUTH DAILY. 05/03/15  Yes Brunetta Jeans, PA-C  atorvastatin (LIPITOR) 40 MG tablet Take 1 tablet (40 mg total) by mouth daily. 01/10/16  Yes Hoyt Koch, MD  Calcium-Vitamin D 600-200 MG-UNIT tablet Take 1 tablet by mouth daily.   Yes Historical Provider, MD  carvedilol (COREG) 25 MG tablet TAKE 1 TABLET BY MOUTH TWICE A DAY WITH A MEAL 01/10/16  Yes Hoyt Koch, MD  donepezil (ARICEPT) 10 MG tablet Take 1 tablet (10 mg total) by mouth at bedtime. 01/10/16  Yes Hoyt Koch, MD  furosemide (LASIX)  40 MG tablet Take 1 tablet (40 mg total) by mouth daily. 01/10/16  Yes Hoyt Koch, MD  latanoprost (XALATAN) 0.005 % ophthalmic solution Place 1 drop into both eyes at bedtime.   Yes Historical Provider, MD  lisinopril (PRINIVIL,ZESTRIL) 40 MG tablet Take 1 tablet (40 mg total) by mouth daily. 01/10/16  Yes Hoyt Koch, MD  acetaminophen (TYLENOL) 325 MG tablet Take 2 tablets (650 mg total) by mouth every 6 (six) hours as needed for mild pain (or Fever >/= 101). 04/02/16   Robbie Lis, MD  levETIRAcetam (KEPPRA) 500 MG tablet Take 1 tablet (500 mg total) by mouth 2 (two) times daily. 04/02/16   Robbie Lis, MD   BP 107/38 mmHg  Pulse 57  Temp(Src) 98 F (36.7 C) (Oral)  Resp 18  Ht 5\' 2"  (1.575 m)   Wt 210 lb 12.2 oz (95.6 kg)  BMI 38.54 kg/m2  SpO2 98% Physical Exam  Constitutional:  Patient is calm. She does not appear distressed. He is awake. No respiratory distress. She is not ill in appearance.  HENT:  Head: Normocephalic and atraumatic.  Nose: Nose normal.  Mouth/Throat: Oropharynx is clear and moist.  Eyes: EOM are normal. Pupils are equal, round, and reactive to light.  Cardiovascular: Normal rate, regular rhythm, normal heart sounds and intact distal pulses.   Pulmonary/Chest: Effort normal and breath sounds normal.  Abdominal: Soft. She exhibits no distension. There is no tenderness.  Musculoskeletal: She exhibits no edema.  Neurological: She is alert.  Patient is awake. She follows minimal commands. She did make a few vocalizations that were intelligible.  Skin: Skin is warm and dry.    ED Course  Procedures (including critical care time) Labs Review Labs Reviewed  COMPREHENSIVE METABOLIC PANEL - Abnormal; Notable for the following:    Potassium 2.9 (*)    Glucose, Bld 124 (*)    ALT 13 (*)    GFR calc non Af Amer 58 (*)    All other components within normal limits  URINALYSIS, ROUTINE W REFLEX MICROSCOPIC (NOT AT [facility]) - Abnormal; Notable for the following:    Color, Urine STRAW (*)    Hgb urine dipstick LARGE (*)    All other components within normal limits  URINE MICROSCOPIC-ADD ON - Abnormal; Notable for the following:    Squamous Epithelial / LPF 0-5 (*)    Bacteria, UA RARE (*)    All other components within normal limits  BASIC METABOLIC PANEL - Abnormal; Notable for the following:    Potassium 3.3 (*)    Chloride 100 (*)    BUN <5 (*)    Calcium 8.6 (*)    All other components within normal limits  BASIC METABOLIC PANEL - Abnormal; Notable for the following:    Potassium 3.3 (*)    Calcium 8.7 (*)    All other components within normal limits  MAGNESIUM - Abnormal; Notable for the following:    Magnesium 1.6 (*)    All other components within  normal limits  BASIC METABOLIC PANEL - Abnormal; Notable for the following:    BUN <5 (*)    Calcium 8.8 (*)    All other components within normal limits  GLUCOSE, CAPILLARY - Abnormal; Notable for the following:    Glucose-Capillary 101 (*)    All other components within normal limits  I-STAT CHEM 8, ED - Abnormal; Notable for the following:    Potassium 3.0 (*)    BUN 5 (*)  Glucose, Bld 121 (*)    Calcium, Ion 1.01 (*)    All other components within normal limits  ETHANOL  PROTIME-INR  APTT  CBC  DIFFERENTIAL  URINE RAPID DRUG SCREEN, HOSP PERFORMED  MAGNESIUM  TSH  CBC  MAGNESIUM  CBC  BASIC METABOLIC PANEL  I-STAT TROPOININ, ED  CBG MONITORING, ED    Imaging Review No results found. I have personally reviewed and evaluated these images and lab results as part of my medical decision-making.   EKG Interpretation   Date/Time:  Monday March 30 2016 12:39:58 EDT Ventricular Rate:  54 PR Interval:  143 QRS Duration: 93 QT Interval:  446 QTC Calculation: 423 R Axis:   3 Text Interpretation:  Sinus bradycardia No significant change since last  tracing Confirmed by Ashok Cordia  MD, Lennette Bihari ([id number]) on 04/01/2016 4:20:59 PM     Consultation: Dr. Leonel Ramsay first evaluated the patient for code stroke. He did advise me that he felt this was most likely seizure and not CVA. No TPA will be administered. Consultation: [facility] for admission. MDM   Final diagnoses:  Altered mental state   Patient at baseline has significantly decreased ADLs due to Alzheimer's dementia. She does interact with family members by conversation and watches television. An event occurred this afternoon her by the patient turned her head and became nonconversant. Also there is report of some shaking extremity activity. After evaluation, neurology felt this was more likely to be seizure than CVA. For a period of observation, the patient did begin to have limited but purposeful vocalizations.  Patient is not toxic in appearance and has stable vital signs. View of systems does not indicate intercurrent illness. Patient will be admitted for ongoing observation.    Charlesetta Shanks, MD 04/08/16 [phone]

## 2016-03-31 DIAGNOSIS — R319 Hematuria, unspecified: Secondary | ICD-10-CM | POA: Diagnosis present

## 2016-03-31 DIAGNOSIS — I252 Old myocardial infarction: Secondary | ICD-10-CM | POA: Diagnosis not present

## 2016-03-31 DIAGNOSIS — R569 Unspecified convulsions: Secondary | ICD-10-CM | POA: Diagnosis present

## 2016-03-31 DIAGNOSIS — R4182 Altered mental status, unspecified: Secondary | ICD-10-CM | POA: Diagnosis not present

## 2016-03-31 DIAGNOSIS — I10 Essential (primary) hypertension: Secondary | ICD-10-CM | POA: Diagnosis not present

## 2016-03-31 DIAGNOSIS — Z79899 Other long term (current) drug therapy: Secondary | ICD-10-CM | POA: Diagnosis not present

## 2016-03-31 DIAGNOSIS — R2689 Other abnormalities of gait and mobility: Secondary | ICD-10-CM | POA: Diagnosis not present

## 2016-03-31 DIAGNOSIS — Z8673 Personal history of transient ischemic attack (TIA), and cerebral infarction without residual deficits: Secondary | ICD-10-CM | POA: Diagnosis not present

## 2016-03-31 DIAGNOSIS — M109 Gout, unspecified: Secondary | ICD-10-CM | POA: Diagnosis present

## 2016-03-31 DIAGNOSIS — I2510 Atherosclerotic heart disease of native coronary artery without angina pectoris: Secondary | ICD-10-CM | POA: Diagnosis present

## 2016-03-31 DIAGNOSIS — F0390 Unspecified dementia without behavioral disturbance: Secondary | ICD-10-CM | POA: Diagnosis not present

## 2016-03-31 DIAGNOSIS — F0280 Dementia in other diseases classified elsewhere without behavioral disturbance: Secondary | ICD-10-CM | POA: Diagnosis present

## 2016-03-31 DIAGNOSIS — Z7982 Long term (current) use of aspirin: Secondary | ICD-10-CM | POA: Diagnosis not present

## 2016-03-31 DIAGNOSIS — M6281 Muscle weakness (generalized): Secondary | ICD-10-CM | POA: Diagnosis not present

## 2016-03-31 DIAGNOSIS — G4733 Obstructive sleep apnea (adult) (pediatric): Secondary | ICD-10-CM | POA: Diagnosis present

## 2016-03-31 DIAGNOSIS — E876 Hypokalemia: Secondary | ICD-10-CM | POA: Diagnosis not present

## 2016-03-31 DIAGNOSIS — G9389 Other specified disorders of brain: Secondary | ICD-10-CM | POA: Diagnosis present

## 2016-03-31 DIAGNOSIS — E785 Hyperlipidemia, unspecified: Secondary | ICD-10-CM | POA: Diagnosis present

## 2016-03-31 DIAGNOSIS — K219 Gastro-esophageal reflux disease without esophagitis: Secondary | ICD-10-CM | POA: Diagnosis present

## 2016-03-31 DIAGNOSIS — R488 Other symbolic dysfunctions: Secondary | ICD-10-CM | POA: Diagnosis not present

## 2016-03-31 DIAGNOSIS — Z6838 Body mass index (BMI) 38.0-38.9, adult: Secondary | ICD-10-CM | POA: Diagnosis not present

## 2016-03-31 DIAGNOSIS — G309 Alzheimer's disease, unspecified: Secondary | ICD-10-CM | POA: Diagnosis present

## 2016-03-31 DIAGNOSIS — H409 Unspecified glaucoma: Secondary | ICD-10-CM | POA: Diagnosis present

## 2016-03-31 DIAGNOSIS — G9340 Encephalopathy, unspecified: Secondary | ICD-10-CM | POA: Diagnosis not present

## 2016-03-31 DIAGNOSIS — G8194 Hemiplegia, unspecified affecting left nondominant side: Secondary | ICD-10-CM | POA: Diagnosis not present

## 2016-03-31 DIAGNOSIS — G9341 Metabolic encephalopathy: Secondary | ICD-10-CM | POA: Diagnosis present

## 2016-03-31 LAB — BASIC METABOLIC PANEL
Anion gap: 13 (ref 5–15)
CALCIUM: 8.6 mg/dL — AB (ref 8.9–10.3)
CO2: 29 mmol/L (ref 22–32)
CREATININE: 0.91 mg/dL (ref 0.44–1.00)
Chloride: 100 mmol/L — ABNORMAL LOW (ref 101–111)
GFR calc Af Amer: >60 mL/min (ref >60)
Glucose, Bld: 82 mg/dL (ref 65–99)
Potassium: 3.3 mmol/L — ABNORMAL LOW (ref 3.5–5.1)
SODIUM: 142 mmol/L (ref 135–145)

## 2016-03-31 MED ORDER — HYDRALAZINE HCL 20 MG/ML IJ SOLN: 5.0000 mg | Freq: Three times a day (TID) | INTRAMUSCULAR | Status: DC | PRN

## 2016-03-31 MED ORDER — POTASSIUM CHLORIDE 10 MEQ/100ML IV SOLN
10.0000 meq | INTRAVENOUS | Status: AC
  Administered 2016-03-31 (×3): 10 meq via INTRAVENOUS
  Filled 2016-03-31 (×3): qty 100

## 2016-03-31 NOTE — Evaluation (Signed)
Physical Therapy Evaluation Patient Details Name: Diane Frey MRN: [id number] DOB: 1941-11-24 Today's Date: 03/31/2016   History of Present Illness  Diane Frey is a 75 y.o. female with a Past Medical History of CVA, HLD, HTN, MI, OSA, CAD, dementia, CAD, Dementia, GERD, Gout, who presents with acute encephalopathy secondary to seizures, HypoK, HTN, dementia at baseline.   Clinical Impression  Pt minimally conversant and demo's significant functional decline compared to a week ago per RNs report. Pt currently requires maxax2 for safe OOB mobility. Recommend ST-SNF to progress mobility to PLOF for safe transition home.    Follow Up Recommendations SNF;Supervision/Assistance - 24 hour pt can go home if daughter feels she can handle max level of assist however pt was only required minA PTA    Equipment Recommendations  None recommended by PT    Recommendations for Other Services       Precautions / Restrictions Precautions Precautions: Fall Precaution Comments: h/o dementia Restrictions Weight Bearing Restrictions: No      Mobility  Bed Mobility Overal bed mobility: Needs Assistance;+2 for physical assistance Bed Mobility: Rolling;Sidelying to Sit;Sit to Sidelying Rolling: Mod assist Sidelying to sit: Max assist     Sit to sidelying: Max assist;+2 for physical assistance General bed mobility comments: max directional v/c's, assist for trunk elevation and LE management due to limited ability to sequence   Transfers Overall transfer level: Needs assistance Equipment used:  (2 person lift with gait belt) Transfers: Sit to/from Stand Sit to Stand: Max assist;+2 physical assistance         General transfer comment: max directional v/c's, unable to achieve and maintain full upright standing posture. pt with stool incontinence during standing  Ambulation/Gait             General Gait Details: pt unable at this time  Stairs            Wheelchair  Mobility    Modified Rankin (Stroke Patients Only) Modified Rankin (Stroke Patients Only) Pre-Morbid Rankin Score: Moderately severe disability Modified Rankin: Severe disability     Balance Overall balance assessment: Needs assistance Sitting-balance support: Feet supported;Bilateral upper extremity supported Sitting balance-Leahy Scale: Poor Sitting balance - Comments: pt fluctuated between min guard to maxA with onset of fatigue. pt with L lateral lean with onset of fatigue requiring maxA   Standing balance support: Bilateral upper extremity supported Standing balance-Leahy Scale: Poor Standing balance comment: unable to maintain standing without assist                             Pertinent Vitals/Pain Pain Assessment: No/denies pain    Home Living Family/patient expects to be discharged to:: Unsure Living Arrangements: Children (lives with daughter)               Additional Comments: family not present but per RN pt was ambulatory with RW up until a few days ago. pt recently using w/c.     Prior Function Level of Independence: Needs assistance   Gait / Transfers Assistance Needed: pt was ambulatory with RW but recently had to use wc  ADL's / Homemaking Assistance Needed: needs assist for all ADLs        Hand Dominance        Extremity/Trunk Assessment   Upper Extremity Assessment: Difficult to assess due to impaired cognition;Generalized weakness           Lower Extremity Assessment: Generalized weakness;Difficult to assess due to  impaired cognition      Cervical / Trunk Assessment: Normal  Communication   Communication: Expressive difficulties  Cognition Arousal/Alertness: Lethargic Behavior During Therapy: Flat affect Overall Cognitive Status: History of cognitive impairments - at baseline (stated birthdate and then perseverated on February)                      General Comments      Exercises         Assessment/Plan    PT Assessment Patient needs continued PT services  PT Diagnosis Difficulty walking;Generalized weakness   PT Problem List Decreased strength;Decreased activity tolerance;Decreased balance;Decreased mobility;Decreased knowledge of use of DME;Decreased cognition;Decreased coordination;Decreased safety awareness  PT Treatment Interventions DME instruction;Gait training;Stair training;Functional mobility training;Therapeutic activities;Therapeutic exercise;Balance training;Neuromuscular re-education   PT Goals (Current goals can be found in the Care Plan section) Acute Rehab PT Goals Patient Stated Goal: didn't state PT Goal Formulation: Patient unable to participate in goal setting Time For Goal Achievement: 04/14/16 Potential to Achieve Goals: Good    Frequency Min 3X/week   Barriers to discharge        Co-evaluation               End of Session Equipment Utilized During Treatment: Gait belt Activity Tolerance: Patient limited by lethargy Patient left: in bed;with call bell/phone within reach;with nursing/sitter in room;with bed alarm set Nurse Communication: Mobility status    Functional Assessment Tool Used: clinical judgement Functional Limitation: Mobility: Walking and moving around Mobility: Walking and Moving Around Current Status [phone]): At least 80 percent but less than 100 percent impaired, limited or restricted Mobility: Walking and Moving Around Goal Status [phone]): At least 40 percent but less than 60 percent impaired, limited or restricted    Time: 1445-1513 PT Time Calculation (min) (ACUTE ONLY): 28 min   Charges:   PT Evaluation $PT Eval Moderate Complexity: 1 Procedure PT Treatments $Therapeutic Activity: 8-22 mins   PT G Codes:   PT G-Codes **NOT FOR INPATIENT CLASS** Functional Assessment Tool Used: clinical judgement Functional Limitation: Mobility: Walking and moving around Mobility: Walking and Moving Around Current  Status [id number]): At least 80 percent but less than 100 percent impaired, limited or restricted Mobility: Walking and Moving Around Goal Status [phone]): At least 40 percent but less than 60 percent impaired, limited or restricted    Kingsley Callander 03/31/2016, 3:58 PM   Kittie Plater, PT, DPT Pager #: [phone] Office #: [phone]

## 2016-03-31 NOTE — Progress Notes (Addendum)
Patient ID: Diane Frey, female   DOB: [birth year], 75 y.o.   MRN: [id number]  PROGRESS NOTE    Diane Frey  [id number] DOB: 1941-09-20 DOA: 03/30/2016  PCP: Hoyt Koch, MD  Outpatient Specialists: none   Brief Narrative:  75 y.o. female with a past medical history includes dementia, CAD, sleep apnea, hypertension who presented to [facility] ED with altered mental status. Patient's family brought the patient for evaluation because patient did not respond to questions or simple commands. The symptoms started one day prior to the admission. The patient's family noticed also that the patient had bilateral arm and leg jerking which lasted about 5 minutes.  On arrival to ED, stroke code was called and then discontinued. Patient was hemodynamically stable in ED. Blood work was notable for potassium of 2.9 which was supplemented. CT head showed no acute intracranial findings. Patient does have chronic ventriculomegaly, periventricular white matter disease and left frontal lobe encephalomalacia. Neurology has seen the patient in consultation.   Assessment & Plan:   Principal Problem:   Acute metabolic encephalopathy / Seizures / Dementia  - No acute intracranial findings on CT scan. Patient does however have chronic ventriculomegaly and periventricular white matter changes as well as left frontal lobe encephalomalacia as noted on CT scan - Continue Keppra 500 mg IV every 12 hours - Per neurology, obtain EEG and MRI brain - Obtain physical therapy evaluation  Active Problems:   Obstructive sleep apnea - Stable respiratory status    Essential hypertension - Blood pressure 139/94. Lasix on hold. We will use hydralazine 5 mg IV every 8 hours as needed if blood pressure above 150/90    Hypokalemia - Likely from Lasix which patient takes at home - Potassium supplemented - Follow-up BMP tomorrow morning   DVT prophylaxis: Lovenox subcutaneous Code Status: full code  Family  Communication: Family not at the bedside Disposition Plan: needs PT eval for safe discharge plan    Consultants:   Neurology   Physical therapy  Procedures:   None   Antimicrobials:   None    Subjective: Disoriented but awake.  Objective: Filed Vitals:   03/30/16 1430 03/30/16 2055 03/31/16 0201 03/31/16 0949  BP: 147/59 176/75 150/98 139/94  Pulse: 56 61 66 54  Temp:  98.6 F (37 C) 98.4 F (36.9 C) 98.5 F (36.9 C)  TempSrc:  Oral Oral Axillary  Resp: 16 16 16 18   Height:      Weight:      SpO2: 96% 100% 99% 99%   No intake or output data in the 24 hours ending 03/31/16 1029 Filed Weights   03/30/16 1200 03/30/16 1234  Weight: 96.5 kg (212 lb 11.9 oz) 95.6 kg (210 lb 12.2 oz)    Examination:  General exam: Appears confused, no distress  Respiratory system: Clear to auscultation. Respiratory effort normal. Cardiovascular system: S1 & S2 heard, RRR. No JVD, murmurs, rubs, gallops or clicks. No pedal edema. Gastrointestinal system: Abdomen is nondistended, soft and nontender. No organomegaly or masses felt. Normal bowel sounds heard. Central nervous system: Disoriented. No focal neurological deficits. Extremities: No tenderness, no cyanosis  Skin: No rashes, lesions or ulcers Psychiatry: Unable to assess due to altered mental status   Data Reviewed: I have personally reviewed following labs and imaging studies  CBC:  Recent Labs Lab 03/30/16 1225 03/30/16 1232  WBC 4.0  --   NEUTROABS 2.0  --   HGB 13.6 13.9  HCT 41.2 41.0  MCV 89.8  --  PLT 150  --    Basic Metabolic Panel:  Recent Labs Lab 03/30/16 1225 03/30/16 1232 03/30/16 1713 03/31/16 0536  NA 142 140  --  142  K 2.9* 3.0*  --  3.3*  CL 104 102  --  100*  CO2 24  --   --  29  GLUCOSE 124* 121*  --  82  BUN 6 5*  --  <5*  CREATININE 0.95 0.70  --  0.91  CALCIUM 9.1  --   --  8.6*  MG  --   --  1.7  --    GFR: Estimated Creatinine Clearance: 59.4 mL/min (by C-G formula based  on Cr of 0.91). Liver Function Tests:  Recent Labs Lab 03/30/16 1225  AST 21  ALT 13*  ALKPHOS 63  BILITOT 0.8  PROT 6.5  ALBUMIN 3.5   No results for input(s): LIPASE, AMYLASE in the last 168 hours. No results for input(s): AMMONIA in the last 168 hours. Coagulation Profile:  Recent Labs Lab 03/30/16 1225  INR 1.16   Cardiac Enzymes: No results for input(s): CKTOTAL, CKMB, CKMBINDEX, TROPONINI in the last 168 hours. BNP (last 3 results) No results for input(s): PROBNP in the last 8760 hours. HbA1C: No results for input(s): HGBA1C in the last 72 hours. CBG:  Recent Labs Lab 03/30/16 1237  GLUCAP 99   Lipid Profile: No results for input(s): CHOL, HDL, LDLCALC, TRIG, CHOLHDL, LDLDIRECT in the last 72 hours. Thyroid Function Tests:  Recent Labs  03/30/16 1717  TSH 3.033   Anemia Panel: No results for input(s): VITAMINB12, FOLATE, FERRITIN, TIBC, IRON, RETICCTPCT in the last 72 hours. Urine analysis:    Component Value Date/Time   COLORURINE STRAW* 03/30/2016 1407   APPEARANCEUR CLEAR 03/30/2016 1407   LABSPEC 1.006 03/30/2016 1407   PHURINE 7.0 03/30/2016 1407   GLUCOSEU NEGATIVE 03/30/2016 1407   GLUCOSEU NEGATIVE 06/03/2015 1344   HGBUR LARGE* 03/30/2016 1407   BILIRUBINUR NEGATIVE 03/30/2016 1407   BILIRUBINUR neg 05/17/2015 1134   KETONESUR NEGATIVE 03/30/2016 1407   PROTEINUR NEGATIVE 03/30/2016 1407   PROTEINUR neg 05/17/2015 1134   UROBILINOGEN 0.2 09/16/2015 1508   UROBILINOGEN 0.2 05/17/2015 1134   NITRITE NEGATIVE 03/30/2016 1407   NITRITE neg 05/17/2015 1134   LEUKOCYTESUR NEGATIVE 03/30/2016 1407   Sepsis Labs: @LABRCNTIP (procalcitonin:4,lacticidven:4)  No results found for this or any previous visit (from the past 240 hour(s)).   Radiology Studies: Ct Head Wo Contrast 03/30/2016  1. No CT evidence of acute stroke or hemorrhage. 2. Chronic ventriculomegaly, periventricular white matter disease and left frontal lobe encephalomalacia.    Scheduled Meds: . enoxaparin (LOVENOX)   40 mg Subcutaneous Q24H  . levETIRAcetam  1,000 mg Intravenous STAT  . levETIRAcetam  500 mg Intravenous Q12H  . lisinopril  40 mg Oral Daily  . potassium chloride  10 mEq Intravenous Q1 Hr x 3   Continuous Infusions: . 0.9 % NaCl with KCl 20 mEq / L 50 mL/hr at 03/30/16 1749    Time spent: 25 minutes    Leisa Lenz, MD [facility] Pager [phone]  If 7PM-7AM, please contact night-coverage [email or web address] Password [facility] 03/31/2016, 10:29 AM

## 2016-03-31 NOTE — Progress Notes (Signed)
Subjective: Much improved  Exam: Filed Vitals:   03/31/16 0201 03/31/16 0949  BP: 150/98 139/94  Pulse: 66 54  Temp: 98.4 F (36.9 C) 98.5 F (36.9 C)  Resp: 16 18   Gen: In bed, NAD Resp: non-labored breathing, no acute distress Abd: soft, nt  Neuro: MS: awake, talking, not oriented.  [id number] and tracks.  Motor: MAE to command  Pertinent Labs: Bmp - unremarkable  Impression: 75 yo F with new onset seizures in the setting of dementia. She initially was post-ictal, now much improved. With her improvement, I do not think that MRI and EEG are needed unless family reports that she is still not back to baseline.   Recommendations: 1) continue keppra 500mg  BID 2) No further recommendations at this time, please call with further questions or concerns.   Roland Rack, MD [facility] [phone]  If 7pm- 7am, please page neurology on call as listed in [place].

## 2016-04-01 LAB — BASIC METABOLIC PANEL
Anion gap: 9 (ref 5–15)
BUN: 7 mg/dL (ref 6–20)
CALCIUM: 8.7 mg/dL — AB (ref 8.9–10.3)
CO2: 27 mmol/L (ref 22–32)
CREATININE: 0.84 mg/dL (ref 0.44–1.00)
Chloride: 105 mmol/L (ref 101–111)
GLUCOSE: 87 mg/dL (ref 65–99)
Potassium: 3.3 mmol/L — ABNORMAL LOW (ref 3.5–5.1)
Sodium: 141 mmol/L (ref 135–145)

## 2016-04-01 LAB — CBC
HCT: 38.3 % (ref 36.0–46.0)
Hemoglobin: 12.4 g/dL (ref 12.0–15.0)
MCH: 29 pg (ref 26.0–34.0)
MCHC: 32.4 g/dL (ref 30.0–36.0)
MCV: 89.7 fL (ref 78.0–100.0)
PLATELETS: 174 10*3/uL (ref 150–400)
RBC: 4.27 MIL/uL (ref 3.87–5.11)
RDW: 13.4 % (ref 11.5–15.5)
WBC: 5 10*3/uL (ref 4.0–10.5)

## 2016-04-01 LAB — MAGNESIUM: Magnesium: 1.6 mg/dL — ABNORMAL LOW (ref 1.7–2.4)

## 2016-04-01 MED ORDER — POTASSIUM CHLORIDE 10 MEQ/100ML IV SOLN
10.0000 meq | INTRAVENOUS | Status: AC
  Administered 2016-04-01 (×2): 10 meq via INTRAVENOUS
  Filled 2016-04-01 (×2): qty 100

## 2016-04-01 MED ORDER — POTASSIUM CHLORIDE CRYS ER 20 MEQ PO TBCR
40.0000 meq | EXTENDED_RELEASE_TABLET | Freq: Once | ORAL | Status: AC
  Administered 2016-04-01: 40 meq via ORAL
  Filled 2016-04-01: qty 2

## 2016-04-01 MED ORDER — SODIUM CHLORIDE 0.9% FLUSH
10.0000 mL | INTRAVENOUS | Status: DC | PRN
  Administered 2016-04-03: 10 mL
  Filled 2016-04-01: qty 40

## 2016-04-01 NOTE — Progress Notes (Signed)
Patient ID: Diane Frey, female   DOB: 08-24-1941, 75 y.o.   MRN: [id number]  PROGRESS NOTE    Diane Frey  [id number] DOB: [birth year] DOA: 03/30/2016  PCP: Hoyt Koch, MD  Outpatient Specialists: none   Brief Narrative:  75 y.o. female with a past medical history includes dementia, CAD, sleep apnea, hypertension who presented to [facility] ED with altered mental status. Patient's family brought the patient for evaluation because patient did not respond to questions or simple commands. The symptoms started one day prior to the admission. The patient's family noticed also that the patient had bilateral arm and leg jerking which lasted about 5 minutes.  On arrival to ED, stroke code was called and then discontinued. Patient was hemodynamically stable in ED. Blood work was notable for potassium of 2.9 which was supplemented. CT head showed no acute intracranial findings. Patient does have chronic ventriculomegaly, periventricular white matter disease and left frontal lobe encephalomalacia. Neurology has seen the patient in consultation.   Assessment & Plan:   Principal Problem:   Acute metabolic encephalopathy / Seizures / Dementia  - No acute intracranial findings on CT scan. Patient does however have chronic ventriculomegaly and periventricular white matter changes as well as left frontal lobe encephalomalacia as noted on CT scan - Continue Keppra 500 mg IV every 12 hours - Per neurology, no need for EEG or MRI at this time - Per physical therapy recommendation is for skilled nursing facility placement  Active Problems:   Obstructive sleep apnea - Stable     Essential hypertension - Using hydralazine as needed for blood pressure control    Hypokalemia - Likely from Lasix which patient takes at home - Potassium supplemented - We'll check potassium tomorrow morning and will check magnesium level.   DVT prophylaxis: Lovenox subcutaneous Code Status: full code    Family Communication: Family not at the bedside, spoke with the family over the phone yesterday and today Disposition Plan: Plan for skilled nursing facility placement likely by 04/03/2016.   Consultants:   Neurology   Physical therapy  Procedures:   None   Antimicrobials:   None    Subjective: Awake but disoriented unable to follow simple commands.  Objective: Filed Vitals:   03/31/16 2127 04/01/16 0221 04/01/16 0602 04/01/16 0943  BP: 142/75 177/89 146/76 140/63  Pulse: 55 58 56 60  Temp: 98.3 F (36.8 C) 98.3 F (36.8 C) 98.3 F (36.8 C) 98.6 F (37 C)  TempSrc: Oral Oral Oral Oral  Resp: 16 16 16 16   Height:      Weight:      SpO2: 100% 100% 98% 100%    Intake/Output Summary (Last 24 hours) at 04/01/16 1024 Last data filed at 03/31/16 1700  Gross per 24 hour  Intake    240 ml  Output      0 ml  Net    240 ml   Filed Weights   03/30/16 1200 03/30/16 1234  Weight: 96.5 kg (212 lb 11.9 oz) 95.6 kg (210 lb 12.2 oz)    Examination:  General exam: Appears calm, comfortable  Respiratory system: No wheezing, no rhonchi Cardiovascular system: S1 & S2 appreciated, rate controlled Gastrointestinal system: Appreciate bowel sounds, nontender and nondistended abdomen Central nervous system: Disoriented. Nonfocal. Extremities: Pulses palpable, no edema Skin: No rashes, skin warm and dry Psychiatry: Unable to assess due to altered mental status   Data Reviewed: I have personally reviewed following labs and imaging studies  CBC:  Recent Labs  Lab 03/30/16 1225 03/30/16 1232 04/01/16 0541  WBC 4.0  --  5.0  NEUTROABS 2.0  --   --   HGB 13.6 13.9 12.4  HCT 41.2 41.0 38.3  MCV 89.8  --  89.7  PLT 150  --  [id number]   Basic Metabolic Panel:  Recent Labs Lab 03/30/16 1225 03/30/16 1232 03/30/16 1713 03/31/16 0536 04/01/16 0541  NA 142 140  --  142 141  K 2.9* 3.0*  --  3.3* 3.3*  CL 104 102  --  100* 105  CO2 24  --   --  29 27  GLUCOSE 124* 121*   --  82 87  BUN 6 5*  --  <5* 7  CREATININE 0.95 0.70  --  0.91 0.84  CALCIUM 9.1  --   --  8.6* 8.7*  MG  --   --  1.7  --  1.6*   GFR: Estimated Creatinine Clearance: 64.3 mL/min (by C-G formula based on Cr of 0.84). Liver Function Tests:  Recent Labs Lab 03/30/16 1225  AST 21  ALT 13*  ALKPHOS 63  BILITOT 0.8  PROT 6.5  ALBUMIN 3.5   No results for input(s): LIPASE, AMYLASE in the last 168 hours. No results for input(s): AMMONIA in the last 168 hours. Coagulation Profile:  Recent Labs Lab 03/30/16 1225  INR 1.16   Cardiac Enzymes: No results for input(s): CKTOTAL, CKMB, CKMBINDEX, TROPONINI in the last 168 hours. BNP (last 3 results) No results for input(s): PROBNP in the last 8760 hours. HbA1C: No results for input(s): HGBA1C in the last 72 hours. CBG:  Recent Labs Lab 03/30/16 1237  GLUCAP 99   Lipid Profile: No results for input(s): CHOL, HDL, LDLCALC, TRIG, CHOLHDL, LDLDIRECT in the last 72 hours. Thyroid Function Tests:  Recent Labs  03/30/16 1717  TSH 3.033   Anemia Panel: No results for input(s): VITAMINB12, FOLATE, FERRITIN, TIBC, IRON, RETICCTPCT in the last 72 hours. Urine analysis:    Component Value Date/Time   COLORURINE STRAW* 03/30/2016 1407   APPEARANCEUR CLEAR 03/30/2016 1407   LABSPEC 1.006 03/30/2016 1407   PHURINE 7.0 03/30/2016 1407   GLUCOSEU NEGATIVE 03/30/2016 1407   GLUCOSEU NEGATIVE 06/03/2015 1344   HGBUR LARGE* 03/30/2016 1407   BILIRUBINUR NEGATIVE 03/30/2016 1407   BILIRUBINUR neg 05/17/2015 1134   KETONESUR NEGATIVE 03/30/2016 1407   PROTEINUR NEGATIVE 03/30/2016 1407   PROTEINUR neg 05/17/2015 1134   UROBILINOGEN 0.2 09/16/2015 1508   UROBILINOGEN 0.2 05/17/2015 1134   NITRITE NEGATIVE 03/30/2016 1407   NITRITE neg 05/17/2015 1134   LEUKOCYTESUR NEGATIVE 03/30/2016 1407   Sepsis Labs: @LABRCNTIP (procalcitonin:4,lacticidven:4)  No results found for this or any previous visit (from the past 240 hour(s)).    Radiology Studies: Ct Head Wo Contrast 03/30/2016  1. No CT evidence of acute stroke or hemorrhage. 2. Chronic ventriculomegaly, periventricular white matter disease and left frontal lobe encephalomalacia.   Scheduled Meds: . enoxaparin (LOVENOX)   40 mg Subcutaneous Q24H  . levETIRAcetam  1,000 mg Intravenous STAT  . levETIRAcetam  500 mg Intravenous Q12H  . lisinopril  40 mg Oral Daily  . potassium chloride  10 mEq Intravenous Q1 Hr x 3   Continuous Infusions: . 0.9 % NaCl with KCl 20 mEq / L 10 mL/hr at 04/01/16 0941    Time spent: 25 minutes    Leisa Lenz, MD [facility] Pager [phone]  If 7PM-7AM, please contact night-coverage [email or web address] Password [facility] 04/01/2016, 10:24 AM

## 2016-04-01 NOTE — NC FL2 (Signed)
[place] LEVEL OF CARE SCREENING TOOL     IDENTIFICATION  Patient Name: Diane Frey Birthdate: 1941/10/03 Sex: female Admission Date (Current Location): 03/30/2016  [facility] and [place] Number:  Herbalist and Address:  [facility] [place]. [facility], [place] [address], [place], [place] [address]      Provider Number: [id number]  Attending Physician Name and Address:  Robbie Lis, MD  Relative Name and Phone Number:       Current Level of Care: Hospital Recommended Level of Care: [place] Prior Approval Number:    Date Approved/Denied:   PASRR Number: [id number]  Discharge Plan: SNF    Current Diagnoses: Patient Active Problem List   Diagnosis Date Noted  . Seizures ([place])   . Acute encephalopathy 03/30/2016  . Seizure ([place]) 03/30/2016  . Hypokalemia 03/30/2016  . Hematuria 03/30/2016  . Neck strain 01/17/2016  . Headache 01/06/2016  . Essential hypertension 05/06/2015  . Postmenopausal bleeding 07/05/2013  . STREP INF CCE & UNS SITE GROUP D [ENTEROCOCCUS] 09/29/2010  . ENDOCARDITIS 09/29/2010  . Dementia 01/20/2010  . 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 OBESITY 01/16/2009  . CVA 02/10/2008  . Obstructive sleep apnea 05/27/2007  . HYPERLIPIDEMIA 02/14/2007  . MYOCARDIAL INFARCTION, HX OF 02/14/2007  . Coronary atherosclerosis 02/14/2007    Orientation RESPIRATION BLADDER Height & Weight     Self  Normal Incontinent Weight: 210 lb 12.2 oz (95.6 kg) Height:  5\' 2"  (157.5 cm)  BEHAVIORAL SYMPTOMS/MOOD NEUROLOGICAL BOWEL NUTRITION STATUS      Incontinent Diet (Cardiac diet)  AMBULATORY STATUS COMMUNICATION OF NEEDS Skin   Extensive Assist Verbally Normal                       Personal Care Assistance Level of Assistance  Dressing, Bathing Bathing Assistance: Maximum assistance Feeding assistance: Independent Dressing Assistance: Maximum assistance     Functional Limitations Info              SPECIAL CARE FACTORS FREQUENCY  PT (By licensed PT), OT (By licensed OT)     PT Frequency: daily OT Frequency: daily            Contractures Contractures Info: Not present    Additional Factors Info                  Current Medications (04/01/2016):  This is the current hospital active medication list Current Facility-Administered Medications  Medication Dose Route Frequency Provider Last Rate Last Dose  . 0.9 % NaCl with KCl 20 mEq/ L  infusion   Intravenous Continuous Robbie Lis, MD 10 mL/hr at 04/01/16 0941    . acetaminophen (TYLENOL) tablet 650 mg  650 mg Oral Q6H PRN Radene Gunning, NP       Or  . acetaminophen (TYLENOL) suppository 650 mg  650 mg Rectal Q6H PRN Radene Gunning, NP      . dorzolamide-timolol (COSOPT) 22.3-6.8 MG/ML ophthalmic solution 1 drop  1 drop Both Eyes BID Radene Gunning, NP   1 drop at 04/01/16 [id number]  . enoxaparin (LOVENOX) injection 40 mg  40 mg Subcutaneous Q24H Radene Gunning, NP   40 mg at 03/31/16 1608  . hydrALAZINE (APRESOLINE) injection 5 mg  5 mg Intravenous Q8H PRN Robbie Lis, MD      . latanoprost (XALATAN) 0.005 % ophthalmic solution 1 drop  1 drop Both Eyes QHS Radene Gunning, NP  1 drop at 03/31/16 2201  . levETIRAcetam (KEPPRA) 500 mg in sodium chloride 0.9 % 100 mL IVPB  500 mg Intravenous Q12H Marliss Coots, PA-C   500 mg at 04/01/16 [id number]  . morphine 2 MG/ML injection 1 mg  1 mg Intravenous Q2H PRN Radene Gunning, NP      . ondansetron [facility]) tablet 4 mg  4 mg Oral Q6H PRN Radene Gunning, NP       Or  . ondansetron [facility]) injection 4 mg  4 mg Intravenous Q6H PRN Lezlie Octave Black, NP      . potassium chloride 10 mEq in 100 mL IVPB  10 mEq Intravenous Q1 Hr x 3 Robbie Lis, MD      . sodium chloride flush (NS) 0.9 % injection 3 mL  3 mL Intravenous Q12H Radene Gunning, NP   3 mL at 04/01/16 1000     Discharge Medications: Please see discharge summary for a list of discharge medications.  Relevant Imaging  Results:  Relevant Lab Results:   Additional Information SSN [id number]  Dulcy Fanny, LCSW

## 2016-04-01 NOTE — Clinical Social Work Note (Cosign Needed)
CSW received consult for SNF placement.  Patient is alert though only oriented to self at this time.  CSW left message for patient's daughter, Lenna Sciara.  CSW currently awaiting a return call.  Nonnie Done, LCSW [phone]  2H 1-14; [place] Licensed Clinical Social Worker

## 2016-04-01 NOTE — Clinical Social Work Placement (Signed)
   CLINICAL SOCIAL WORK PLACEMENT  NOTE  Date:  04/01/2016  Patient Details  Name: Diane Frey MRN: [id number] Date of Birth: 1941/07/14  Clinical Social Work is seeking post-discharge placement for this patient at the [place] level of care (*CSW will initial, date and re-position this form in  chart as items are completed):  Yes   Patient/family provided with [place] Work Department's list of facilities offering this level of care within the geographic area requested by the patient (or if unable, by the patient's family).  Yes   Patient/family informed of their freedom to choose among providers that offer the needed level of care, that participate in Medicare, Medicaid or managed care program needed by the patient, have an available bed and are willing to accept the patient.  Yes   Patient/family informed of [place]'s ownership interest in [facility] and [facility], as well as of the fact that they are under no obligation to receive care at these facilities.  PASRR submitted to EDS on       PASRR number received on       Existing PASRR number confirmed on 04/01/16     FL2 transmitted to all facilities in geographic area requested by pt/family on 04/01/16     FL2 transmitted to all facilities within larger geographic area on       Patient informed that his/her managed care company has contracts with or will negotiate with certain facilities, including the following:            Patient/family informed of bed offers received.  Patient chooses bed at       Physician recommends and patient chooses bed at      Patient to be transferred to   on  .  Patient to be transferred to facility by       Patient family notified on   of transfer.  Name of family member notified:        PHYSICIAN Please sign FL2     Additional Comment:    _______________________________________________ Dulcy Fanny, LCSW 04/01/2016, 12:10  PM

## 2016-04-01 NOTE — Care Management Note (Signed)
Case Management Note  Patient Details  Name: Diane Frey MRN: [id number] Date of Birth: [birth year]  Subjective/Objective:     Pt admitted with acute encephalopathy. She is from home with her daughter.                Action/Plan: PT rec is for SNF. CM following for discharge needs.   Expected Discharge Date:                  Expected Discharge Plan:  [place]  In-House Referral:     Discharge planning Services     Post Acute Care Choice:    Choice offered to:     DME Arranged:    DME Agency:     HH Arranged:    [place] Agency:     Status of Service:  In process, will continue to follow  Medicare Important Message Given:    Date Medicare IM Given:    Medicare IM give by:    Date Additional Medicare IM Given:    Additional Medicare Important Message give by:     If discussed at [place] of Stay Meetings, dates discussed:    Additional Comments:  Pollie Friar, RN 04/01/2016, 1:02 PM

## 2016-04-01 NOTE — Clinical Social Work Note (Signed)
Clinical Social Work Assessment  Patient Details  Name: Diane Frey MRN: [id number] Date of Birth: [birth year]  Date of referral:  04/01/16               Reason for consult:  Facility Placement                Permission sought to share information with:  Facility Sport and exercise psychologist, Family Supports Permission granted to share information::  Yes, Verbal Permission Granted  Name::      (daughter, Diane Frey)  Agency::   [facility] SNFs)  Relationship::     Contact Information:     Housing/Transportation Living arrangements for the past 2 months:  [place] of Information:  Adult Children (daughter Diane Frey) Patient Interpreter Needed:  None Criminal Activity/Legal Involvement Pertinent to Current Situation/Hospitalization:  No - Comment as needed Significant Relationships:  Adult Children Lives with:    Do you feel safe going back to the place where you live?  No Need for family participation in patient care:  Yes (Comment) (patient is oriented to self only )  Care giving concerns:  Daughter states she had an appointment already to discuss placement with patient's PCP.  Patient is max assist and patient's daughter states she is no longer able to independently care for her at this time.   Social Worker assessment / plan:  CSW received consult for SNF/STR.  Patient is alert though only oriented to self at this time.  CSW contacted patient's daughter, Diane Frey to complete assessment.  [name] states prior to admission, patient resided with her.  [name] reports being patient's main caregiver and that the patient is getting less independent.  [name] states she had already spoken to patient's PCP regarding placement prior to this admission.  [name] states that patient has been to [facility] SNF in the past.  Daughter states that patient is incontinent at home.  CSW will provide bed offers once available.  Employment status:  Retired Forensic scientist:   [employer] PT Recommendations:  [place] / Referral to community resources:  [place]  Patient/Family's Response to care:  Daughter, Diane Frey, agreeable to SNF  Patient/Family's Understanding of and Emotional Response to Diagnosis, Current Treatment, and Prognosis:  Daughter very concerned regarding patient's ability to return home.  Daughter expressed emotional fatigue in caring for patient 24/7; however also expressed guilt in feeling the need to have patient placed at SNF.  CSW provided support.  Emotional Assessment Appearance:  Appears stated age Attitude/Demeanor/Rapport:  Unable to Assess Affect (typically observed):  Unable to Assess Orientation:  Oriented to Self Alcohol / Substance use:  Not Applicable Psych involvement (Current and /or in the community):  No (Comment)  Discharge Needs  Concerns to be addressed:  Discharge Planning Concerns, Care Coordination Readmission within the last 30 days:  No Current discharge risk:  None Barriers to Discharge:  Continued Medical Work up   Dulcy Fanny, LCSW 04/01/2016, 12:06 PM

## 2016-04-02 LAB — BASIC METABOLIC PANEL
ANION GAP: 8 (ref 5–15)
BUN: <5 mg/dL — ABNORMAL LOW (ref 6–20)
CALCIUM: 8.8 mg/dL — AB (ref 8.9–10.3)
CO2: 27 mmol/L (ref 22–32)
Chloride: 109 mmol/L (ref 101–111)
Creatinine, Ser: 0.8 mg/dL (ref 0.44–1.00)
GFR calc Af Amer: >60 mL/min (ref >60)
GFR calc non Af Amer: >60 mL/min (ref >60)
GLUCOSE: 94 mg/dL (ref 65–99)
Potassium: 4.5 mmol/L (ref 3.5–5.1)
Sodium: 144 mmol/L (ref 135–145)

## 2016-04-02 LAB — CBC
HCT: 37.6 % (ref 36.0–46.0)
Hemoglobin: 12.6 g/dL (ref 12.0–15.0)
MCH: 30.4 pg (ref 26.0–34.0)
MCHC: 33.5 g/dL (ref 30.0–36.0)
MCV: 90.6 fL (ref 78.0–100.0)
PLATELETS: 158 10*3/uL (ref 150–400)
RBC: 4.15 MIL/uL (ref 3.87–5.11)
RDW: 13.6 % (ref 11.5–15.5)
WBC: 4.1 10*3/uL (ref 4.0–10.5)

## 2016-04-02 LAB — MAGNESIUM: Magnesium: 1.7 mg/dL (ref 1.7–2.4)

## 2016-04-02 LAB — GLUCOSE, CAPILLARY: Glucose-Capillary: 101 mg/dL — ABNORMAL HIGH (ref 65–99)

## 2016-04-02 MED ORDER — LEVETIRACETAM 500 MG PO TABS: 500.0000 mg | ORAL_TABLET | Freq: Two times a day (BID) | ORAL | Status: DC

## 2016-04-02 MED ORDER — ACETAMINOPHEN 325 MG PO TABS: 650.0000 mg | ORAL_TABLET | Freq: Four times a day (QID) | ORAL | Status: DC | PRN

## 2016-04-02 NOTE — Progress Notes (Signed)
Physical Therapy Treatment Patient Details Name: Diane Frey MRN: [id number] DOB: Dec 19, 1940 Today's Date: 04/02/2016    History of Present Illness Diane Frey is a 75 y.o. female with a Past Medical History of CVA, HLD, HTN, MI, OSA, CAD, dementia, CAD, Dementia, GERD, Gout, who presents with acute encephalopathy secondary to seizures, HypoK, HTN, dementia at baseline.     PT Comments    Patient limited by cognitive deficits. Max A +2 for stand pivot EOB to chair. Family present for session. Pt stopped responding to therapist end of session. Current plan remains appropriate.   Follow Up Recommendations  SNF;Supervision/Assistance - 24 hour     Equipment Recommendations  None recommended by PT    Recommendations for Other Services       Precautions / Restrictions Precautions Precautions: Fall Precaution Comments: h/o dementia Restrictions Weight Bearing Restrictions: No    Mobility  Bed Mobility Overal bed mobility: Needs Assistance;+2 for physical assistance Bed Mobility: Supine to Sit     Supine to sit: Max assist;+2 for physical assistance;HOB elevated     General bed mobility comments: max multimodal cues and assistance to bring bilat LE to EOB, elevate trunk into sitting, and scoot hips to EOB with use of bedpad  Transfers Overall transfer level: Needs assistance Equipment used:  (2 person lift with gait belt and bed pad) Transfers: Stand Pivot Transfers   Stand pivot transfers: Max assist;+2 physical assistance       General transfer comment: cues for hand placement and technique with pt unable to achieve upright posture and therapist blocking knees; pt followed commands inconsistently  Ambulation/Gait             General Gait Details: pt unable at this time   Stairs            Wheelchair Mobility    Modified Rankin (Stroke Patients Only) Modified Rankin (Stroke Patients Only) Pre-Morbid Rankin Score: Moderately severe  disability Modified Rankin: Severe disability     Balance     Sitting balance-Leahy Scale: Poor Sitting balance - Comments: pt fluctuated between min guard to maxA with onset of fatigue. pt with L lateral lean with onset of fatigue requiring maxA     Standing balance-Leahy Scale: Zero                      Cognition Arousal/Alertness: Awake/alert Behavior During Therapy: Flat affect Overall Cognitive Status: History of cognitive impairments - at baseline (unable to recall the year, said it is 61)                      Exercises      General Comments General comments (skin integrity, edema, etc.): pt stopped responding to therapist's questions end of session; family present for session      Pertinent Vitals/Pain Pain Assessment: Faces Faces Pain Scale: No hurt Pain Intervention(s): Monitored during session    Home Living                      Prior Function            PT Goals (current goals can now be found in the care plan section) Acute Rehab PT Goals Patient Stated Goal: didn't state PT Goal Formulation: Patient unable to participate in goal setting Time For Goal Achievement: 04/14/16 Potential to Achieve Goals: Good Progress towards PT goals: Progressing toward goals    Frequency  Min 3X/week    PT  Plan Current plan remains appropriate    Co-evaluation             End of Session Equipment Utilized During Treatment: Gait belt Activity Tolerance: Patient tolerated treatment well;Other (comment) (limited by cognition) Patient left: in bed;with call bell/phone within reach;with nursing/sitter in room;with bed alarm set     Time: 1310-1334 PT Time Calculation (min) (ACUTE ONLY): 24 min  Charges:  $Therapeutic Activity: 23-37 mins                    G Codes:     [place] April, PTA Pager: [phone]   04/02/2016, 5:01 PM

## 2016-04-02 NOTE — Discharge Summary (Signed)
Physician Discharge Summary  Diane Frey [id number] DOB: 1941/10/30 DOA: 03/30/2016  PCP: Hoyt Koch, MD  Admit date: 03/30/2016 Discharge date: 04/02/2016  Recommendations for Outpatient Follow-up:  Continue Keppra 500 mg Q 12 hours  Discharge Diagnoses:  Principal Problem:   Acute encephalopathy Active Problems:   OBESITY   Dementia   Obstructive sleep apnea   Coronary atherosclerosis   Essential hypertension   Seizure ([place])   Hypokalemia   Hematuria   Seizures ([place])    Discharge Condition: stable   Diet recommendation: as tolerated   History of present illness:  75 y.o. female with a past medical history includes dementia, CAD, sleep apnea, hypertension who presented to [facility] ED with altered mental status. Patient's family brought the patient for evaluation because patient did not respond to questions or simple commands. The symptoms started one day prior to the admission. The patient's family noticed also that the patient had bilateral arm and leg jerking which lasted about 5 minutes.  On arrival to ED, stroke code was called and then discontinued. Patient was hemodynamically stable in ED. Blood work was notable for potassium of 2.9 which was supplemented. CT head showed no acute intracranial findings. Patient does have chronic ventriculomegaly, periventricular white matter disease and left frontal lobe encephalomalacia. Neurology has seen the patient in consultation.   Hospital Course:   Assessment & Plan:  Principal Problem:  Acute metabolic encephalopathy / Seizures / Dementia  - No acute intracranial findings on CT scan. Patient does however have chronic ventriculomegaly and periventricular white matter changes as well as left frontal lobe encephalomalacia as noted on CT scan - Continue Keppra 500 mg every 12 hours by mouth  - No further seizures - Physical therapy recommended skilled nursing facility placement, family agreeable to  this  Active Problems:  Obstructive sleep apnea - Stable respiratory status   Essential hypertension - Resume lasix and lisinopril on discharge    Hypokalemia - Likely from Lasix which patient takes at home - Potassium supplemented and within normal limits   DVT prophylaxis: Lovenox subcutaneous Code Status: full code  Family Communication: Family not at the bedside, left VM on cell phone [phone]   Consultants:   Neurology   Physical therapy  Procedures:   None  Antimicrobials:   None   Signed:  Leisa Lenz, MD  [facility] 04/02/2016, 8:21 PM  Pager #: [phone]  Time spent in minutes: more than 30 minutes   Discharge Exam: Filed Vitals:   04/02/16 1403 04/02/16 1837  BP: 139/59 172/63  Pulse: 61 63  Temp: 98 F (36.7 C) 98.3 F (36.8 C)  Resp: 18 17   Filed Vitals:   04/02/16 0506 04/02/16 0947 04/02/16 1403 04/02/16 1837  BP: 149/80 124/89 139/59 172/63  Pulse: 57 90 61 63  Temp: 98.1 F (36.7 C) 98.3 F (36.8 C) 98 F (36.7 C) 98.3 F (36.8 C)  TempSrc: Oral Oral Axillary Axillary  Resp: 20 17 18 17   Height:      Weight:      SpO2: 97% 100% 100% 100%    General: Pt is not in acute distress Cardiovascular: Regular rate and rhythm, S1/S2 +, no murmurs Respiratory: Clear to auscultation bilaterally, no wheezing, no crackles, no rhonchi Abdominal: Soft, non tender, non distended, bowel sounds +, no guarding Extremities: no edema, no cyanosis, pulses palpable bilaterally DP and PT Neuro: Grossly nonfocal  Discharge Instructions  Discharge Instructions    Call MD for:  difficulty breathing, headache or visual  disturbances    Complete by:  As directed      Call MD for:  persistant dizziness or light-headedness    Complete by:  As directed      Call MD for:  persistant nausea and vomiting    Complete by:  As directed      Call MD for:  redness, tenderness, or signs of infection (pain, swelling, redness, odor or  green/yellow discharge around incision site)    Complete by:  As directed      Diet - low sodium heart healthy    Complete by:  As directed      Discharge instructions    Complete by:  As directed   Continue Keppra 500 mg Q 12 hours     Increase activity slowly    Complete by:  As directed             Medication List    TAKE these medications        acetaminophen 325 MG tablet  Commonly known as:  TYLENOL  Take 2 tablets (650 mg total) by mouth every 6 (six) hours as needed for mild pain (or Fever >/= 101).     aspirin 81 MG chewable tablet  Commonly known as:  [employer] ASPIRIN  TAKE 1 TABLET (81 MG TOTAL) BY MOUTH DAILY.     atorvastatin 40 MG tablet  Commonly known as:  LIPITOR  Take 1 tablet (40 mg total) by mouth daily.     Calcium-Vitamin D 600-200 MG-UNIT tablet  Take 1 tablet by mouth daily.     carvedilol 25 MG tablet  Commonly known as:  COREG  TAKE 1 TABLET BY MOUTH TWICE A DAY WITH A MEAL     donepezil 10 MG tablet  Commonly known as:  ARICEPT  Take 1 tablet (10 mg total) by mouth at bedtime.     furosemide 40 MG tablet  Commonly known as:  LASIX  Take 1 tablet (40 mg total) by mouth daily.     latanoprost 0.005 % ophthalmic solution  Commonly known as:  XALATAN  Place 1 drop into both eyes at bedtime.     levETIRAcetam 500 MG tablet  Commonly known as:  KEPPRA  Take 1 tablet (500 mg total) by mouth 2 (two) times daily.     lisinopril 40 MG tablet  Commonly known as:  PRINIVIL,ZESTRIL  Take 1 tablet (40 mg total) by mouth daily.           Follow-up Information    Follow up with Hoyt Koch, MD. Schedule an appointment as soon as possible for a visit in 1 week.   Specialty:  Internal Medicine   Why:  Follow up appt after recent hospitalization   Contact information:   [place] [id number] [phone]        The results of significant diagnostics from this hospitalization (including imaging,  microbiology, ancillary and laboratory) are listed below for reference.    Significant Diagnostic Studies: Ct Head Wo Contrast  03/30/2016  CLINICAL DATA:  Aphasia.  Code stroke. EXAM: CT HEAD WITHOUT CONTRAST TECHNIQUE: Contiguous axial images were obtained from the base of the skull through the vertex without intravenous contrast. COMPARISON:  CT 09/16/2015 and 02/26/2015. FINDINGS: Brain: There is no evidence of acute intracranial hemorrhage, mass lesion, brain edema or extra-axial fluid collection. Chronic ventriculomegaly appears similar to prior studies. There is chronic periventricular white matter disease with chronic asymmetric left frontal lobe encephalomalacia. There  is no CT evidence of acute cortical infarction. Extensive intracranial vascular calcifications are noted. Bones/sinuses/visualized face: The visualized paranasal sinuses, mastoid air cells and middle ears are clear. The calvarium is intact. IMPRESSION: 1. No CT evidence of acute stroke or hemorrhage. 2. Chronic ventriculomegaly, periventricular white matter disease and left frontal lobe encephalomalacia. 3. These results were called by telephone at the time of interpretation on 03/30/2016 at 12:41 pm to Shanon Brow, [place] for Dr. Leonel Ramsay , who verbally acknowledged these results. Electronically Signed   By: Richardean Sale M.D.   On: 03/30/2016 12:51    Microbiology: No results found for this or any previous visit (from the past 240 hour(s)).   Labs: Basic Metabolic Panel:  Recent Labs Lab 03/30/16 1225 03/30/16 1232 03/30/16 1713 03/31/16 0536 04/01/16 0541 04/02/16 0625  NA 142 140  --  142 141 144  K 2.9* 3.0*  --  3.3* 3.3* 4.5  CL 104 102  --  100* 105 109  CO2 24  --   --  29 27 27   GLUCOSE 124* 121*  --  82 87 94  BUN 6 5*  --  <5* 7 <5*  CREATININE 0.95 0.70  --  0.91 0.84 0.80  CALCIUM 9.1  --   --  8.6* 8.7* 8.8*  MG  --   --  1.7  --  1.6* 1.7   Liver Function Tests:  Recent Labs Lab 03/30/16 1225  AST  21  ALT 13*  ALKPHOS 63  BILITOT 0.8  PROT 6.5  ALBUMIN 3.5   No results for input(s): LIPASE, AMYLASE in the last 168 hours. No results for input(s): AMMONIA in the last 168 hours. CBC:  Recent Labs Lab 03/30/16 1225 03/30/16 1232 04/01/16 0541 04/02/16 0753  WBC 4.0  --  5.0 4.1  NEUTROABS 2.0  --   --   --   HGB 13.6 13.9 12.4 12.6  HCT 41.2 41.0 38.3 37.6  MCV 89.8  --  89.7 90.6  PLT 150  --  174 158   Cardiac Enzymes: No results for input(s): CKTOTAL, CKMB, CKMBINDEX, TROPONINI in the last 168 hours. BNP: BNP (last 3 results) No results for input(s): BNP in the last 8760 hours.  ProBNP (last 3 results) No results for input(s): PROBNP in the last 8760 hours.  CBG:  Recent Labs Lab 03/30/16 1237 04/02/16 1628  GLUCAP 99 101*

## 2016-04-02 NOTE — Progress Notes (Signed)
Patient ID: Diane Frey, female   DOB: [birth year], 75 y.o.   MRN: [id number]  PROGRESS NOTE    Diane Frey  [id number] DOB: [birth year] DOA: 03/30/2016  PCP: Hoyt Koch, MD  Outpatient Specialists: none   Brief Narrative:  75 y.o. female with a past medical history includes dementia, CAD, sleep apnea, hypertension who presented to [facility] ED with altered mental status. Patient's family brought the patient for evaluation because patient did not respond to questions or simple commands. The symptoms started one day prior to the admission. The patient's family noticed also that the patient had bilateral arm and leg jerking which lasted about 5 minutes.  On arrival to ED, stroke code was called and then discontinued. Patient was hemodynamically stable in ED. Blood work was notable for potassium of 2.9 which was supplemented. CT head showed no acute intracranial findings. Patient does have chronic ventriculomegaly, periventricular white matter disease and left frontal lobe encephalomalacia. Neurology has seen the patient in consultation.   Assessment & Plan:   Principal Problem:   Acute metabolic encephalopathy / Seizures / Dementia  - No acute intracranial findings on CT scan. Patient does however have chronic ventriculomegaly and periventricular white matter changes as well as left frontal lobe encephalomalacia as noted on CT scan - Continue Keppra 500 mg IV every 12 hours - No further seizures - Physical therapy recommended skilled nursing facility placement, family agreeable to this  Active Problems:   Obstructive sleep apnea - Stable respiratory status    Essential hypertension - Using hydralazine as needed for blood pressure control - Blood pressure is reasonably controlled without blood pressure medications, 124/89    Hypokalemia - Likely from Lasix which patient takes at home - Potassium supplemented and now within normal limits   DVT prophylaxis: Lovenox  subcutaneous Code Status: full code  Family Communication: Family not at the bedside, left VM on cell phone [phone] Disposition Plan: Plan for skilled nursing facility placement likely by 04/03/2016.   Consultants:   Neurology   Physical therapy  Procedures:   None   Antimicrobials:   None    Subjective: No overnight events.  Objective: Filed Vitals:   04/01/16 2152 04/02/16 0107 04/02/16 0506 04/02/16 0947  BP: 158/78 152/79 149/80 124/89  Pulse: 47 55 57 90  Temp: 98.2 F (36.8 C) 98 F (36.7 C) 98.1 F (36.7 C) 98.3 F (36.8 C)  TempSrc: Oral Oral Oral Oral  Resp: 20 20 20 17   Height:      Weight:      SpO2: 94% 96% 97% 100%    Intake/Output Summary (Last 24 hours) at 04/02/16 1057 Last data filed at 04/02/16 0947  Gross per 24 hour  Intake    360 ml  Output      0 ml  Net    360 ml   Filed Weights   03/30/16 1200 03/30/16 1234  Weight: 96.5 kg (212 lb 11.9 oz) 95.6 kg (210 lb 12.2 oz)    Examination:  General exam: Appears In no distress Respiratory system: Good respiratory effort, no wheezing Cardiovascular system: S1 & S2 he rate controlled, no murmursard,  Gastrointestinal system: nondistended abdomen, nontender, Patricia bowel sounds  Central nervous system: remains disoriented, no focal deficits  Extremities: no lower extremity swelling, appreciate palpable pulses Skin: warm, dry  Psychiatry:  normal behavior, mood   Data Reviewed: I have personally reviewed following labs and imaging studies  CBC:  Recent Labs Lab 03/30/16 1225 03/30/16 1232 04/01/16 0541  04/02/16 0753  WBC 4.0  --  5.0 4.1  NEUTROABS 2.0  --   --   --   HGB 13.6 13.9 12.4 12.6  HCT 41.2 41.0 38.3 37.6  MCV 89.8  --  89.7 90.6  PLT 150  --  174 0000000   Basic Metabolic Panel:  Recent Labs Lab 03/30/16 1225 03/30/16 1232 03/30/16 1713 03/31/16 0536 04/01/16 0541 04/02/16 0625  NA 142 140  --  142 141 144  K 2.9* 3.0*  --  3.3* 3.3* 4.5  CL 104 102   --  100* 105 109  CO2 24  --   --  29 27 27   GLUCOSE 124* 121*  --  82 87 94  BUN 6 5*  --  <5* 7 <5*  CREATININE 0.95 0.70  --  0.91 0.84 0.80  CALCIUM 9.1  --   --  8.6* 8.7* 8.8*  MG  --   --  1.7  --  1.6* 1.7   GFR: Estimated Creatinine Clearance: 67.5 mL/min (by C-G formula based on Cr of 0.8). Liver Function Tests:  Recent Labs Lab 03/30/16 1225  AST 21  ALT 13*  ALKPHOS 63  BILITOT 0.8  PROT 6.5  ALBUMIN 3.5   No results for input(s): LIPASE, AMYLASE in the last 168 hours. No results for input(s): AMMONIA in the last 168 hours. Coagulation Profile:  Recent Labs Lab 03/30/16 1225  INR 1.16   Cardiac Enzymes: No results for input(s): CKTOTAL, CKMB, CKMBINDEX, TROPONINI in the last 168 hours. BNP (last 3 results) No results for input(s): PROBNP in the last 8760 hours. HbA1C: No results for input(s): HGBA1C in the last 72 hours. CBG:  Recent Labs Lab 03/30/16 1237  GLUCAP 99   Lipid Profile: No results for input(s): CHOL, HDL, LDLCALC, TRIG, CHOLHDL, LDLDIRECT in the last 72 hours. Thyroid Function Tests:  Recent Labs  03/30/16 1717  TSH 3.033   Anemia Panel: No results for input(s): VITAMINB12, FOLATE, FERRITIN, TIBC, IRON, RETICCTPCT in the last 72 hours. Urine analysis:    Component Value Date/Time   COLORURINE STRAW* 03/30/2016 1407   APPEARANCEUR CLEAR 03/30/2016 1407   LABSPEC 1.006 03/30/2016 1407   PHURINE 7.0 03/30/2016 1407   GLUCOSEU NEGATIVE 03/30/2016 1407   GLUCOSEU NEGATIVE 06/03/2015 1344   HGBUR LARGE* 03/30/2016 1407   BILIRUBINUR NEGATIVE 03/30/2016 1407   BILIRUBINUR neg 05/17/2015 1134   KETONESUR NEGATIVE 03/30/2016 1407   PROTEINUR NEGATIVE 03/30/2016 1407   PROTEINUR neg 05/17/2015 1134   UROBILINOGEN 0.2 09/16/2015 1508   UROBILINOGEN 0.2 05/17/2015 1134   NITRITE NEGATIVE 03/30/2016 1407   NITRITE neg 05/17/2015 1134   LEUKOCYTESUR NEGATIVE 03/30/2016 1407   Sepsis  Labs: @LABRCNTIP (procalcitonin:4,lacticidven:4)  No results found for this or any previous visit (from the past 240 hour(s)).   Radiology Studies: Ct Head Wo Contrast 03/30/2016  1. No CT evidence of acute stroke or hemorrhage. 2. Chronic ventriculomegaly, periventricular white matter disease and left frontal lobe encephalomalacia.   Scheduled Meds: . enoxaparin (LOVENOX)   40 mg Subcutaneous Q24H  . levETIRAcetam  1,000 mg Intravenous STAT  . levETIRAcetam  500 mg Intravenous Q12H  . lisinopril  40 mg Oral Daily  . potassium chloride  10 mEq Intravenous Q1 Hr x 3   Continuous Infusions: . 0.9 % NaCl with KCl 20 mEq / L 10 mL/hr at 04/01/16 1533    Time spent: 15 minutes    Leisa Lenz, MD [facility] Pager [phone]  If 7PM-7AM, please  contact night-coverage [email or web address] Password [facility] 04/02/2016, 10:57 AM

## 2016-04-02 NOTE — Clinical Social Work Note (Signed)
CSW spoke with patient's daughter to relay bed offers.  Patient's daughter, Lenna Sciara, states she will be at the hospital at approximately 11:30 and wishes to speak with CSW at bedside.  CSW instructed [name] to contact CSW once she arrives at the hospital to set a time for this meeting.  Daughter agreeable and confirmed daughter has CSW mobile number.  Nonnie Done, LCSW [phone]  2H 1-14; [place] Licensed Clinical Social Worker

## 2016-04-02 NOTE — Discharge Instructions (Signed)

## 2016-04-03 DIAGNOSIS — G8194 Hemiplegia, unspecified affecting left nondominant side: Secondary | ICD-10-CM | POA: Diagnosis not present

## 2016-04-03 DIAGNOSIS — I2510 Atherosclerotic heart disease of native coronary artery without angina pectoris: Secondary | ICD-10-CM | POA: Diagnosis not present

## 2016-04-03 DIAGNOSIS — R2689 Other abnormalities of gait and mobility: Secondary | ICD-10-CM | POA: Diagnosis not present

## 2016-04-03 DIAGNOSIS — G9340 Encephalopathy, unspecified: Secondary | ICD-10-CM | POA: Diagnosis not present

## 2016-04-03 DIAGNOSIS — E876 Hypokalemia: Secondary | ICD-10-CM | POA: Diagnosis not present

## 2016-04-03 DIAGNOSIS — Z8673 Personal history of transient ischemic attack (TIA), and cerebral infarction without residual deficits: Secondary | ICD-10-CM | POA: Diagnosis not present

## 2016-04-03 DIAGNOSIS — I10 Essential (primary) hypertension: Secondary | ICD-10-CM | POA: Diagnosis not present

## 2016-04-03 DIAGNOSIS — M6281 Muscle weakness (generalized): Secondary | ICD-10-CM | POA: Diagnosis not present

## 2016-04-03 DIAGNOSIS — F0390 Unspecified dementia without behavioral disturbance: Secondary | ICD-10-CM | POA: Diagnosis not present

## 2016-04-03 DIAGNOSIS — R569 Unspecified convulsions: Secondary | ICD-10-CM | POA: Diagnosis not present

## 2016-04-03 DIAGNOSIS — R488 Other symbolic dysfunctions: Secondary | ICD-10-CM | POA: Diagnosis not present

## 2016-04-03 DIAGNOSIS — E785 Hyperlipidemia, unspecified: Secondary | ICD-10-CM | POA: Diagnosis not present

## 2016-04-03 LAB — BASIC METABOLIC PANEL
ANION GAP: 10 (ref 5–15)
BUN: 9 mg/dL (ref 6–20)
CALCIUM: 9.1 mg/dL (ref 8.9–10.3)
CO2: 23 mmol/L (ref 22–32)
Chloride: 108 mmol/L (ref 101–111)
Creatinine, Ser: 0.76 mg/dL (ref 0.44–1.00)
GFR calc Af Amer: >60 mL/min (ref >60)
GFR calc non Af Amer: >60 mL/min (ref >60)
GLUCOSE: 78 mg/dL (ref 65–99)
POTASSIUM: 4.9 mmol/L (ref 3.5–5.1)
Sodium: 141 mmol/L (ref 135–145)

## 2016-04-03 NOTE — Progress Notes (Signed)
Report called in to receiving nurse, Danae Chen at [facility] skilled nursing facility.  Pt denies pain or discomfort. No noted distress. Pt resting quietly in bed. Will continue to monitor.

## 2016-04-03 NOTE — Clinical Social Work Placement (Signed)
   CLINICAL SOCIAL WORK PLACEMENT  NOTE  Date:  04/03/2016  Patient Details  Name: Diane Frey MRN: [id number] Date of Birth: 09-Nov-1941  Clinical Social Work is seeking post-discharge placement for this patient at the [place] level of care (*CSW will initial, date and re-position this form in  chart as items are completed):  Yes   Patient/family provided with [place] Work Department's list of facilities offering this level of care within the geographic area requested by the patient (or if unable, by the patient's family).  Yes   Patient/family informed of their freedom to choose among providers that offer the needed level of care, that participate in Medicare, Medicaid or managed care program needed by the patient, have an available bed and are willing to accept the patient.  Yes   Patient/family informed of [facility]'s ownership interest in [facility] and [facility], as well as of the fact that they are under no obligation to receive care at these facilities.  PASRR submitted to EDS on       PASRR number received on       Existing PASRR number confirmed on 04/01/16     FL2 transmitted to all facilities in geographic area requested by pt/family on 04/01/16     FL2 transmitted to all facilities within larger geographic area on       Patient informed that his/her managed care company has contracts with or will negotiate with certain facilities, including the following:            Patient/family informed of bed offers received.  Patient chooses bed at [facility] recommends and patient chooses bed at      Patient to be transferred to [facility] on 04/03/16.  Patient to be transferred to facility by [facility]     Patient family notified on 04/03/16 of transfer.  Name of family member notified:  [name] daughter     PHYSICIAN Please sign FL2     Additional Comment:     _______________________________________________ Dulcy Fanny, LCSW 04/03/2016, 12:40 PM

## 2016-04-03 NOTE — Clinical Social Work Note (Signed)
Patient will discharge today per MD order. Patient will discharge to [facility] RN to call report prior to transportation to: [phone] Transportation: [facility] called at 12:30pm  CSW sent discharge summary to SNF for review.  Daughter contacted re: dc plans- agreeable.  RN updated.  Nonnie Done, LCSW [phone]  5N1-9, 2S 15-16 and Psychiatric Service Line  Licensed Clinical Social Worker

## 2016-04-03 NOTE — Progress Notes (Signed)
Patient seen and examined at the bedside. Please refer to discharge summary completed 04/02/2016. No changes in medical management since 04/02/2016. Patient is medically stable for discharge to skilled nursing facility today.  Leisa Lenz [facility] [phone]

## 2016-04-03 NOTE — Progress Notes (Signed)
Pt discharged at this time via [facility] transport to [facility] facility taking all personal belongings. Midline discontinued, dry dressing applied to right upper arm. Pt denied pain or discomfort. No noted distress. Report called in to nurse prior to transfer.

## 2016-04-06 ENCOUNTER — Non-Acute Institutional Stay (SKILLED_NURSING_FACILITY): Payer: Medicare Other | Admitting: Internal Medicine

## 2016-04-06 ENCOUNTER — Encounter: Payer: Self-pay | Admitting: Internal Medicine

## 2016-04-06 DIAGNOSIS — E785 Hyperlipidemia, unspecified: Secondary | ICD-10-CM | POA: Diagnosis not present

## 2016-04-06 DIAGNOSIS — I10 Essential (primary) hypertension: Secondary | ICD-10-CM | POA: Diagnosis not present

## 2016-04-06 DIAGNOSIS — E876 Hypokalemia: Secondary | ICD-10-CM | POA: Diagnosis not present

## 2016-04-06 DIAGNOSIS — R569 Unspecified convulsions: Secondary | ICD-10-CM | POA: Diagnosis not present

## 2016-04-06 DIAGNOSIS — Z8673 Personal history of transient ischemic attack (TIA), and cerebral infarction without residual deficits: Secondary | ICD-10-CM

## 2016-04-06 DIAGNOSIS — F0390 Unspecified dementia without behavioral disturbance: Secondary | ICD-10-CM | POA: Diagnosis not present

## 2016-04-06 NOTE — Progress Notes (Signed)
Patient ID: Diane Frey, female   DOB: Sep 22, 1941, 75 y.o.   MRN: [id number]    HISTORY AND PHYSICAL   DATE: 04/06/16  Location:  [facility] Room Number: 563 Place of Service: SNF (31)   Extended Emergency Contact Information Primary Emergency Contact: Gharbi,[name] Address: [place], [place] Phone: [phone] Work Phone: [phone] Mobile Phone: [phone] Relation: Daughter  Advanced Directive information Does patient have an advance directive?: No, Would patient like information on creating an advanced directive?: No - patient declined information FULL CODE Chief Complaint  Patient presents with  . New Admit To SNF    HPI:  75 yo female seen today as a new admission into SNF following hospital stay for acute encephalopathy, dementia, HTN, seizure d/o, OSA, hypokalemia, hx CVA and brain aneurysms. Her CT head was neg for acute process but did show chronic ventriculomegaly/periventricular white matter disease/left frontal lobe encephalomalacia. K+ 2.9 and repleted to 4.9. She presents to SNF for short term rehab with potential for long term care.   Pt's daughter present and is c/a meds. She was previously on namenda xr before hospital admission and currently not on med. No other concerns. Pt has no c/o. No falls. No seizures witnessed. She has left sided weakness. She is a poor historian due to dementia. Hx obtained from chart  Hx CVA/brain aneurysms - stable on ASA and lipitor  HTN/CAD with hx MI - BP stable on lisinopril, lasix and coreg. She takes ASA daily and lipitor  Hyperlipidemia - stable on lipitor. LDL 93. No myalgias  Seizures - stable on keppra  Dementia - takes aricept 42m qhs and previously on namenda XR 236mdaily  Glaucoma - uses xalatan eye gtts to OU     Past Medical History  Diagnosis Date  . CVA 02/10/2008  . ENDOCARDITIS 09/29/2010  . HYPERLIPIDEMIA  02/14/2007  . HYPERTENSION 02/14/2007  . MYOCARDIAL INFARCTION, HX OF 02/14/2007  . OBESITY 01/16/2009  . OBSTRUCTIVE SLEEP APNEA 05/27/2007  . 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 CORONARY ARTERY DISEASE 02/14/2007  . DEMENTIA 01/20/2010  . CAD (coronary artery disease)   . Myocardial infarct ([facility]  . Edema   . Alzheimer disease   . GERD (gastroesophageal reflux disease)   . Glaucoma   . Gout   . Unspecified urinary incontinence   . Postmenopausal bleeding   . Morbid obesity ([facility]  . Lack of coordination   . Aneurysm ([place]    Head    Past Surgical History  Procedure Laterality Date  . Tubal ligation      Patient Care Team: ElHoyt KochMD as PCP - General (Internal Medicine)  Social History   Social History  . Marital Status: Widowed    Spouse Name: N/A  . Number of Children: N/A  . Years of Education: N/A   Occupational History  . Not on file.   Social History Main Topics  . Smoking status: Never Smoker   . Smokeless tobacco: Never Used  . Alcohol Use: No  . Drug Use: No  . Sexual Activity: No   Other Topics Concern  . Not on file   Social History Narrative     reports that she has never smoked. She has never used smokeless tobacco. She reports that she does not drink alcohol or use illicit drugs.  Family History  Problem Relation Age of  Onset  . Heart failure Mother     Deceased  . Kidney disease Brother     Deceased  . Heart disease Father     Deceased  . Cancer Mother   . Stroke Sister   . Hypertension Brother   . Hypertension Sister   . Heart disease Sister     Deceased  . Hypertension Sister     x2  . Hypertension Son     x2  . Glaucoma Son    Family Status  Relation Status Death Age  . Mother Deceased   . Father Deceased 69    unknown cause    Immunization History  Administered Date(s) Administered  . Influenza Whole 09/23/2007, 09/10/2008, 09/17/2009  . Pneumococcal Conjugate-13 05/17/2015  . Pneumococcal  Polysaccharide-23 04/07/2007    No Known Allergies  Medications: Patient's Medications  New Prescriptions   No medications on file  Previous Medications   ACETAMINOPHEN (TYLENOL) 325 MG TABLET    Take 2 tablets (650 mg total) by mouth every 6 (six) hours as needed for mild pain (or Fever >/= 101).   ASPIRIN ([employer] CHILDRENS ASPIRIN) 81 MG CHEWABLE TABLET    TAKE 1 TABLET (81 MG TOTAL) BY MOUTH DAILY.   ATORVASTATIN (LIPITOR) 40 MG TABLET    Take 1 tablet (40 mg total) by mouth daily.   CALCIUM-VITAMIN D 600-200 MG-UNIT TABLET    Take 1 tablet by mouth daily.   CARVEDILOL (COREG) 25 MG TABLET    TAKE 1 TABLET BY MOUTH TWICE A DAY WITH A MEAL   DONEPEZIL (ARICEPT) 10 MG TABLET    Take 1 tablet (10 mg total) by mouth at bedtime.   FUROSEMIDE (LASIX) 40 MG TABLET    Take 1 tablet (40 mg total) by mouth daily.   LATANOPROST (XALATAN) 0.005 % OPHTHALMIC SOLUTION    Place 1 drop into both eyes at bedtime.   LEVETIRACETAM (KEPPRA) 500 MG TABLET    Take 1 tablet (500 mg total) by mouth 2 (two) times daily.   LISINOPRIL (PRINIVIL,ZESTRIL) 40 MG TABLET    Take 1 tablet (40 mg total) by mouth daily.  Modified Medications   No medications on file  Discontinued Medications   No medications on file    Review of Systems  Unable to perform ROS: Dementia    Filed Vitals:   04/06/16 1111  BP: 130/84  Pulse: 55  Temp: 96.7 F (35.9 C)  TempSrc: Oral  Resp: 16  Height: '5\' 2"'$  (1.575 m)  Weight: 210 lb 11.2 oz (95.573 kg)   Body mass index is 38.53 kg/(m^2).  Physical Exam  Constitutional: She appears well-developed and well-nourished.  Sitting in w/c in NAD  HENT:  Mouth/Throat: Oropharynx is clear and moist. No oropharyngeal exudate.  Eyes: Pupils are equal, round, and reactive to light. No scleral icterus.  Neck: Neck supple. Carotid bruit is not present. No tracheal deviation present.  Cardiovascular: Normal rate, regular rhythm, normal heart sounds and intact distal pulses.  Exam  reveals no gallop and no friction rub.   No murmur heard. Trace LLE edema. No RLE edema. No calf TTP  Pulmonary/Chest: Effort normal and breath sounds normal. No stridor. No respiratory distress. She has no wheezes. She has no rales.  Abdominal: Soft. Bowel sounds are normal. She exhibits no distension and no mass. There is no hepatomegaly. There is no tenderness. There is no rebound and no guarding.  Musculoskeletal: She exhibits edema and tenderness.  LUE swelling with trace LLE swelling  Lymphadenopathy:    She has no cervical adenopathy.  Neurological: She is alert.  Reduced left grip strength; left hemiparesis  Skin: Skin is warm and dry. No rash noted.  Psychiatric: She has a normal mood and affect. Her behavior is normal. Thought content normal.     Labs reviewed: Admission on 03/30/2016, Discharged on 04/03/2016  Component Date Value Ref Range Status  . Sodium 03/30/2016 140  135 - 145 mmol/L Final  . Potassium 03/30/2016 3.0* 3.5 - 5.1 mmol/L Final  . Chloride 03/30/2016 102  101 - 111 mmol/L Final  . BUN 03/30/2016 5* 6 - 20 mg/dL Final  . Creatinine, Ser 03/30/2016 0.70  0.44 - 1.00 mg/dL Final  . Glucose, Bld 03/30/2016 121* 65 - 99 mg/dL Final  . Calcium, Ion 03/30/2016 1.01* 1.13 - 1.30 mmol/L Final  . TCO2 03/30/2016 22  0 - 100 mmol/L Final  . Hemoglobin 03/30/2016 13.9  12.0 - 15.0 g/dL Final  . HCT 03/30/2016 41.0  36.0 - 46.0 % Final  . Alcohol, Ethyl (B) 03/30/2016 <5  <5 mg/dL Final   Comment:        LOWEST DETECTABLE LIMIT FOR SERUM ALCOHOL IS 5 mg/dL FOR MEDICAL PURPOSES ONLY   . Prothrombin Time 03/30/2016 15.0  11.6 - 15.2 seconds Final  . INR 03/30/2016 1.16  0.00 - 1.49 Final  . aPTT 03/30/2016 29  24 - 37 seconds Final  . WBC 03/30/2016 4.0  4.0 - 10.5 K/uL Final  . RBC 03/30/2016 4.59  3.87 - 5.11 MIL/uL Final  . Hemoglobin 03/30/2016 13.6  12.0 - 15.0 g/dL Final  . HCT 03/30/2016 41.2  36.0 - 46.0 % Final  . MCV 03/30/2016 89.8  78.0 - 100.0 fL  Final  . MCH 03/30/2016 29.6  26.0 - 34.0 pg Final  . MCHC 03/30/2016 33.0  30.0 - 36.0 g/dL Final  . RDW 03/30/2016 13.4  11.5 - 15.5 % Final  . Platelets 03/30/2016 150  150 - 400 K/uL Final  . Neutrophils Relative % 03/30/2016 51   Final  . Neutro Abs 03/30/2016 2.0  1.7 - 7.7 K/uL Final  . Lymphocytes Relative 03/30/2016 37   Final  . Lymphs Abs 03/30/2016 1.5  0.7 - 4.0 K/uL Final  . Monocytes Relative 03/30/2016 7   Final  . Monocytes Absolute 03/30/2016 0.3  0.1 - 1.0 K/uL Final  . Eosinophils Relative 03/30/2016 4   Final  . Eosinophils Absolute 03/30/2016 0.1  0.0 - 0.7 K/uL Final  . Basophils Relative 03/30/2016 1   Final  . Basophils Absolute 03/30/2016 0.0  0.0 - 0.1 K/uL Final  . Sodium 03/30/2016 142  135 - 145 mmol/L Final  . Potassium 03/30/2016 2.9* 3.5 - 5.1 mmol/L Final  . Chloride 03/30/2016 104  101 - 111 mmol/L Final  . CO2 03/30/2016 24  22 - 32 mmol/L Final  . Glucose, Bld 03/30/2016 124* 65 - 99 mg/dL Final  . BUN 03/30/2016 6  6 - 20 mg/dL Final  . Creatinine, Ser 03/30/2016 0.95  0.44 - 1.00 mg/dL Final  . Calcium 03/30/2016 9.1  8.9 - 10.3 mg/dL Final  . Total Protein 03/30/2016 6.5  6.5 - 8.1 g/dL Final  . Albumin 03/30/2016 3.5  3.5 - 5.0 g/dL Final  . AST 03/30/2016 21  15 - 41 U/L Final  . ALT 03/30/2016 13* 14 - 54 U/L Final  . Alkaline Phosphatase 03/30/2016 63  38 - 126 U/L Final  . Total Bilirubin 03/30/2016 0.8  0.3 - 1.2 mg/dL Final  . GFR calc non Af Amer 03/30/2016 58* >60 mL/min Final  . GFR calc Af Amer 03/30/2016 >60  >60 mL/min Final   Comment: (NOTE) The eGFR has been calculated using the CKD EPI equation. This calculation has not been validated in all clinical situations. eGFR's persistently <60 mL/min signify possible Chronic Kidney Disease.   . Anion gap 03/30/2016 14  5 - 15 Final  . Troponin i, poc 03/30/2016 0.01  0.00 - 0.08 ng/mL Final  . Comment 3 03/30/2016          Final   Comment: Due to the release kinetics of cTnI, a  negative result within the first hours of the onset of symptoms does not rule out myocardial infarction with certainty. If myocardial infarction is still suspected, repeat the test at appropriate intervals.   . Opiates 03/30/2016 NONE DETECTED  NONE DETECTED Final  . Cocaine 03/30/2016 NONE DETECTED  NONE DETECTED Final  . Benzodiazepines 03/30/2016 NONE DETECTED  NONE DETECTED Final  . Amphetamines 03/30/2016 NONE DETECTED  NONE DETECTED Final  . Tetrahydrocannabinol 03/30/2016 NONE DETECTED  NONE DETECTED Final  . Barbiturates 03/30/2016 NONE DETECTED  NONE DETECTED Final   Comment:        DRUG SCREEN FOR MEDICAL PURPOSES ONLY.  IF CONFIRMATION IS NEEDED FOR ANY PURPOSE, NOTIFY LAB WITHIN 5 DAYS.        LOWEST DETECTABLE LIMITS FOR URINE DRUG SCREEN Drug Class       Cutoff (ng/mL) Amphetamine      1000 Barbiturate      200 Benzodiazepine   983 Tricyclics       382 Opiates          300 Cocaine          300 THC              50   . Color, Urine 03/30/2016 STRAW* YELLOW Final  . APPearance 03/30/2016 CLEAR  CLEAR Final  . Specific Gravity, Urine 03/30/2016 1.006  1.005 - 1.030 Final  . pH 03/30/2016 7.0  5.0 - 8.0 Final  . Glucose, UA 03/30/2016 NEGATIVE  NEGATIVE mg/dL Final  . Hgb urine dipstick 03/30/2016 LARGE* NEGATIVE Final  . Bilirubin Urine 03/30/2016 NEGATIVE  NEGATIVE Final  . Ketones, ur 03/30/2016 NEGATIVE  NEGATIVE mg/dL Final  . Protein, ur 03/30/2016 NEGATIVE  NEGATIVE mg/dL Final  . Nitrite 03/30/2016 NEGATIVE  NEGATIVE Final  . Leukocytes, UA 03/30/2016 NEGATIVE  NEGATIVE Final  . Glucose-Capillary 03/30/2016 99  65 - 99 mg/dL Final  . Squamous Epithelial / LPF 03/30/2016 0-5* NONE SEEN Final  . WBC, UA 03/30/2016 0-5  0 - 5 WBC/hpf Final  . RBC / HPF 03/30/2016 TOO NUMEROUS TO COUNT  0 - 5 RBC/hpf Final  . Bacteria, UA 03/30/2016 RARE* NONE SEEN Final  . Magnesium 03/30/2016 1.7  1.7 - 2.4 mg/dL Final  . TSH 03/30/2016 3.033  0.350 - 4.500 uIU/mL Final    . Sodium 03/31/2016 142  135 - 145 mmol/L Final  . Potassium 03/31/2016 3.3* 3.5 - 5.1 mmol/L Final  . Chloride 03/31/2016 100* 101 - 111 mmol/L Final  . CO2 03/31/2016 29  22 - 32 mmol/L Final  . Glucose, Bld 03/31/2016 82  65 - 99 mg/dL Final  . BUN 03/31/2016 <5* 6 - 20 mg/dL Final  . Creatinine, Ser 03/31/2016 0.91  0.44 - 1.00 mg/dL Final  . Calcium 03/31/2016 8.6* 8.9 - 10.3 mg/dL Final  . GFR calc non  Af Amer 03/31/2016 >60  >60 mL/min Final  . GFR calc Af Amer 03/31/2016 >60  >60 mL/min Final   Comment: (NOTE) The eGFR has been calculated using the CKD EPI equation. This calculation has not been validated in all clinical situations. eGFR's persistently <60 mL/min signify possible Chronic Kidney Disease.   . Anion gap 03/31/2016 13  5 - 15 Final  . Sodium 04/01/2016 141  135 - 145 mmol/L Final  . Potassium 04/01/2016 3.3* 3.5 - 5.1 mmol/L Final  . Chloride 04/01/2016 105  101 - 111 mmol/L Final  . CO2 04/01/2016 27  22 - 32 mmol/L Final  . Glucose, Bld 04/01/2016 87  65 - 99 mg/dL Final  . BUN 04/01/2016 7  6 - 20 mg/dL Final  . Creatinine, Ser 04/01/2016 0.84  0.44 - 1.00 mg/dL Final  . Calcium 04/01/2016 8.7* 8.9 - 10.3 mg/dL Final  . GFR calc non Af Amer 04/01/2016 >60  >60 mL/min Final  . GFR calc Af Amer 04/01/2016 >60  >60 mL/min Final   Comment: (NOTE) The eGFR has been calculated using the CKD EPI equation. This calculation has not been validated in all clinical situations. eGFR's persistently <60 mL/min signify possible Chronic Kidney Disease.   . Anion gap 04/01/2016 9  5 - 15 Final  . Magnesium 04/01/2016 1.6* 1.7 - 2.4 mg/dL Final  . WBC 04/01/2016 5.0  4.0 - 10.5 K/uL Final  . RBC 04/01/2016 4.27  3.87 - 5.11 MIL/uL Final  . Hemoglobin 04/01/2016 12.4  12.0 - 15.0 g/dL Final  . HCT 04/01/2016 38.3  36.0 - 46.0 % Final  . MCV 04/01/2016 89.7  78.0 - 100.0 fL Final  . MCH 04/01/2016 29.0  26.0 - 34.0 pg Final  . MCHC 04/01/2016 32.4  30.0 - 36.0 g/dL Final   . RDW 04/01/2016 13.4  11.5 - 15.5 % Final  . Platelets 04/01/2016 174  150 - 400 K/uL Final  . Sodium 04/02/2016 144  135 - 145 mmol/L Final  . Potassium 04/02/2016 4.5  3.5 - 5.1 mmol/L Final   Comment: DELTA CHECK NOTED SPECIMEN HEMOLYZED. HEMOLYSIS MAY AFFECT INTEGRITY OF RESULTS.   Marland Kitchen Chloride 04/02/2016 109  101 - 111 mmol/L Final  . CO2 04/02/2016 27  22 - 32 mmol/L Final  . Glucose, Bld 04/02/2016 94  65 - 99 mg/dL Final  . BUN 04/02/2016 <5* 6 - 20 mg/dL Final  . Creatinine, Ser 04/02/2016 0.80  0.44 - 1.00 mg/dL Final  . Calcium 04/02/2016 8.8* 8.9 - 10.3 mg/dL Final  . GFR calc non Af Amer 04/02/2016 >60  >60 mL/min Final  . GFR calc Af Amer 04/02/2016 >60  >60 mL/min Final   Comment: (NOTE) The eGFR has been calculated using the CKD EPI equation. This calculation has not been validated in all clinical situations. eGFR's persistently <60 mL/min signify possible Chronic Kidney Disease.   . Anion gap 04/02/2016 8  5 - 15 Final  . Magnesium 04/02/2016 1.7  1.7 - 2.4 mg/dL Final  . WBC 04/02/2016 4.1  4.0 - 10.5 K/uL Final  . RBC 04/02/2016 4.15  3.87 - 5.11 MIL/uL Final  . Hemoglobin 04/02/2016 12.6  12.0 - 15.0 g/dL Final  . HCT 04/02/2016 37.6  36.0 - 46.0 % Final  . MCV 04/02/2016 90.6  78.0 - 100.0 fL Final  . MCH 04/02/2016 30.4  26.0 - 34.0 pg Final  . MCHC 04/02/2016 33.5  30.0 - 36.0 g/dL Final  . RDW 04/02/2016 13.6  11.5 - 15.5 % Final  . Platelets 04/02/2016 158  150 - 400 K/uL Final  . Glucose-Capillary 04/02/2016 101* 65 - 99 mg/dL Final  . Sodium 04/03/2016 141  135 - 145 mmol/L Final  . Potassium 04/03/2016 4.9  3.5 - 5.1 mmol/L Final  . Chloride 04/03/2016 108  101 - 111 mmol/L Final  . CO2 04/03/2016 23  22 - 32 mmol/L Final  . Glucose, Bld 04/03/2016 78  65 - 99 mg/dL Final  . BUN 04/03/2016 9  6 - 20 mg/dL Final  . Creatinine, Ser 04/03/2016 0.76  0.44 - 1.00 mg/dL Final  . Calcium 04/03/2016 9.1  8.9 - 10.3 mg/dL Final  . GFR calc non Af Amer  04/03/2016 >60  >60 mL/min Final  . GFR calc Af Amer 04/03/2016 >60  >60 mL/min Final   Comment: (NOTE) The eGFR has been calculated using the CKD EPI equation. This calculation has not been validated in all clinical situations. eGFR's persistently <60 mL/min signify possible Chronic Kidney Disease.   . Anion gap 04/03/2016 10  5 - 15 Final    Ct Head Wo Contrast  03/30/2016  CLINICAL DATA:  Aphasia.  Code stroke. EXAM: CT HEAD WITHOUT CONTRAST TECHNIQUE: Contiguous axial images were obtained from the base of the skull through the vertex without intravenous contrast. COMPARISON:  CT 09/16/2015 and 02/26/2015. FINDINGS: Brain: There is no evidence of acute intracranial hemorrhage, mass lesion, brain edema or extra-axial fluid collection. Chronic ventriculomegaly appears similar to prior studies. There is chronic periventricular white matter disease with chronic asymmetric left frontal lobe encephalomalacia. There is no CT evidence of acute cortical infarction. Extensive intracranial vascular calcifications are noted. Bones/sinuses/visualized face: The visualized paranasal sinuses, mastoid air cells and middle ears are clear. The calvarium is intact. IMPRESSION: 1. No CT evidence of acute stroke or hemorrhage. 2. Chronic ventriculomegaly, periventricular white matter disease and left frontal lobe encephalomalacia. 3. These results were called by telephone at the time of interpretation on 03/30/2016 at 12:41 pm to Shanon Brow, [place] for Dr. Leonel Ramsay , who verbally acknowledged these results. Electronically Signed   By: Richardean Sale M.D.   On: 03/30/2016 12:51     Assessment/Plan   ICD-9-CM ICD-10-CM   1. Dementia, without behavioral disturbance 294.20 F03.90   2. Seizures (HCC) 780.39 R56.9   3. Essential hypertension 401.9 I10   4. History of stroke V12.54 Z86.73   5. Hypokalemia - resolved 276.8 E87.6   6. Hyperlipidemia LDL goal <100 272.4 E78.5    Check BMP to follow Na and K  Resume namenda  xr 2m daily.  T/c namzeric if tolerate  Cont other meds as ordered  PT/OT as ordered  GOAL: short term rehab with potential for long term care. Communicated with pt and nursing.  Will follow  Khoen Genet S. CPerlie Gold PApollo Surgery Centerand Adult Medicine [address] Orange CourtGCressona Massac [address]([phone] (Monday-Friday 8 AM - 5 PM) ([phone] 5 PM and follow prompts

## 2016-04-09 LAB — BASIC METABOLIC PANEL
BUN: 15 mg/dL (ref 4–21)
CREATININE: 0.9 mg/dL (ref 0.5–1.1)
Glucose: 101 mg/dL
Potassium: 4.2 mmol/L (ref 3.4–5.3)
Sodium: 143 mmol/L (ref 137–147)

## 2016-04-28 ENCOUNTER — Ambulatory Visit: Payer: Medicare Other | Admitting: Internal Medicine

## 2016-04-28 DIAGNOSIS — Z0289 Encounter for other administrative examinations: Secondary | ICD-10-CM

## 2016-05-18 DIAGNOSIS — G8194 Hemiplegia, unspecified affecting left nondominant side: Secondary | ICD-10-CM | POA: Diagnosis not present

## 2016-05-18 DIAGNOSIS — M6281 Muscle weakness (generalized): Secondary | ICD-10-CM | POA: Diagnosis not present

## 2016-05-19 DIAGNOSIS — M6281 Muscle weakness (generalized): Secondary | ICD-10-CM | POA: Diagnosis not present

## 2016-05-19 DIAGNOSIS — G8194 Hemiplegia, unspecified affecting left nondominant side: Secondary | ICD-10-CM | POA: Diagnosis not present

## 2016-05-20 DIAGNOSIS — G8194 Hemiplegia, unspecified affecting left nondominant side: Secondary | ICD-10-CM | POA: Diagnosis not present

## 2016-05-20 DIAGNOSIS — M6281 Muscle weakness (generalized): Secondary | ICD-10-CM | POA: Diagnosis not present

## 2016-05-21 DIAGNOSIS — M6281 Muscle weakness (generalized): Secondary | ICD-10-CM | POA: Diagnosis not present

## 2016-05-21 DIAGNOSIS — G8194 Hemiplegia, unspecified affecting left nondominant side: Secondary | ICD-10-CM | POA: Diagnosis not present

## 2016-05-22 ENCOUNTER — Non-Acute Institutional Stay (SKILLED_NURSING_FACILITY): Payer: Medicare Other | Admitting: Nurse Practitioner

## 2016-05-22 DIAGNOSIS — Z8673 Personal history of transient ischemic attack (TIA), and cerebral infarction without residual deficits: Secondary | ICD-10-CM | POA: Diagnosis not present

## 2016-05-22 DIAGNOSIS — I10 Essential (primary) hypertension: Secondary | ICD-10-CM | POA: Diagnosis not present

## 2016-05-22 DIAGNOSIS — F0390 Unspecified dementia without behavioral disturbance: Secondary | ICD-10-CM | POA: Diagnosis not present

## 2016-05-22 DIAGNOSIS — G8194 Hemiplegia, unspecified affecting left nondominant side: Secondary | ICD-10-CM | POA: Diagnosis not present

## 2016-05-22 DIAGNOSIS — E785 Hyperlipidemia, unspecified: Secondary | ICD-10-CM

## 2016-05-22 DIAGNOSIS — R569 Unspecified convulsions: Secondary | ICD-10-CM

## 2016-05-22 DIAGNOSIS — M6281 Muscle weakness (generalized): Secondary | ICD-10-CM | POA: Diagnosis not present

## 2016-05-22 NOTE — Progress Notes (Signed)
Nursing Home Location:  [facility]   Place of Service: SNF (31)  PCP: Gildardo Cranker, DO  No Known Allergies  Chief Complaint  Patient presents with  . Medical Management of Chronic Issues    Routine Visit    HPI:  Patient is a 75 y.o. female seen today at [facility] for routine follow up. Pt [facility] at [facility]. Pt with hx of dementia, seizures, htn, CVA, hyperlipidemia. Pt has been doing well at [facility]. There has been been no acute issues in the last month.   Review of Systems:  Review of Systems  Past Medical History  Diagnosis Date  . CVA 02/10/2008  . ENDOCARDITIS 09/29/2010  . HYPERLIPIDEMIA 02/14/2007  . HYPERTENSION 02/14/2007  . MYOCARDIAL INFARCTION, HX OF 02/14/2007  . OBESITY 01/16/2009  . OBSTRUCTIVE SLEEP APNEA 05/27/2007  . 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 CORONARY ARTERY DISEASE 02/14/2007  . DEMENTIA 01/20/2010  . CAD (coronary artery disease)   . Myocardial infarct ([place])   . Edema   . Alzheimer disease   . GERD (gastroesophageal reflux disease)   . Glaucoma   . Gout   . Unspecified urinary incontinence   . Postmenopausal bleeding   . Morbid obesity ([place])   . Lack of coordination   . Aneurysm ([place])     Head   Past Surgical History  Procedure Laterality Date  . Tubal ligation     Social History:   reports that she has never smoked. She has never used smokeless tobacco. She reports that she does not drink alcohol or use illicit drugs.  Family History  Problem Relation Age of Onset  . Heart failure Mother     Deceased  . Kidney disease Brother     Deceased  . Heart disease Father     Deceased  . Cancer Mother   . Stroke Sister   . Hypertension Brother   . Hypertension Sister   . Heart disease Sister     Deceased  . Hypertension Sister     x2  . Hypertension Son     x2  . Glaucoma Son     Medications: Patient's Medications  New Prescriptions   No medications on file    Previous Medications   ACETAMINOPHEN (TYLENOL) 325 MG TABLET    Take 2 tablets (650 mg total) by mouth every 6 (six) hours as needed for mild pain (or Fever >/= 101).   ASPIRIN ([employer] CHILDRENS ASPIRIN) 81 MG CHEWABLE TABLET    TAKE 1 TABLET (81 MG TOTAL) BY MOUTH DAILY.   ATORVASTATIN (LIPITOR) 40 MG TABLET    Take 1 tablet (40 mg total) by mouth daily.   CALCIUM-VITAMIN D 600-200 MG-UNIT TABLET    Take 1 tablet by mouth daily.   CARVEDILOL (COREG) 25 MG TABLET    TAKE 1 TABLET BY MOUTH TWICE A DAY WITH A MEAL   DONEPEZIL (ARICEPT) 10 MG TABLET    Take 1 tablet (10 mg total) by mouth at bedtime.   FUROSEMIDE (LASIX) 40 MG TABLET    Take 1 tablet (40 mg total) by mouth daily.   LATANOPROST (XALATAN) 0.005 % OPHTHALMIC SOLUTION    Place 1 drop into both eyes at bedtime.   LEVETIRACETAM (KEPPRA) 500 MG TABLET    Take 1 tablet (500 mg total) by mouth 2 (two) times daily.   LISINOPRIL (PRINIVIL,ZESTRIL) 40 MG TABLET    Take 1 tablet (40 mg total) by mouth  daily.   MEMANTINE (NAMENDA XR) 28 MG CP24 24 HR CAPSULE    Take 28 mg by mouth daily.  Modified Medications   No medications on file  Discontinued Medications   No medications on file     Physical Exam: Filed Vitals:   05/22/16 1115  BP: 138/69  Pulse: 66  Temp: 98 F (36.7 C)  TempSrc: Oral  Resp: 20  Height: 5\' 2"  (1.575 m)  Weight: 207 lb 6.4 oz (94.076 kg)    Physical Exam  Labs reviewed: Basic Metabolic Panel:  Recent Labs  03/30/16 1713  04/01/16 0541 04/02/16 0625 04/03/16 0640 04/09/16  NA  --   < > 141 144 141 143  K  --   < > 3.3* 4.5 4.9 4.2  CL  --   < > 105 109 108  --   CO2  --   < > 27 27 23   --   GLUCOSE  --   < > 87 94 78  --   BUN  --   < > 7 <5* 9 15  CREATININE  --   < > 0.84 0.80 0.76 0.9  CALCIUM  --   < > 8.7* 8.8* 9.1  --   MG 1.7  --  1.6* 1.7  --   --   < > = values in this interval not displayed. Liver Function Tests:  Recent Labs  09/16/15 1321 03/30/16 1225  AST 22 21  ALT 13* 13*   ALKPHOS 84 63  BILITOT 0.9 0.8  PROT 6.7 6.5  ALBUMIN 3.5 3.5   No results for input(s): LIPASE, AMYLASE in the last 8760 hours. No results for input(s): AMMONIA in the last 8760 hours. CBC:  Recent Labs  09/16/15 1321  03/30/16 1225 03/30/16 1232 04/01/16 0541 04/02/16 0753  WBC 5.0  --  4.0  --  5.0 4.1  NEUTROABS 2.6  --  2.0  --   --   --   HGB 14.2  < > 13.6 13.9 12.4 12.6  HCT 43.5  < > 41.2 41.0 38.3 37.6  MCV 92.6  --  89.8  --  89.7 90.6  PLT 160  --  150  --  174 158  < > = values in this interval not displayed. TSH:  Recent Labs  03/30/16 1717  TSH 3.033   A1C: No results found for: HGBA1C Lipid Panel: No results for input(s): CHOL, HDL, LDLCALC, TRIG, CHOLHDL, LDLDIRECT in the last 8760 hours.   Assessment/Plan 1. Essential hypertension Blood pressure stable, conts on corege, lasix and lisinopril   2. History of stroke -stable conts on lipitor and ASA  3. Hyperlipidemia LDL goal <100 Last LDL of 93, will update lipids next month  4. Dementia, without behavioral disturbance Remains stable, without acute decline, conts on aricept and namenda   5. Seizures ([place]) No noted seizures, stable on keppra    Jessica K. Harle Battiest  [facility] [phone] 8 am - 5 pm) [phone] (after hours)

## 2016-05-23 DIAGNOSIS — G8194 Hemiplegia, unspecified affecting left nondominant side: Secondary | ICD-10-CM | POA: Diagnosis not present

## 2016-05-23 DIAGNOSIS — M6281 Muscle weakness (generalized): Secondary | ICD-10-CM | POA: Diagnosis not present

## 2016-05-25 DIAGNOSIS — G8194 Hemiplegia, unspecified affecting left nondominant side: Secondary | ICD-10-CM | POA: Diagnosis not present

## 2016-05-25 DIAGNOSIS — M6281 Muscle weakness (generalized): Secondary | ICD-10-CM | POA: Diagnosis not present

## 2016-05-26 DIAGNOSIS — M6281 Muscle weakness (generalized): Secondary | ICD-10-CM | POA: Diagnosis not present

## 2016-05-26 DIAGNOSIS — G8194 Hemiplegia, unspecified affecting left nondominant side: Secondary | ICD-10-CM | POA: Diagnosis not present

## 2016-05-27 DIAGNOSIS — G8194 Hemiplegia, unspecified affecting left nondominant side: Secondary | ICD-10-CM | POA: Diagnosis not present

## 2016-05-27 DIAGNOSIS — M6281 Muscle weakness (generalized): Secondary | ICD-10-CM | POA: Diagnosis not present

## 2016-05-28 DIAGNOSIS — G8194 Hemiplegia, unspecified affecting left nondominant side: Secondary | ICD-10-CM | POA: Diagnosis not present

## 2016-05-28 DIAGNOSIS — M6281 Muscle weakness (generalized): Secondary | ICD-10-CM | POA: Diagnosis not present

## 2016-05-29 DIAGNOSIS — G8194 Hemiplegia, unspecified affecting left nondominant side: Secondary | ICD-10-CM | POA: Diagnosis not present

## 2016-05-29 DIAGNOSIS — M6281 Muscle weakness (generalized): Secondary | ICD-10-CM | POA: Diagnosis not present

## 2016-05-30 DIAGNOSIS — G8194 Hemiplegia, unspecified affecting left nondominant side: Secondary | ICD-10-CM | POA: Diagnosis not present

## 2016-05-30 DIAGNOSIS — M6281 Muscle weakness (generalized): Secondary | ICD-10-CM | POA: Diagnosis not present

## 2016-06-01 DIAGNOSIS — M6281 Muscle weakness (generalized): Secondary | ICD-10-CM | POA: Diagnosis not present

## 2016-06-01 DIAGNOSIS — G8194 Hemiplegia, unspecified affecting left nondominant side: Secondary | ICD-10-CM | POA: Diagnosis not present

## 2016-06-02 DIAGNOSIS — M6281 Muscle weakness (generalized): Secondary | ICD-10-CM | POA: Diagnosis not present

## 2016-06-02 DIAGNOSIS — G8194 Hemiplegia, unspecified affecting left nondominant side: Secondary | ICD-10-CM | POA: Diagnosis not present

## 2016-06-03 DIAGNOSIS — G8194 Hemiplegia, unspecified affecting left nondominant side: Secondary | ICD-10-CM | POA: Diagnosis not present

## 2016-06-03 DIAGNOSIS — M6281 Muscle weakness (generalized): Secondary | ICD-10-CM | POA: Diagnosis not present

## 2016-06-04 DIAGNOSIS — M6281 Muscle weakness (generalized): Secondary | ICD-10-CM | POA: Diagnosis not present

## 2016-06-04 DIAGNOSIS — G8194 Hemiplegia, unspecified affecting left nondominant side: Secondary | ICD-10-CM | POA: Diagnosis not present

## 2016-06-05 DIAGNOSIS — M6281 Muscle weakness (generalized): Secondary | ICD-10-CM | POA: Diagnosis not present

## 2016-06-05 DIAGNOSIS — G8194 Hemiplegia, unspecified affecting left nondominant side: Secondary | ICD-10-CM | POA: Diagnosis not present

## 2016-06-08 DIAGNOSIS — M6281 Muscle weakness (generalized): Secondary | ICD-10-CM | POA: Diagnosis not present

## 2016-06-08 DIAGNOSIS — G8194 Hemiplegia, unspecified affecting left nondominant side: Secondary | ICD-10-CM | POA: Diagnosis not present

## 2016-06-09 DIAGNOSIS — G8194 Hemiplegia, unspecified affecting left nondominant side: Secondary | ICD-10-CM | POA: Diagnosis not present

## 2016-06-09 DIAGNOSIS — M6281 Muscle weakness (generalized): Secondary | ICD-10-CM | POA: Diagnosis not present

## 2016-06-12 DIAGNOSIS — I2510 Atherosclerotic heart disease of native coronary artery without angina pectoris: Secondary | ICD-10-CM | POA: Diagnosis not present

## 2016-06-13 LAB — CBC AND DIFFERENTIAL
HCT: 37 % (ref 36–46)
Hemoglobin: 12.4 g/dL (ref 12.0–16.0)
Platelets: 195 10*3/uL (ref 150–399)
WBC: 8 10^3/mL

## 2016-06-26 ENCOUNTER — Encounter: Payer: Self-pay | Admitting: Nurse Practitioner

## 2016-06-26 ENCOUNTER — Non-Acute Institutional Stay (SKILLED_NURSING_FACILITY): Payer: Medicare Other | Admitting: Nurse Practitioner

## 2016-06-26 DIAGNOSIS — F0390 Unspecified dementia without behavioral disturbance: Secondary | ICD-10-CM | POA: Diagnosis not present

## 2016-06-26 DIAGNOSIS — N9489 Other specified conditions associated with female genital organs and menstrual cycle: Secondary | ICD-10-CM

## 2016-06-26 DIAGNOSIS — E785 Hyperlipidemia, unspecified: Secondary | ICD-10-CM

## 2016-06-26 DIAGNOSIS — Z8673 Personal history of transient ischemic attack (TIA), and cerebral infarction without residual deficits: Secondary | ICD-10-CM | POA: Diagnosis not present

## 2016-06-26 DIAGNOSIS — R569 Unspecified convulsions: Secondary | ICD-10-CM

## 2016-06-26 DIAGNOSIS — I10 Essential (primary) hypertension: Secondary | ICD-10-CM

## 2016-06-26 DIAGNOSIS — N858 Other specified noninflammatory disorders of uterus: Secondary | ICD-10-CM | POA: Insufficient documentation

## 2016-06-26 NOTE — Progress Notes (Signed)
Nursing Home Location:  [facility]  Place of Service: SNF (31)  PCP: Gildardo Cranker, DO  No Known Allergies  Chief Complaint  Patient presents with  . Medical Management of Chronic Issues    Routine Visit    HPI:  Patient is a 75 y.o. female seen today at [facility] for routine follow up. Pt will now be at [facility] long term. Pt with hx of dementia, seizures, htn, CVA, hyperlipidemia.staff reports ongoing vaginal bleeding. Family aware and this is due to uterine mass. Pt denies pain and reports she is feeling well. Eating good. Mood has been stable.   Review of Systems:  Review of Systems  Constitutional: Negative for activity change, appetite change, fatigue and unexpected weight change.  HENT: Negative for congestion and hearing loss.   Eyes: Negative.   Respiratory: Negative for cough and shortness of breath.   Cardiovascular: Negative for chest pain, palpitations and leg swelling.  Gastrointestinal: Negative for abdominal pain, diarrhea and constipation.  Genitourinary: Positive for vaginal bleeding. Negative for dysuria and difficulty urinating.  Musculoskeletal: Negative for myalgias and arthralgias.  Skin: Negative for color change and wound.  Neurological: Negative for dizziness and weakness.  Psychiatric/Behavioral: Positive for confusion. Negative for behavioral problems and agitation.    Past Medical History  Diagnosis Date  . CVA 02/10/2008  . ENDOCARDITIS 09/29/2010  . HYPERLIPIDEMIA 02/14/2007  . HYPERTENSION 02/14/2007  . MYOCARDIAL INFARCTION, HX OF 02/14/2007  . OBESITY 01/16/2009  . OBSTRUCTIVE SLEEP APNEA 05/27/2007  . 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 CORONARY ARTERY DISEASE 02/14/2007  . DEMENTIA 01/20/2010  . CAD (coronary artery disease)   . Myocardial infarct ([place])   . Edema   . Alzheimer disease   . GERD (gastroesophageal reflux disease)   . Glaucoma   . Gout   . Unspecified urinary incontinence   . Postmenopausal  bleeding   . Morbid obesity ([place])   . Lack of coordination   . 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 Tubal ligation     Social History:   reports that she has never smoked. She has never used smokeless tobacco. She reports that she does not drink alcohol or use illicit drugs.  Family History  Problem Relation Age of Onset  . Heart failure Mother     Deceased  . Kidney disease Brother     Deceased  . Heart disease Father     Deceased  . Cancer Mother   . Stroke Sister   . Hypertension Brother   . Hypertension Sister   . Heart disease Sister     Deceased  . Hypertension Sister     x2  . Hypertension Son     x2  . Glaucoma Son     Medications: Patient's Medications  New Prescriptions   No medications on file  Previous Medications   ACETAMINOPHEN (TYLENOL) 325 MG TABLET    Take 2 tablets (650 mg total) by mouth every 6 (six) hours as needed for mild pain (or Fever >/= 101).   ASPIRIN ([employer] CHILDRENS ASPIRIN) 81 MG CHEWABLE TABLET    TAKE 1 TABLET (81 MG TOTAL) BY MOUTH DAILY.   ATORVASTATIN (LIPITOR) 40 MG TABLET    Take 1 tablet (40 mg total) by mouth daily.   CALCIUM-VITAMIN D 600-200 MG-UNIT TABLET    Take 1 tablet by mouth daily.   CARVEDILOL (COREG) 25 MG TABLET    TAKE 1  TABLET BY MOUTH TWICE A DAY WITH A MEAL   DONEPEZIL (ARICEPT) 10 MG TABLET    Take 1 tablet (10 mg total) by mouth at bedtime.   FUROSEMIDE (LASIX) 40 MG TABLET    Take 1 tablet (40 mg total) by mouth daily.   LATANOPROST (XALATAN) 0.005 % OPHTHALMIC SOLUTION    Place 1 drop into both eyes at bedtime.   LEVETIRACETAM (KEPPRA) 500 MG TABLET    Take 1 tablet (500 mg total) by mouth 2 (two) times daily.   LISINOPRIL (PRINIVIL,ZESTRIL) 40 MG TABLET    Take 1 tablet (40 mg total) by mouth daily.   MEMANTINE (NAMENDA XR) 28 MG CP24 24 HR CAPSULE    Take 28 mg by mouth daily.  Modified Medications   No medications on file  Discontinued Medications   No medications  on file     Physical Exam: Filed Vitals:   06/26/16 1133  BP: 138/79  Pulse: 72  Temp: 97.2 F (36.2 C)  TempSrc: Oral  Resp: 20  Height: 5\' 2"  (1.575 m)  Weight: 205 lb 12.8 oz (93.35 kg)    Physical Exam  Constitutional: She appears well-developed and well-nourished. No distress.  HENT:  Head: Normocephalic and atraumatic.  Mouth/Throat: Oropharynx is clear and moist. No oropharyngeal exudate.  Eyes: Conjunctivae are normal. Pupils are equal, round, and reactive to light.  Neck: Normal range of motion. Neck supple.  Cardiovascular: Normal rate, regular rhythm and normal heart sounds.   Pulmonary/Chest: Effort normal and breath sounds normal.  Abdominal: Soft. Bowel sounds are normal.  Musculoskeletal: She exhibits no edema or tenderness.  Neurological: She is alert.  Alert and Oriented to person only  Left sided hemiparesis   Skin: Skin is warm and dry. She is not diaphoretic.  Psychiatric: She has a normal mood and affect.    Labs reviewed: Basic Metabolic Panel:  Recent Labs  03/30/16 1713  04/01/16 0541 04/02/16 0625 04/03/16 0640 04/09/16  NA  --   < > 141 144 141 143  K  --   < > 3.3* 4.5 4.9 4.2  CL  --   < > 105 109 108  --   CO2  --   < > 27 27 23   --   GLUCOSE  --   < > 87 94 78  --   BUN  --   < > 7 <5* 9 15  CREATININE  --   < > 0.84 0.80 0.76 0.9  CALCIUM  --   < > 8.7* 8.8* 9.1  --   MG 1.7  --  1.6* 1.7  --   --   < > = values in this interval not displayed. Liver Function Tests:  Recent Labs  09/16/15 1321 03/30/16 1225  AST 22 21  ALT 13* 13*  ALKPHOS 84 63  BILITOT 0.9 0.8  PROT 6.7 6.5  ALBUMIN 3.5 3.5   No results for input(s): LIPASE, AMYLASE in the last 8760 hours. No results for input(s): AMMONIA in the last 8760 hours. CBC:  Recent Labs  09/16/15 1321  03/30/16 1225 03/30/16 1232 04/01/16 0541 04/02/16 0753  WBC 5.0  --  4.0  --  5.0 4.1  NEUTROABS 2.6  --  2.0  --   --   --   HGB 14.2  < > 13.6 13.9 12.4 12.6    HCT 43.5  < > 41.2 41.0 38.3 37.6  MCV 92.6  --  89.8  --  89.7 90.6  PLT 160  --  150  --  174 158  < > = values in this interval not displayed. TSH:  Recent Labs  03/30/16 1717  TSH 3.033   A1C: No results found for: HGBA1C Lipid Panel: No results for input(s): CHOL, HDL, LDLCALC, TRIG, CHOLHDL, LDLDIRECT in the last 8760 hours.   Assessment/Plan 1. Essential hypertension Blood pressure stable, conts on coreg BID, lasix, and lisinopril daily   -will follow up cmp  2. Dementia, without behavioral disturbance Stable, conts on namenda and aricept  3. Uterine mass Found at [facility] during work up of postmenopausal bleeding. Mass was suspicion for seroma of the uterus in the past.  Pt was not a candidate for surgery and family did not wish to persue further work up.  Will follow up CBC at this time  4. Hyperlipidemia LDL goal <100 No recent lipid panel noted, will follow up cholesterol -conts on lipitor  5. History of stroke Stable at this time, conts on lipitor and ASA  6. Seizures ([place]) No recent seizures, conts on keppra BID    Cyrena Kuchenbecker K. Harle Battiest  [facility] [phone] 8 am - 5 pm) [phone] (after hours)

## 2016-06-27 DIAGNOSIS — I10 Essential (primary) hypertension: Secondary | ICD-10-CM | POA: Diagnosis not present

## 2016-06-27 DIAGNOSIS — E785 Hyperlipidemia, unspecified: Secondary | ICD-10-CM | POA: Diagnosis not present

## 2016-06-27 DIAGNOSIS — I2510 Atherosclerotic heart disease of native coronary artery without angina pectoris: Secondary | ICD-10-CM | POA: Diagnosis not present

## 2016-06-27 DIAGNOSIS — F0390 Unspecified dementia without behavioral disturbance: Secondary | ICD-10-CM | POA: Diagnosis not present

## 2016-06-28 LAB — BASIC METABOLIC PANEL
BUN: 19 mg/dL (ref 4–21)
Creatinine: 0.9 mg/dL (ref 0.5–1.1)
Glucose: 87 mg/dL
Potassium: 3.9 mmol/L (ref 3.4–5.3)
Sodium: 149 mmol/L — AB (ref 137–147)

## 2016-06-28 LAB — CBC AND DIFFERENTIAL
HCT: 31 % — AB (ref 36–46)
HEMOGLOBIN: 10 g/dL — AB (ref 12.0–16.0)
PLATELETS: 227 10*3/uL (ref 150–399)
WBC: 5.9 10*3/mL

## 2016-06-28 LAB — HEPATIC FUNCTION PANEL
ALK PHOS: 70 U/L (ref 25–125)
ALT: 10 U/L (ref 7–35)
AST: 12 U/L — AB (ref 13–35)
Bilirubin, Total: 0.3 mg/dL

## 2016-06-28 LAB — LIPID PANEL
CHOLESTEROL: 132 mg/dL (ref 0–200)
HDL: 42 mg/dL (ref 35–70)
LDL Cholesterol: 81 mg/dL
Triglycerides: 46 mg/dL (ref 40–160)

## 2016-07-17 ENCOUNTER — Non-Acute Institutional Stay (SKILLED_NURSING_FACILITY): Payer: Medicare Other | Admitting: Nurse Practitioner

## 2016-07-17 ENCOUNTER — Encounter: Payer: Self-pay | Admitting: Nurse Practitioner

## 2016-07-17 DIAGNOSIS — F0390 Unspecified dementia without behavioral disturbance: Secondary | ICD-10-CM

## 2016-07-17 DIAGNOSIS — I10 Essential (primary) hypertension: Secondary | ICD-10-CM

## 2016-07-17 DIAGNOSIS — D62 Acute posthemorrhagic anemia: Secondary | ICD-10-CM | POA: Diagnosis not present

## 2016-07-17 DIAGNOSIS — E785 Hyperlipidemia, unspecified: Secondary | ICD-10-CM

## 2016-07-17 DIAGNOSIS — Z8673 Personal history of transient ischemic attack (TIA), and cerebral infarction without residual deficits: Secondary | ICD-10-CM | POA: Diagnosis not present

## 2016-07-17 NOTE — Progress Notes (Signed)
Nursing Home Location:  [facility]  Place of Service: SNF (31)  PCP: Gildardo Cranker, DO  No Known Allergies  Chief Complaint  Patient presents with  . Medical Management of Chronic Issues    Routine Visit    HPI:  Patient is a 75 y.o. female seen today at [facility] for routine follow up. Pt with hx of dementia, seizures, htn, CVA, hyperlipidemia, vaginal bleeding due to uterine mass. There has been no acute issues in the last month. Pt reports she is doing well. Staff has no new concerns at this time  Review of Systems:  Review of Systems  Constitutional: Negative for activity change, appetite change, fatigue and unexpected weight change.  HENT: Negative for congestion and hearing loss.   Eyes: Negative.   Respiratory: Negative for cough and shortness of breath.   Cardiovascular: Negative for chest pain, palpitations and leg swelling.  Gastrointestinal: Negative for abdominal pain, constipation and diarrhea.  Genitourinary: Positive for vaginal bleeding. Negative for difficulty urinating and dysuria.  Musculoskeletal: Negative for arthralgias and myalgias.  Skin: Negative for color change and wound.  Neurological: Negative for dizziness and weakness.  Psychiatric/Behavioral: Positive for confusion. Negative for agitation and behavioral problems.    Past Medical History:  Diagnosis Date  . Alzheimer disease   . Aneurysm ([place])    Head  . CAD (coronary artery disease)   . CORONARY ARTERY DISEASE 02/14/2007  . CVA 02/10/2008  . DEMENTIA 01/20/2010  . Edema   . ENDOCARDITIS 09/29/2010  . GERD (gastroesophageal reflux disease)   . Glaucoma   . Gout   . HYPERLIPIDEMIA 02/14/2007  . HYPERTENSION 02/14/2007  . Lack of coordination   . Morbid obesity ([place])   . Myocardial infarct ([place])   . MYOCARDIAL INFARCTION, HX OF 02/14/2007  . OBESITY 01/16/2009  . OBSTRUCTIVE SLEEP APNEA 05/27/2007  . 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 Postmenopausal bleeding    . Unspecified urinary incontinence    Past Surgical History:  Procedure Laterality Date  . TUBAL LIGATION     Social History:   reports that she has never smoked. She has never used smokeless tobacco. She reports that she does not drink alcohol or use drugs.  Family History  Problem Relation Age of Onset  . Heart failure Mother     Deceased  . Cancer Mother   . Heart disease Father     Deceased  . Kidney disease Brother     Deceased  . Stroke Sister   . Hypertension Brother   . Hypertension Sister   . Heart disease Sister     Deceased  . Hypertension Sister     x2  . Hypertension Son     x2  . Glaucoma Son     Medications: Patient's Medications  New Prescriptions   No medications on file  Previous Medications   ACETAMINOPHEN (TYLENOL) 325 MG TABLET    Take 2 tablets (650 mg total) by mouth every 6 (six) hours as needed for mild pain (or Fever >/= 101).   ASPIRIN ([employer] CHILDRENS ASPIRIN) 81 MG CHEWABLE TABLET    TAKE 1 TABLET (81 MG TOTAL) BY MOUTH DAILY.   ATORVASTATIN (LIPITOR) 40 MG TABLET    Take 1 tablet (40 mg total) by mouth daily.   CALCIUM-VITAMIN D 600-200 MG-UNIT TABLET    Take 1 tablet by mouth daily.   CARVEDILOL (COREG) 25 MG TABLET    TAKE 1 TABLET BY MOUTH TWICE A DAY WITH A MEAL  DONEPEZIL (ARICEPT) 10 MG TABLET    Take 1 tablet (10 mg total) by mouth at bedtime.   FUROSEMIDE (LASIX) 40 MG TABLET    Take 1 tablet (40 mg total) by mouth daily.   LATANOPROST (XALATAN) 0.005 % OPHTHALMIC SOLUTION    Place 1 drop into both eyes at bedtime.   LEVETIRACETAM (KEPPRA) 500 MG TABLET    Take 1 tablet (500 mg total) by mouth 2 (two) times daily.   LISINOPRIL (PRINIVIL,ZESTRIL) 40 MG TABLET    Take 1 tablet (40 mg total) by mouth daily.   MEMANTINE (NAMENDA XR) 28 MG CP24 24 HR CAPSULE    Take 28 mg by mouth daily.  Modified Medications   No medications on file  Discontinued Medications   No medications on file     Physical Exam: Vitals:   07/17/16 1129    BP: 120/72  Pulse: 72  Resp: 20  Temp: 97.2 F (36.2 C)  TempSrc: Oral  Weight: 202 lb (91.6 kg)  Height: 5\' 2"  (1.575 m)    Physical Exam  Constitutional: She appears well-developed and well-nourished. No distress.  HENT:  Head: Normocephalic and atraumatic.  Mouth/Throat: Oropharynx is clear and moist. No oropharyngeal exudate.  Eyes: Conjunctivae are normal. Pupils are equal, round, and reactive to light.  Neck: Normal range of motion. Neck supple.  Cardiovascular: Normal rate, regular rhythm and normal heart sounds.   Pulmonary/Chest: Effort normal and breath sounds normal.  Abdominal: Soft. Bowel sounds are normal.  Musculoskeletal: She exhibits no edema or tenderness.  Neurological: She is alert.  Alert and Oriented to person only  Left sided hemiparesis   Skin: Skin is warm and dry. She is not diaphoretic.  Psychiatric: She has a normal mood and affect.    Labs reviewed: Basic Metabolic Panel:  Recent Labs  03/30/16 1713  04/01/16 0541 04/02/16 0625 04/03/16 0640 04/09/16 06/28/16  NA  --   < > 141 144 141 143 149*  K  --   < > 3.3* 4.5 4.9 4.2 3.9  CL  --   < > 105 109 108  --   --   CO2  --   < > 27 27 23   --   --   GLUCOSE  --   < > 87 94 78  --   --   BUN  --   < > 7 <5* 9 15 19   CREATININE  --   < > 0.84 0.80 0.76 0.9 0.9  CALCIUM  --   < > 8.7* 8.8* 9.1  --   --   MG 1.7  --  1.6* 1.7  --   --   --   < > = values in this interval not displayed. Liver Function Tests:  Recent Labs  09/16/15 1321 03/30/16 1225 06/28/16  AST 22 21 12*  ALT 13* 13* 10  ALKPHOS 84 63 70  BILITOT 0.9 0.8  --   PROT 6.7 6.5  --   ALBUMIN 3.5 3.5  --    No results for input(s): LIPASE, AMYLASE in the last 8760 hours. No results for input(s): AMMONIA in the last 8760 hours. CBC:  Recent Labs  09/16/15 1321  03/30/16 1225  04/01/16 0541 04/02/16 0753 06/13/16 06/28/16  WBC 5.0  --  4.0  --  5.0 4.1 8.0 5.9  NEUTROABS 2.6  --  2.0  --   --   --   --   --    HGB 14.2  < >  13.6  < > 12.4 12.6 12.4 10.0*  HCT 43.5  < > 41.2  < > 38.3 37.6 37 31*  MCV 92.6  --  89.8  --  89.7 90.6  --   --   PLT 160  --  150  --  174 158 195 227  < > = values in this interval not displayed. TSH:  Recent Labs  03/30/16 1717  TSH 3.033   A1C: No results found for: HGBA1C Lipid Panel:  Recent Labs  06/28/16  CHOL 132  HDL 42  LDLCALC 81  TRIG 46     Assessment/Plan 1. Essential hypertension Blood pressure remains stable on current regimen   2. Dementia, without behavioral disturbance Stable no acute changes in cognitive or functioanl status, remains on aricept and namenda  3. History of stroke Stable, conts on ASA  4. Acute blood loss anemia Due to uterine mass, hgb has dropped from weeks prior, will recheck CBC at this time  5. Hyperlipidemia LDL goal <100 LDL at goal. Maintained on lipitor 40 mg daily   Tekoa Amon K. Harle Battiest  [facility] [phone] 8 am - 5 pm) [phone] (after hours)

## 2016-07-18 DIAGNOSIS — E870 Hyperosmolality and hypernatremia: Secondary | ICD-10-CM | POA: Diagnosis not present

## 2016-07-18 DIAGNOSIS — I10 Essential (primary) hypertension: Secondary | ICD-10-CM | POA: Diagnosis not present

## 2016-07-19 LAB — CBC AND DIFFERENTIAL
HEMATOCRIT: 32 % — AB (ref 36–46)
HEMOGLOBIN: 9.8 g/dL — AB (ref 12.0–16.0)
PLATELETS: 216 10*3/uL (ref 150–399)
WBC: 4.6 10*3/mL

## 2016-07-19 LAB — BASIC METABOLIC PANEL
BUN: 21 mg/dL (ref 4–21)
CREATININE: 0.9 mg/dL (ref 0.5–1.1)
Glucose: 118 mg/dL
Potassium: 3.7 mmol/L (ref 3.4–5.3)
Sodium: 142 mmol/L (ref 137–147)

## 2016-07-25 DIAGNOSIS — G9340 Encephalopathy, unspecified: Secondary | ICD-10-CM | POA: Diagnosis not present

## 2016-07-25 DIAGNOSIS — M6281 Muscle weakness (generalized): Secondary | ICD-10-CM | POA: Diagnosis not present

## 2016-07-28 ENCOUNTER — Encounter: Payer: Self-pay | Admitting: Internal Medicine

## 2016-07-28 ENCOUNTER — Non-Acute Institutional Stay (SKILLED_NURSING_FACILITY): Payer: Medicare Other | Admitting: Internal Medicine

## 2016-07-28 DIAGNOSIS — F0390 Unspecified dementia without behavioral disturbance: Secondary | ICD-10-CM

## 2016-07-28 DIAGNOSIS — D649 Anemia, unspecified: Secondary | ICD-10-CM | POA: Diagnosis not present

## 2016-07-28 NOTE — Assessment & Plan Note (Signed)
Meaningful review of systems concerning the anemia could not be completed

## 2016-07-28 NOTE — Assessment & Plan Note (Signed)
CBC & dif ferritin B12 FOB

## 2016-07-28 NOTE — Patient Instructions (Signed)
CBC & dif ferritin B12 FOB check

## 2016-07-28 NOTE — Progress Notes (Signed)
    Facility Location: [facility]  Room Number: 214-A  Code Status: Full  This is a nursing facility follow up for specific acute issue of anemia  Interim medical record and care since last [place] visit was updated with review of diagnostic studies and change in clinical status since last visit were documented.  HPI: Since April this year she's demonstrated progressive anemia. On 06/28/16 hemoglobin was 10 and hematocrit  31. On 4/27 hemoglobin was 12.6, hematocrit 37.6. On 7/8, 12.4 and hematocrit 37. In 2011 hemoglobin was 9.4. At that time B12 level was low normal at 240. There are no iron or ferritin levels in the chart.   Review of systems: She denies any bleeding dyscrasias. Dementia invalidated responses. She was unable to give me her room number. Date given as 58??. She identified the president as Obama Epistaxis, hemoptysis, hematuria, melena, or rectal bleeding denied. No unexplained weight loss, significant dyspepsia,dysphagia, or abdominal pain.  There is no abnormal bruising , bleeding, or difficulty stopping bleeding with injury.  Physical exam:  Pertinent or positive findings: She is pleasant and interactive but disoriented 3. Surprisingly she was able to remember all the words of her favor hymn. Bilateral ptosis and arcus senilis are present. Slight resting exotropia of the right eye. Upper plate worn, mandible edentulous. 1/2+ edema at the sock line. Pedal pulses decreased. General appearance:Adequately nourished; no acute distress , increased work of breathing is present.   Lymphatic: No lymphadenopathy about the head, neck, axilla . Eyes: No conjunctival inflammation or lid edema is present. There is no scleral icterus. Ears:  External ear exam shows no significant lesions or deformities.   Nose:  External nasal examination shows no deformity or inflammation. Nasal mucosa are pink and moist without lesions ,exudates Oral  exam: lips and gums are healthy appearing.There is no oropharyngeal erythema or exudate . Neck:  No thyromegaly, masses, tenderness noted.    Heart:  Normal rate and regular rhythm. S1 and S2 normal without gallop, murmur, click, rub .  Lungs:Chest clear to auscultation without wheezes, rhonchi,rales , rubs. Abdomen:Bowel sounds are normal. Abdomen is soft and nontender with no organomegaly, hernias,masses. GU: deferred  Extremities:  No cyanosis, clubbing Neurologic exam : Strength equal  in upper & lower extremities Balance,Rhomberg,finger to nose testing could not be completed due to clinical state Deep tendon reflexes are equal Skin: Warm & dry w/o tenting. No significant lesions or rash.    See summary under each active problem in the Problem List with associated updated therapeutic plan

## 2016-07-29 DIAGNOSIS — D550 Anemia due to glucose-6-phosphate dehydrogenase [G6PD] deficiency: Secondary | ICD-10-CM | POA: Diagnosis not present

## 2016-07-29 DIAGNOSIS — D552 Anemia due to disorders of glycolytic enzymes: Secondary | ICD-10-CM | POA: Diagnosis not present

## 2016-07-29 DIAGNOSIS — I2510 Atherosclerotic heart disease of native coronary artery without angina pectoris: Secondary | ICD-10-CM | POA: Diagnosis not present

## 2016-07-29 DIAGNOSIS — E539 Vitamin B deficiency, unspecified: Secondary | ICD-10-CM | POA: Diagnosis not present

## 2016-07-30 LAB — CBC AND DIFFERENTIAL
HCT: 31 % — AB (ref 36–46)
Hemoglobin: 9.7 g/dL — AB (ref 12.0–16.0)
PLATELETS: 200 10*3/uL (ref 150–399)
WBC: 4.6 10*3/mL

## 2016-08-21 ENCOUNTER — Encounter: Payer: Self-pay | Admitting: Nurse Practitioner

## 2016-08-21 ENCOUNTER — Non-Acute Institutional Stay (SKILLED_NURSING_FACILITY): Payer: Medicare Other | Admitting: Nurse Practitioner

## 2016-08-21 DIAGNOSIS — D649 Anemia, unspecified: Secondary | ICD-10-CM | POA: Diagnosis not present

## 2016-08-21 DIAGNOSIS — F329 Major depressive disorder, single episode, unspecified: Secondary | ICD-10-CM

## 2016-08-21 DIAGNOSIS — I10 Essential (primary) hypertension: Secondary | ICD-10-CM

## 2016-08-21 DIAGNOSIS — F0390 Unspecified dementia without behavioral disturbance: Secondary | ICD-10-CM | POA: Diagnosis not present

## 2016-08-21 DIAGNOSIS — E785 Hyperlipidemia, unspecified: Secondary | ICD-10-CM | POA: Diagnosis not present

## 2016-08-21 DIAGNOSIS — R569 Unspecified convulsions: Secondary | ICD-10-CM | POA: Diagnosis not present

## 2016-08-21 DIAGNOSIS — F32A Depression, unspecified: Secondary | ICD-10-CM

## 2016-08-21 NOTE — Progress Notes (Signed)
? ?>1 ?   Nursing Home Location:  [facility]  Place of Service: SNF (31)  PCP: Unice Cobble, MD  No Known Allergies  Chief Complaint  Patient presents with  . Medical Management of Chronic Issues    Routine Visit    HPI:  Patient is a 75 y.o. female seen today at [facility] for routine follow up. Pt with hx of dementia, seizures, htn, CVA, hyperlipidemia, vaginal bleeding due to uterine mass.  Pt has been doing well in the last month without acute issues. Anemia remains stable. FOBT done which was negative. Pt conts to have vaginal bleeding on and off. Family aware of this. Per daughter who has been with her to appts in the past she is not a candidate for surgery and plan is to monitor anemia.  Pt reports depression. Not sleeping well. Melatonin was added.  Nursing with no other acute concerns.   Review of Systems:  Review of Systems  Constitutional: Negative for activity change, appetite change, fatigue and unexpected weight change.  HENT: Negative for congestion and hearing loss.   Eyes: Negative.   Respiratory: Negative for cough and shortness of breath.   Cardiovascular: Negative for chest pain, palpitations and leg swelling.  Gastrointestinal: Negative for abdominal pain, constipation and diarrhea.  Genitourinary: Positive for vaginal bleeding. Negative for difficulty urinating and dysuria.  Musculoskeletal: Negative for arthralgias and myalgias.  Skin: Negative for color change and wound.  Neurological: Negative for dizziness and weakness.  Psychiatric/Behavioral: Positive for confusion. Negative for agitation and behavioral problems.    Past Medical History:  Diagnosis Date  . Alzheimer disease   . 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 CAD (coronary artery disease)   . CORONARY ARTERY DISEASE 02/14/2007  . CVA 02/10/2008  . DEMENTIA 01/20/2010  . Edema   . ENDOCARDITIS 09/29/2010  . GERD (gastroesophageal reflux disease)   . Glaucoma   . Gout   .  HYPERLIPIDEMIA 02/14/2007  . HYPERTENSION 02/14/2007  . Lack of coordination   . Morbid obesity ([place])   . Myocardial infarct ([place])   . MYOCARDIAL INFARCTION, HX OF 02/14/2007  . OBESITY 01/16/2009  . OBSTRUCTIVE SLEEP APNEA 05/27/2007  . 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 Postmenopausal bleeding   . Unspecified urinary incontinence    Past Surgical History:  Procedure Laterality Date  . TUBAL LIGATION     Social History:   reports that she has never smoked. She has never used smokeless tobacco. She reports that she does not drink alcohol or use drugs.  Family History  Problem Relation Age of Onset  . Heart failure Mother     Deceased  . Cancer Mother   . Heart disease Father     Deceased  . Kidney disease Brother     Deceased  . Stroke Sister   . Hypertension Brother   . Hypertension Sister   . Heart disease Sister     Deceased  . Hypertension Sister     x2  . Hypertension Son     x2  . Glaucoma Son     Medications: Patient's Medications  New Prescriptions   No medications on file  Previous Medications   ACETAMINOPHEN (TYLENOL) 325 MG TABLET    Take 2 tablets (650 mg total) by mouth every 6 (six) hours as needed for mild pain (or Fever >/= 101).   ASPIRIN ([employer] CHILDRENS ASPIRIN) 81 MG CHEWABLE TABLET    TAKE 1 TABLET (81 MG TOTAL) BY MOUTH DAILY.  ATORVASTATIN (LIPITOR) 40 MG TABLET    Take 1 tablet (40 mg total) by mouth daily.   CALCIUM-VITAMIN D 600-200 MG-UNIT TABLET    Take 1 tablet by mouth daily.   CARVEDILOL (COREG) 25 MG TABLET    TAKE 1 TABLET BY MOUTH TWICE A DAY WITH A MEAL   DONEPEZIL (ARICEPT) 10 MG TABLET    Take 1 tablet (10 mg total) by mouth at bedtime.   FUROSEMIDE (LASIX) 40 MG TABLET    Take 1 tablet (40 mg total) by mouth daily.   LATANOPROST (XALATAN) 0.005 % OPHTHALMIC SOLUTION    Place 1 drop into both eyes at bedtime.   LEVETIRACETAM (KEPPRA) 500 MG TABLET    Take 1 tablet (500 mg total) by mouth 2 (two) times daily.   LISINOPRIL  (PRINIVIL,ZESTRIL) 40 MG TABLET    Take 1 tablet (40 mg total) by mouth daily.   MELATONIN 3 MG TABS    Take 1 tablet by mouth at bedtime.   MEMANTINE (NAMENDA XR) 28 MG CP24 24 HR CAPSULE    Take 28 mg by mouth daily.  Modified Medications   No medications on file  Discontinued Medications   No medications on file     Physical Exam: Vitals:   08/21/16 1339  BP: 110/78  Pulse: 88  Resp: 18  Temp: 97.6 F (36.4 C)  Weight: 200 lb 12.8 oz (91.1 kg)  Height: 5\' 2"  (1.575 m)    Physical Exam  Constitutional: She appears well-developed and well-nourished. No distress.  HENT:  Head: Normocephalic and atraumatic.  Mouth/Throat: Oropharynx is clear and moist. No oropharyngeal exudate.  Eyes: Conjunctivae are normal. Pupils are equal, round, and reactive to light.  Neck: Normal range of motion. Neck supple.  Cardiovascular: Normal rate, regular rhythm and normal heart sounds.   Pulmonary/Chest: Effort normal and breath sounds normal.  Abdominal: Soft. Bowel sounds are normal.  Musculoskeletal: She exhibits no edema or tenderness.  Neurological: She is alert.  Alert and Oriented to person only  Left sided hemiparesis   Skin: Skin is warm and dry. She is not diaphoretic.  Psychiatric: She has a normal mood and affect.    Labs reviewed: Basic Metabolic Panel:  Recent Labs  03/30/16 1713  04/01/16 0541 04/02/16 0625 04/03/16 0640 04/09/16 06/28/16 07/19/16  NA  --   < > 141 144 141 143 149* 142  K  --   < > 3.3* 4.5 4.9 4.2 3.9 3.7  CL  --   < > 105 109 108  --   --   --   CO2  --   < > 27 27 23   --   --   --   GLUCOSE  --   < > 87 94 78  --   --   --   BUN  --   < > 7 <5* 9 15 19 21   CREATININE  --   < > 0.84 0.80 0.76 0.9 0.9 0.9  CALCIUM  --   < > 8.7* 8.8* 9.1  --   --   --   MG 1.7  --  1.6* 1.7  --   --   --   --   < > = values in this interval not displayed. Liver Function Tests:  Recent Labs  09/16/15 1321 03/30/16 1225 06/28/16  AST 22 21 12*  ALT 13* 13*  10  ALKPHOS 84 63 70  BILITOT 0.9 0.8  --   PROT 6.7 6.5  --  ALBUMIN 3.5 3.5  --    No results for input(s): LIPASE, AMYLASE in the last 8760 hours. No results for input(s): AMMONIA in the last 8760 hours. CBC:  Recent Labs  09/16/15 1321  03/30/16 1225  04/01/16 0541 04/02/16 0753  06/28/16 07/19/16 07/30/16  WBC 5.0  --  4.0  --  5.0 4.1  < > 5.9 4.6 4.6  NEUTROABS 2.6  --  2.0  --   --   --   --   --   --   --   HGB 14.2  < > 13.6  < > 12.4 12.6  < > 10.0* 9.8* 9.7*  HCT 43.5  < > 41.2  < > 38.3 37.6  < > 31* 32* 31*  MCV 92.6  --  89.8  --  89.7 90.6  --   --   --   --   PLT 160  --  150  --  174 158  < > 227 216 200  < > = values in this interval not displayed. TSH:  Recent Labs  03/30/16 1717  TSH 3.033   A1C: No results found for: HGBA1C Lipid Panel:  Recent Labs  06/28/16  CHOL 132  HDL 42  LDLCALC 81  TRIG 46   Iron total 07/19/16: 49 TIBC  07/19/16:  264 Calcium 07/19/16: 9.0 Vitamin B12 07/30/16: Vitamin B12: 407 Ferritin 07/30/16:  19  FOB 07/30/16: Negative  Assessment/Plan 1. Depression Will start zoloft 25 mg daily for 1 week then to increase to zoloft 50 mg daily, nursing to monitor and notify for adverse effect   2. Essential hypertension Blood pressure stable. conts on lisinopril, lasix and coreg   3. Dementia, without behavioral disturbance Stable on namenda and aricept, no acute decline in functional status  4. Seizure ([place]) No witness seizures, conts on keppra   5. Anemia, unspecified Stable at this time. Will cont to monitor due to vaginal bleeding from uterine mass  6. Hyperlipidemia LDL goal <100 LDL at goal, conts on lipitor  7. Insomnia consts on melatonin 3 mg qhs  . Dannica Bickham K. Harle Battiest  [facility] [phone] 8 am - 5 pm) [phone] (after hours)

## 2016-08-26 DIAGNOSIS — N390 Urinary tract infection, site not specified: Secondary | ICD-10-CM | POA: Diagnosis not present

## 2016-09-11 DIAGNOSIS — Z23 Encounter for immunization: Secondary | ICD-10-CM | POA: Diagnosis not present

## 2016-09-15 ENCOUNTER — Encounter: Payer: Self-pay | Admitting: Internal Medicine

## 2016-09-15 ENCOUNTER — Non-Acute Institutional Stay (SKILLED_NURSING_FACILITY): Payer: Medicare Other | Admitting: Internal Medicine

## 2016-09-15 DIAGNOSIS — D171 Benign lipomatous neoplasm of skin and subcutaneous tissue of trunk: Secondary | ICD-10-CM | POA: Diagnosis not present

## 2016-09-15 NOTE — Assessment & Plan Note (Signed)
Staff educated as to the physiology of lipoma and its lack of clinical significance in the absence of secondary infection

## 2016-09-15 NOTE — Progress Notes (Signed)
   This is a nursing facility follow up for specific acute issue of R posterior thoracic mass.  Interim medical record and care since last [place] visit was updated with review of diagnostic studies and change in clinical status since last visit were documented.  HPI: Physical therapy staff brought the attention of a mass to her nurse who requested evaluation. The patient is demented and has no specific symptoms. No reported fever, chills, sweats, or change in weight.  Review of systems: Dementia prevented completion   Physical exam: General appearance:Adequately nourished; no acute distress , increased work of breathing is present.    The patient is not communicative. When exam was attempted she arches her back and pushes backward against the wheelchair. She does have a 4.5 x 7 cm mass of the right upper posterior chest. This does transilluminate. It is freely movable without attachment to the subcutaneous tissues. She has no cervical or axillary lymphadenopathy. There is no organomegaly present. Abdomen is protuberant. Skin: Warm & dry w/o tenting. No significant lesions or rash.    #1 large asymptomatic lipoma which requires no intervention as there is no sign of secondary cellulitis. Plan: The lipoma will be documented for future reference but no further evaluation is needed at this time.

## 2016-09-15 NOTE — Patient Instructions (Addendum)
Diagnostic characteristics, physiology , differential dx and lack of clinical significance of lipoma in the absence of associated cellulitis discussed with the staff

## 2016-09-18 DIAGNOSIS — G8194 Hemiplegia, unspecified affecting left nondominant side: Secondary | ICD-10-CM | POA: Diagnosis not present

## 2016-09-18 DIAGNOSIS — R293 Abnormal posture: Secondary | ICD-10-CM | POA: Diagnosis not present

## 2016-09-21 DIAGNOSIS — R293 Abnormal posture: Secondary | ICD-10-CM | POA: Diagnosis not present

## 2016-09-21 DIAGNOSIS — G8194 Hemiplegia, unspecified affecting left nondominant side: Secondary | ICD-10-CM | POA: Diagnosis not present

## 2016-09-22 DIAGNOSIS — R293 Abnormal posture: Secondary | ICD-10-CM | POA: Diagnosis not present

## 2016-09-22 DIAGNOSIS — G8194 Hemiplegia, unspecified affecting left nondominant side: Secondary | ICD-10-CM | POA: Diagnosis not present

## 2016-09-23 ENCOUNTER — Encounter: Payer: Self-pay | Admitting: Nurse Practitioner

## 2016-09-23 ENCOUNTER — Non-Acute Institutional Stay (SKILLED_NURSING_FACILITY): Payer: Medicare Other | Admitting: Nurse Practitioner

## 2016-09-23 DIAGNOSIS — G4709 Other insomnia: Secondary | ICD-10-CM | POA: Diagnosis not present

## 2016-09-23 DIAGNOSIS — F329 Major depressive disorder, single episode, unspecified: Secondary | ICD-10-CM

## 2016-09-23 DIAGNOSIS — R569 Unspecified convulsions: Secondary | ICD-10-CM | POA: Diagnosis not present

## 2016-09-23 DIAGNOSIS — H401134 Primary open-angle glaucoma, bilateral, indeterminate stage: Secondary | ICD-10-CM | POA: Diagnosis not present

## 2016-09-23 DIAGNOSIS — D62 Acute posthemorrhagic anemia: Secondary | ICD-10-CM

## 2016-09-23 DIAGNOSIS — I10 Essential (primary) hypertension: Secondary | ICD-10-CM

## 2016-09-23 DIAGNOSIS — F32A Depression, unspecified: Secondary | ICD-10-CM

## 2016-09-23 DIAGNOSIS — H2513 Age-related nuclear cataract, bilateral: Secondary | ICD-10-CM | POA: Diagnosis not present

## 2016-09-23 DIAGNOSIS — R293 Abnormal posture: Secondary | ICD-10-CM | POA: Diagnosis not present

## 2016-09-23 DIAGNOSIS — G8194 Hemiplegia, unspecified affecting left nondominant side: Secondary | ICD-10-CM | POA: Diagnosis not present

## 2016-09-23 NOTE — Progress Notes (Signed)
? ?>1 ?   Nursing Home Location:  [facility]  Place of Service: SNF (31)  PCP: Unice Cobble, MD  No Known Allergies  Chief Complaint  Patient presents with  . Medical Management of Chronic Issues    Routine Visit    HPI:  Patient is a 75 y.o. female seen today at [facility] for routine follow up. Pt with hx of dementia, seizures, htn, CVA, hyperlipidemia, vaginal bleeding due to uterine mass.  Seen by Dr Linna Darner due to lipoma in the last month  Pt was complaining of insomnia at last RV- melatonin was added and reports she is sleeping well now.  zoloft was also increased in the last month due to not sleeping well. Currently taking zoloft 50 mg daily and tolerating well. In good mood today.  Last hgb 9.7 on 07/30/16 no shortness of breath, chest pain noted Pt reports she is not having vaginal bleeding at this time.  Review of Systems:  Review of Systems  Constitutional: Negative for activity change, appetite change, fatigue and unexpected weight change.  HENT: Negative for congestion and hearing loss.   Eyes: Negative.   Respiratory: Negative for cough and shortness of breath.   Cardiovascular: Negative for chest pain, palpitations and leg swelling.  Gastrointestinal: Negative for abdominal pain, constipation and diarrhea.  Genitourinary: Negative for difficulty urinating, dysuria and vaginal bleeding.  Musculoskeletal: Negative for arthralgias and myalgias.  Skin: Negative for color change and wound.  Neurological: Negative for dizziness and weakness.  Psychiatric/Behavioral: Positive for confusion. Negative for agitation and behavioral problems.    Past Medical History:  Diagnosis Date  . Alzheimer disease   . 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 CAD (coronary artery disease)   . CORONARY ARTERY DISEASE 02/14/2007  . CVA 02/10/2008  . DEMENTIA 01/20/2010  . Edema   . ENDOCARDITIS 09/29/2010  . GERD (gastroesophageal reflux disease)   . Glaucoma   . Gout   .  HYPERLIPIDEMIA 02/14/2007  . HYPERTENSION 02/14/2007  . Lack of coordination   . Morbid obesity ([place])   . Myocardial infarct   . MYOCARDIAL INFARCTION, HX OF 02/14/2007  . OBESITY 01/16/2009  . OBSTRUCTIVE SLEEP APNEA 05/27/2007  . 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 Postmenopausal bleeding   . Unspecified urinary incontinence    Past Surgical History:  Procedure Laterality Date  . TUBAL LIGATION     Social History:   reports that she has never smoked. She has never used smokeless tobacco. She reports that she does not drink alcohol or use drugs.  Family History  Problem Relation Age of Onset  . Heart failure Mother     Deceased  . Cancer Mother   . Heart disease Father     Deceased  . Kidney disease Brother     Deceased  . Stroke Sister   . Hypertension Brother   . Hypertension Sister   . Heart disease Sister     Deceased  . Hypertension Sister     x2  . Hypertension Son     x2  . Glaucoma Son     Medications: Patient's Medications  New Prescriptions   No medications on file  Previous Medications   ACETAMINOPHEN (TYLENOL) 325 MG TABLET    Take 2 tablets (650 mg total) by mouth every 6 (six) hours as needed for mild pain (or Fever >/= 101).   ASPIRIN ([employer] CHILDRENS ASPIRIN) 81 MG CHEWABLE TABLET    TAKE 1 TABLET (81 MG TOTAL) BY  MOUTH DAILY.   ATORVASTATIN (LIPITOR) 40 MG TABLET    Take 1 tablet (40 mg total) by mouth daily.   CALCIUM-VITAMIN D 600-200 MG-UNIT TABLET    Take 1 tablet by mouth daily.   CARVEDILOL (COREG) 25 MG TABLET    TAKE 1 TABLET BY MOUTH TWICE A DAY WITH A MEAL   DONEPEZIL (ARICEPT) 10 MG TABLET    Take 1 tablet (10 mg total) by mouth at bedtime.   FUROSEMIDE (LASIX) 40 MG TABLET    Take 1 tablet (40 mg total) by mouth daily.   LATANOPROST (XALATAN) 0.005 % OPHTHALMIC SOLUTION    Place 1 drop into both eyes at bedtime.   LEVETIRACETAM (KEPPRA) 500 MG TABLET    Take 1 tablet (500 mg total) by mouth 2 (two) times daily.   LISINOPRIL  (PRINIVIL,ZESTRIL) 40 MG TABLET    Take 1 tablet (40 mg total) by mouth daily.   MELATONIN 3 MG TABS    Take 1 tablet by mouth at bedtime.   MEMANTINE (NAMENDA XR) 28 MG CP24 24 HR CAPSULE    Take 28 mg by mouth daily.   SERTRALINE (ZOLOFT) 50 MG TABLET    Take 50 mg by mouth at bedtime.  Modified Medications   No medications on file  Discontinued Medications   No medications on file     Physical Exam: Vitals:   09/23/16 1159  BP: 136/84  Pulse: 80  Resp: 19  Temp: 98.1 F (36.7 C)  Weight: 200 lb (90.7 kg)  Height: 5\' 2"  (1.575 m)    Physical Exam  Constitutional: She appears well-developed and well-nourished. No distress.  HENT:  Head: Normocephalic and atraumatic.  Mouth/Throat: Oropharynx is clear and moist. No oropharyngeal exudate.  Eyes: Conjunctivae are normal. Pupils are equal, round, and reactive to light.  Neck: Normal range of motion. Neck supple.  Cardiovascular: Normal rate, regular rhythm and normal heart sounds.   Pulmonary/Chest: Effort normal and breath sounds normal.  Abdominal: Soft. Bowel sounds are normal.  Musculoskeletal: She exhibits no edema or tenderness.  Neurological: She is alert.  Alert and Oriented to person only  Left sided hemiparesis   Skin: Skin is warm and dry. She is not diaphoretic.  Psychiatric: She has a normal mood and affect.    Labs reviewed: Basic Metabolic Panel:  Recent Labs  03/30/16 1713  04/01/16 0541 04/02/16 0625 04/03/16 0640 04/09/16 06/28/16 07/19/16  NA  --   < > 141 144 141 143 149* 142  K  --   < > 3.3* 4.5 4.9 4.2 3.9 3.7  CL  --   < > 105 109 108  --   --   --   CO2  --   < > 27 27 23   --   --   --   GLUCOSE  --   < > 87 94 78  --   --   --   BUN  --   < > 7 <5* 9 15 19 21   CREATININE  --   < > 0.84 0.80 0.76 0.9 0.9 0.9  CALCIUM  --   < > 8.7* 8.8* 9.1  --   --   --   MG 1.7  --  1.6* 1.7  --   --   --   --   < > = values in this interval not displayed. Liver Function Tests:  Recent Labs   03/30/16 1225 06/28/16  AST 21 12*  ALT 13* 10  ALKPHOS 63 70  BILITOT 0.8  --   PROT 6.5  --   ALBUMIN 3.5  --    No results for input(s): LIPASE, AMYLASE in the last 8760 hours. No results for input(s): AMMONIA in the last 8760 hours. CBC:  Recent Labs  03/30/16 1225  04/01/16 0541 04/02/16 0753  06/28/16 07/19/16 07/30/16  WBC 4.0  --  5.0 4.1  < > 5.9 4.6 4.6  NEUTROABS 2.0  --   --   --   --   --   --   --   HGB 13.6  < > 12.4 12.6  < > 10.0* 9.8* 9.7*  HCT 41.2  < > 38.3 37.6  < > 31* 32* 31*  MCV 89.8  --  89.7 90.6  --   --   --   --   PLT 150  --  174 158  < > 227 216 200  < > = values in this interval not displayed. TSH:  Recent Labs  03/30/16 1717  TSH 3.033   A1C: No results found for: HGBA1C Lipid Panel:  Recent Labs  06/28/16  CHOL 132  HDL 42  LDLCALC 81  TRIG 46   Iron total 07/19/16: 49 TIBC  07/19/16:  264 Calcium 07/19/16: 9.0 Vitamin B12 07/30/16: Vitamin B12: 407 Ferritin 07/30/16:  19  FOB 07/30/16: Negative  Assessment/Plan 1. Other insomnia Improved on melatonin and zoloft   2. Seizure ([place]) No recurrent seizures, conts on keppra  3. Essential hypertension Blood pressure remains stable, conts on coreg, lisinopril  4. Depression, unspecified depression type Mood has been good. Improved on zoloft 50 mg daily   5. Acute blood loss anemia - no bleeding at this time, will follow up cbc next month.   Carlos American. Harle Battiest  [facility] & [facility] Medicine [phone] 8 am - 5 pm) [phone] (after hours)

## 2016-09-24 DIAGNOSIS — G8194 Hemiplegia, unspecified affecting left nondominant side: Secondary | ICD-10-CM | POA: Diagnosis not present

## 2016-09-24 DIAGNOSIS — R293 Abnormal posture: Secondary | ICD-10-CM | POA: Diagnosis not present

## 2016-09-27 DIAGNOSIS — R05 Cough: Secondary | ICD-10-CM | POA: Diagnosis not present

## 2016-09-27 DIAGNOSIS — R0989 Other specified symptoms and signs involving the circulatory and respiratory systems: Secondary | ICD-10-CM | POA: Diagnosis not present

## 2016-10-11 DIAGNOSIS — R1311 Dysphagia, oral phase: Secondary | ICD-10-CM | POA: Diagnosis not present

## 2016-10-11 DIAGNOSIS — I6990 Unspecified sequelae of unspecified cerebrovascular disease: Secondary | ICD-10-CM | POA: Diagnosis not present

## 2016-10-28 ENCOUNTER — Non-Acute Institutional Stay (SKILLED_NURSING_FACILITY): Payer: Medicare Other | Admitting: Nurse Practitioner

## 2016-10-28 ENCOUNTER — Encounter: Payer: Self-pay | Admitting: Nurse Practitioner

## 2016-10-28 DIAGNOSIS — F0390 Unspecified dementia without behavioral disturbance: Secondary | ICD-10-CM | POA: Diagnosis not present

## 2016-10-28 DIAGNOSIS — N859 Noninflammatory disorder of uterus, unspecified: Secondary | ICD-10-CM

## 2016-10-28 DIAGNOSIS — R569 Unspecified convulsions: Secondary | ICD-10-CM

## 2016-10-28 DIAGNOSIS — I10 Essential (primary) hypertension: Secondary | ICD-10-CM

## 2016-10-28 DIAGNOSIS — N858 Other specified noninflammatory disorders of uterus: Secondary | ICD-10-CM

## 2016-10-28 DIAGNOSIS — E785 Hyperlipidemia, unspecified: Secondary | ICD-10-CM | POA: Diagnosis not present

## 2016-10-28 NOTE — Progress Notes (Signed)
? ?>1 ?   Nursing Home Location:  [facility]  Place of Service: SNF (31)  PCP: Unice Cobble, MD  No Known Allergies  Chief Complaint  Patient presents with  . Medical Management of Chronic Issues    Routine Visit    HPI:  Patient is a 75 y.o. female seen today at [facility] for routine follow up. Pt with hx of dementia, seizures, htn, CVA, hyperlipidemia, vaginal bleeding due to uterine mass.  There has been no acute issues in the last month. Pt reports she is sleeping well. No increase in behaviors or mood disorder. Staff has no acute concerns at this time.   Review of Systems:  Review of Systems  Constitutional: Negative for activity change, appetite change, fatigue and unexpected weight change.  HENT: Negative for congestion and hearing loss.   Eyes: Negative.   Respiratory: Negative for cough and shortness of breath.   Cardiovascular: Negative for chest pain, palpitations and leg swelling.  Gastrointestinal: Negative for abdominal pain, constipation and diarrhea.  Genitourinary: Negative for difficulty urinating, dysuria and vaginal bleeding.  Musculoskeletal: Negative for arthralgias and myalgias.  Skin: Negative for color change and wound.  Neurological: Negative for dizziness and weakness.  Psychiatric/Behavioral: Positive for confusion. Negative for agitation and behavioral problems.    Past Medical History:  Diagnosis Date  . Alzheimer disease   . 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 CAD (coronary artery disease)   . CORONARY ARTERY DISEASE 02/14/2007  . CVA 02/10/2008  . DEMENTIA 01/20/2010  . Edema   . ENDOCARDITIS 09/29/2010  . GERD (gastroesophageal reflux disease)   . Glaucoma   . Gout   . HYPERLIPIDEMIA 02/14/2007  . HYPERTENSION 02/14/2007  . Lack of coordination   . Morbid obesity ([place])   . Myocardial infarct   . MYOCARDIAL INFARCTION, HX OF 02/14/2007  . OBESITY 01/16/2009  . OBSTRUCTIVE SLEEP APNEA 05/27/2007  . OSTEOARTHRITIS, KNEES,  BILATERAL 05/01/2009  . Postmenopausal bleeding   . Unspecified urinary incontinence    Past Surgical History:  Procedure Laterality Date  . TUBAL LIGATION     Social History:   reports that she has never smoked. She has never used smokeless tobacco. She reports that she does not drink alcohol or use drugs.  Family History  Problem Relation Age of Onset  . Heart failure Mother     Deceased  . Cancer Mother   . Heart disease Father     Deceased  . Kidney disease Brother     Deceased  . Stroke Sister   . Hypertension Brother   . Hypertension Sister   . Heart disease Sister     Deceased  . Hypertension Sister     x2  . Hypertension Son     x2  . Glaucoma Son     Medications: Patient's Medications  New Prescriptions   No medications on file  Previous Medications   ACETAMINOPHEN (TYLENOL) 325 MG TABLET    Take 2 tablets (650 mg total) by mouth every 6 (six) hours as needed for mild pain (or Fever >/= 101).   ASPIRIN ([employer] CHILDRENS ASPIRIN) 81 MG CHEWABLE TABLET    TAKE 1 TABLET (81 MG TOTAL) BY MOUTH DAILY.   ATORVASTATIN (LIPITOR) 40 MG TABLET    Take 1 tablet (40 mg total) by mouth daily.   CALCIUM-VITAMIN D 600-200 MG-UNIT TABLET    Take 1 tablet by mouth daily.   CARVEDILOL (COREG) 25 MG TABLET    TAKE 1  TABLET BY MOUTH TWICE A DAY WITH A MEAL   DONEPEZIL (ARICEPT) 10 MG TABLET    Take 1 tablet (10 mg total) by mouth at bedtime.   FUROSEMIDE (LASIX) 40 MG TABLET    Take 1 tablet (40 mg total) by mouth daily.   LATANOPROST (XALATAN) 0.005 % OPHTHALMIC SOLUTION    Place 1 drop into both eyes at bedtime.   LEVETIRACETAM (KEPPRA) 500 MG TABLET    Take 1 tablet (500 mg total) by mouth 2 (two) times daily.   LISINOPRIL (PRINIVIL,ZESTRIL) 40 MG TABLET    Take 1 tablet (40 mg total) by mouth daily.   MELATONIN 3 MG TABS    Take 1 tablet by mouth at bedtime.   MEMANTINE (NAMENDA XR) 28 MG CP24 24 HR CAPSULE    Take 28 mg by mouth daily.   SERTRALINE (ZOLOFT) 50 MG TABLET    Take  50 mg by mouth at bedtime.  Modified Medications   No medications on file  Discontinued Medications   No medications on file     Physical Exam: Vitals:   10/28/16 1324  BP: 132/76  Pulse: 70  Resp: 20  Temp: 98.1 F (36.7 C)  Weight: 198 lb 6.4 oz (90 kg)  Height: 5\' 2"  (1.575 m)    Physical Exam  Constitutional: She appears well-developed and well-nourished. No distress.  HENT:  Head: Normocephalic and atraumatic.  Mouth/Throat: Oropharynx is clear and moist. No oropharyngeal exudate.  Eyes: Conjunctivae are normal. Pupils are equal, round, and reactive to light.  Neck: Normal range of motion. Neck supple.  Cardiovascular: Normal rate, regular rhythm and normal heart sounds.   Pulmonary/Chest: Effort normal and breath sounds normal.  Abdominal: Soft. Bowel sounds are normal.  Musculoskeletal: She exhibits no edema or tenderness.  Neurological: She is alert.  Alert and Oriented to person only  Left sided hemiparesis   Skin: Skin is warm and dry. She is not diaphoretic.  Psychiatric: She has a normal mood and affect.    Labs reviewed: Basic Metabolic Panel:  Recent Labs  03/30/16 1713  04/01/16 0541 04/02/16 0625 04/03/16 0640 04/09/16 06/28/16 07/19/16  NA  --   < > 141 144 141 143 149* 142  K  --   < > 3.3* 4.5 4.9 4.2 3.9 3.7  CL  --   < > 105 109 108  --   --   --   CO2  --   < > 27 27 23   --   --   --   GLUCOSE  --   < > 87 94 78  --   --   --   BUN  --   < > 7 <5* 9 15 19 21   CREATININE  --   < > 0.84 0.80 0.76 0.9 0.9 0.9  CALCIUM  --   < > 8.7* 8.8* 9.1  --   --   --   MG 1.7  --  1.6* 1.7  --   --   --   --   < > = values in this interval not displayed. Liver Function Tests:  Recent Labs  03/30/16 1225 06/28/16  AST 21 12*  ALT 13* 10  ALKPHOS 63 70  BILITOT 0.8  --   PROT 6.5  --   ALBUMIN 3.5  --    No results for input(s): LIPASE, AMYLASE in the last 8760 hours. No results for input(s): AMMONIA in the last 8760 hours. CBC:  Recent  Labs  03/30/16 1225  04/01/16 0541 04/02/16 0753  06/28/16 07/19/16 07/30/16  WBC 4.0  --  5.0 4.1  < > 5.9 4.6 4.6  NEUTROABS 2.0  --   --   --   --   --   --   --   HGB 13.6  < > 12.4 12.6  < > 10.0* 9.8* 9.7*  HCT 41.2  < > 38.3 37.6  < > 31* 32* 31*  MCV 89.8  --  89.7 90.6  --   --   --   --   PLT 150  --  174 158  < > 227 216 200  < > = values in this interval not displayed. TSH:  Recent Labs  03/30/16 1717  TSH 3.033   A1C: No results found for: HGBA1C Lipid Panel:  Recent Labs  06/28/16  CHOL 132  HDL 42  LDLCALC 81  TRIG 46   Iron total 07/19/16: 49 TIBC  07/19/16:  264 Calcium 07/19/16: 9.0 Vitamin B12 07/30/16: Vitamin B12: 407 Ferritin 07/30/16:  19  FOB 07/30/16: Negative  Assessment/Plan 1. Uterine mass With occasional bleeding, no recent vaginal bleeding noted. Will follow up CBC  2. Seizure ([place]) No noted seizures, conts on keppra 500 mg BID  3. Hyperlipidemia LDL goal <100 LDL at goal in July, conts on lipitor 40 mg daily   4. Dementia without behavioral disturbance, unspecified dementia type Stable, without significant decline in functional or cogntive status in the last month. conts on namenda and aricept  5. Essential hypertension Blood pressure stable, will cont current regimen, will follow up cmp next lab day   Irma Delancey K. Harle Battiest  [facility] & [facility] Medicine [phone] 8 am - 5 pm) [phone] (after hours)

## 2016-10-29 LAB — HEPATIC FUNCTION PANEL
ALT: 11 U/L (ref 7–35)
AST: 14 U/L (ref 13–35)
Alkaline Phosphatase: 84 U/L (ref 25–125)
Bilirubin, Total: 0.2 mg/dL

## 2016-10-29 LAB — CBC AND DIFFERENTIAL
HEMATOCRIT: 36 % (ref 36–46)
Hemoglobin: 10.4 g/dL — AB (ref 12.0–16.0)
PLATELETS: 147 10*3/uL — AB (ref 150–399)
WBC: 5.3 10*3/mL

## 2016-10-29 LAB — BASIC METABOLIC PANEL
BUN: 21 mg/dL (ref 4–21)
CREATININE: 0.7 mg/dL (ref 0.5–1.1)
GLUCOSE: 94 mg/dL
POTASSIUM: 3.8 mmol/L (ref 3.4–5.3)
SODIUM: 146 mmol/L (ref 137–147)

## 2016-11-20 ENCOUNTER — Non-Acute Institutional Stay (SKILLED_NURSING_FACILITY): Payer: Medicare Other | Admitting: Nurse Practitioner

## 2016-11-20 ENCOUNTER — Encounter: Payer: Self-pay | Admitting: Nurse Practitioner

## 2016-11-20 DIAGNOSIS — D62 Acute posthemorrhagic anemia: Secondary | ICD-10-CM | POA: Diagnosis not present

## 2016-11-20 DIAGNOSIS — F0390 Unspecified dementia without behavioral disturbance: Secondary | ICD-10-CM

## 2016-11-20 DIAGNOSIS — I10 Essential (primary) hypertension: Secondary | ICD-10-CM

## 2016-11-20 DIAGNOSIS — F329 Major depressive disorder, single episode, unspecified: Secondary | ICD-10-CM | POA: Diagnosis not present

## 2016-11-20 DIAGNOSIS — F32A Depression, unspecified: Secondary | ICD-10-CM

## 2016-11-20 NOTE — Progress Notes (Signed)
? ?>1 ?   Nursing Home Location:  [facility]  Place of Service: SNF (31)  PCP: Unice Cobble, MD  No Known Allergies  Chief Complaint  Patient presents with  . Medical Management of Chronic Issues    Routine Visit    HPI:  Patient is a 75 y.o. female seen today at [facility] for routine follow up. Pt with hx of dementia, seizures, htn, CVA, hyperlipidemia, vaginal bleeding due to uterine mass.  Pt has been doing well in the last month. No acute issues. Anemia has improved on recent labs. Staff has no concerns. No new or worsening behaviors. Pt without complaints at this time.   Review of Systems:  Review of Systems  Constitutional: Negative for activity change, appetite change, fatigue and unexpected weight change.  HENT: Negative for congestion and hearing loss.   Eyes: Negative.   Respiratory: Negative for cough and shortness of breath.   Cardiovascular: Negative for chest pain, palpitations and leg swelling.  Gastrointestinal: Negative for abdominal pain, constipation and diarrhea.  Genitourinary: Negative for difficulty urinating, dysuria and vaginal bleeding.  Musculoskeletal: Negative for arthralgias and myalgias.  Skin: Negative for color change and wound.  Neurological: Negative for dizziness and weakness.  Psychiatric/Behavioral: Positive for confusion. Negative for agitation and behavioral problems.    Past Medical History:  Diagnosis Date  . Alzheimer disease   . 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 CAD (coronary artery disease)   . CORONARY ARTERY DISEASE 02/14/2007  . CVA 02/10/2008  . DEMENTIA 01/20/2010  . Edema   . ENDOCARDITIS 09/29/2010  . GERD (gastroesophageal reflux disease)   . Glaucoma   . Gout   . HYPERLIPIDEMIA 02/14/2007  . HYPERTENSION 02/14/2007  . Lack of coordination   . Morbid obesity ([place])   . Myocardial infarct   . MYOCARDIAL INFARCTION, HX OF 02/14/2007  . OBESITY 01/16/2009  . OBSTRUCTIVE SLEEP APNEA 05/27/2007  .  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 Postmenopausal bleeding   . Unspecified urinary incontinence    Past Surgical History:  Procedure Laterality Date  . TUBAL LIGATION     Social History:   reports that she has never smoked. She has never used smokeless tobacco. She reports that she does not drink alcohol or use drugs.  Family History  Problem Relation Age of Onset  . Heart failure Mother     Deceased  . Cancer Mother   . Heart disease Father     Deceased  . Kidney disease Brother     Deceased  . Stroke Sister   . Hypertension Brother   . Hypertension Sister   . Heart disease Sister     Deceased  . Hypertension Sister     x2  . Hypertension Son     x2  . Glaucoma Son     Medications: Patient's Medications  New Prescriptions   No medications on file  Previous Medications   ACETAMINOPHEN (TYLENOL) 325 MG TABLET    Take 2 tablets (650 mg total) by mouth every 6 (six) hours as needed for mild pain (or Fever >/= 101).   ASPIRIN ([employer] CHILDRENS ASPIRIN) 81 MG CHEWABLE TABLET    TAKE 1 TABLET (81 MG TOTAL) BY MOUTH DAILY.   ATORVASTATIN (LIPITOR) 40 MG TABLET    Take 1 tablet (40 mg total) by mouth daily.   CALCIUM-VITAMIN D 600-200 MG-UNIT TABLET    Take 1 tablet by mouth daily.   CARVEDILOL (COREG) 25 MG TABLET  TAKE 1 TABLET BY MOUTH TWICE A DAY WITH A MEAL   DONEPEZIL (ARICEPT) 10 MG TABLET    Take 1 tablet (10 mg total) by mouth at bedtime.   FUROSEMIDE (LASIX) 40 MG TABLET    Take 1 tablet (40 mg total) by mouth daily.   LATANOPROST (XALATAN) 0.005 % OPHTHALMIC SOLUTION    Place 1 drop into both eyes at bedtime.   LEVETIRACETAM (KEPPRA) 500 MG TABLET    Take 1 tablet (500 mg total) by mouth 2 (two) times daily.   LISINOPRIL (PRINIVIL,ZESTRIL) 40 MG TABLET    Take 1 tablet (40 mg total) by mouth daily.   MELATONIN 3 MG TABS    Take 1 tablet by mouth at bedtime.   MEMANTINE (NAMENDA XR) 28 MG CP24 24 HR CAPSULE    Take 28 mg by mouth daily.   SERTRALINE  (ZOLOFT) 50 MG TABLET    Take 50 mg by mouth at bedtime.  Modified Medications   No medications on file  Discontinued Medications   No medications on file     Physical Exam: Vitals:   11/20/16 1040  BP: 132/78  Pulse: 65  Resp: 18  Temp: 98.1 F (36.7 C)  Weight: 198 lb (89.8 kg)  Height: 5\' 2"  (1.575 m)    Physical Exam  Constitutional: She appears well-developed and well-nourished. No distress.  HENT:  Head: Normocephalic and atraumatic.  Mouth/Throat: Oropharynx is clear and moist. No oropharyngeal exudate.  Eyes: Conjunctivae are normal. Pupils are equal, round, and reactive to light.  blind  Neck: Normal range of motion. Neck supple.  Cardiovascular: Normal rate, regular rhythm and normal heart sounds.   Pulmonary/Chest: Effort normal and breath sounds normal.  Abdominal: Soft. Bowel sounds are normal.  Musculoskeletal: She exhibits no edema or tenderness.  Neurological: She is alert.  Alert and Oriented to person only  Left sided hemiparesis   Skin: Skin is warm and dry. She is not diaphoretic.  Psychiatric: She has a normal mood and affect.    Labs reviewed: Basic Metabolic Panel:  Recent Labs  03/30/16 1713  04/01/16 0541 04/02/16 0625 04/03/16 0640  06/28/16 07/19/16 10/29/16  NA  --   < > 141 144 141  < > 149* 142 146  K  --   < > 3.3* 4.5 4.9  < > 3.9 3.7 3.8  CL  --   < > 105 109 108  --   --   --   --   CO2  --   < > 27 27 23   --   --   --   --   GLUCOSE  --   < > 87 94 78  --   --   --   --   BUN  --   < > 7 <5* 9  < > 19 21 21   CREATININE  --   < > 0.84 0.80 0.76  < > 0.9 0.9 0.7  CALCIUM  --   < > 8.7* 8.8* 9.1  --   --   --   --   MG 1.7  --  1.6* 1.7  --   --   --   --   --   < > = values in this interval not displayed. Liver Function Tests:  Recent Labs  03/30/16 1225 06/28/16 10/29/16  AST 21 12* 14  ALT 13* 10 11  ALKPHOS 63 70 84  BILITOT 0.8  --   --   PROT  6.5  --   --   ALBUMIN 3.5  --   --    No results for input(s):  LIPASE, AMYLASE in the last 8760 hours. No results for input(s): AMMONIA in the last 8760 hours. CBC:  Recent Labs  03/30/16 1225  04/01/16 0541 04/02/16 0753  07/19/16 07/30/16 10/29/16  WBC 4.0  --  5.0 4.1  < > 4.6 4.6 5.3  NEUTROABS 2.0  --   --   --   --   --   --   --   HGB 13.6  < > 12.4 12.6  < > 9.8* 9.7* 10.4*  HCT 41.2  < > 38.3 37.6  < > 32* 31* 36  MCV 89.8  --  89.7 90.6  --   --   --   --   PLT 150  --  174 158  < > 216 200 147*  < > = values in this interval not displayed. TSH:  Recent Labs  03/30/16 1717  TSH 3.033   A1C: No results found for: HGBA1C Lipid Panel:  Recent Labs  06/28/16  CHOL 132  HDL 42  LDLCALC 81  TRIG 46   Iron total 07/19/16: 49 TIBC  07/19/16:  264 Calcium 07/19/16: 9.0 Vitamin B12 07/30/16: Vitamin B12: 407 Ferritin 07/30/16:  19  FOB 07/30/16: Negative  Assessment/Plan 1. Acute blood loss anemia hgb stable at this time. Will cont to montior  2. Dementia without behavioral disturbance, unspecified dementia type Stable, no acute decline in cognitive or behaviors status. Cont on aricept and namenda   3. Depression, unspecified depression type Mood is stable. Doing well on zoloft 50 mg   4. Essential hypertension Blood pressure controlled on coreg, lisinopril and lasix    Adair Lemar K. Harle Battiest  [facility] [phone] 8 am - 5 pm) [phone] (after hours)

## 2016-12-16 ENCOUNTER — Encounter: Payer: Self-pay | Admitting: Nurse Practitioner

## 2016-12-16 ENCOUNTER — Non-Acute Institutional Stay (SKILLED_NURSING_FACILITY): Payer: Medicare Other | Admitting: Nurse Practitioner

## 2016-12-16 DIAGNOSIS — E785 Hyperlipidemia, unspecified: Secondary | ICD-10-CM | POA: Diagnosis not present

## 2016-12-16 DIAGNOSIS — F0390 Unspecified dementia without behavioral disturbance: Secondary | ICD-10-CM

## 2016-12-16 DIAGNOSIS — I10 Essential (primary) hypertension: Secondary | ICD-10-CM

## 2016-12-16 DIAGNOSIS — IMO0002 Reserved for concepts with insufficient information to code with codable children: Secondary | ICD-10-CM

## 2016-12-16 DIAGNOSIS — M1710 Unilateral primary osteoarthritis, unspecified knee: Secondary | ICD-10-CM | POA: Diagnosis not present

## 2016-12-16 DIAGNOSIS — I2510 Atherosclerotic heart disease of native coronary artery without angina pectoris: Secondary | ICD-10-CM | POA: Diagnosis not present

## 2016-12-16 NOTE — Progress Notes (Signed)
? ?>1 ?   Nursing Home Location:  [facility]  Place of Service: SNF (31)  PCP: Unice Cobble, MD  No Known Allergies  Chief Complaint  Patient presents with  . Medical Management of Chronic Issues    Routine Visit    HPI:  Patient is a 76 y.o. female seen today at [facility] for routine follow up. Pt with hx of dementia, seizures, htn, CVA, hyperlipidemia, vaginal bleeding due to uterine mass.  Pt has been doing well in the last month. Staff reports no concerns or acute issues in the last month. Pt has not had any vaginal bleeding noted. Good appetite per pt and staff. Pt without increase in anxiety or depression.   Review of Systems:  Review of Systems  Constitutional: Negative for activity change, appetite change, fatigue and unexpected weight change.  HENT: Negative for congestion and hearing loss.   Eyes: Negative.   Respiratory: Negative for cough and shortness of breath.   Cardiovascular: Negative for chest pain, palpitations and leg swelling.  Gastrointestinal: Negative for abdominal pain, constipation and diarrhea.  Genitourinary: Negative for difficulty urinating, dysuria and vaginal bleeding.  Musculoskeletal: Negative for arthralgias and myalgias.  Skin: Negative for color change and wound.  Neurological: Negative for dizziness and weakness.  Psychiatric/Behavioral: Positive for confusion. Negative for agitation and behavioral problems.    Past Medical History:  Diagnosis Date  . Alzheimer disease   . 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 CAD (coronary artery disease)   . CORONARY ARTERY DISEASE 02/14/2007  . CVA 02/10/2008  . DEMENTIA 01/20/2010  . Edema   . ENDOCARDITIS 09/29/2010  . GERD (gastroesophageal reflux disease)   . Glaucoma   . Gout   . HYPERLIPIDEMIA 02/14/2007  . HYPERTENSION 02/14/2007  . Lack of coordination   . Morbid obesity ([place])   . Myocardial infarct   . MYOCARDIAL INFARCTION, HX OF 02/14/2007  . OBESITY 01/16/2009  . OBSTRUCTIVE  SLEEP APNEA 05/27/2007  . 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 Postmenopausal bleeding   . Unspecified urinary incontinence    Past Surgical History:  Procedure Laterality Date  . TUBAL LIGATION     Social History:   reports that she has never smoked. She has never used smokeless tobacco. She reports that she does not drink alcohol or use drugs.  Family History  Problem Relation Age of Onset  . Heart failure Mother     Deceased  . Cancer Mother   . Heart disease Father     Deceased  . Kidney disease Brother     Deceased  . Stroke Sister   . Hypertension Brother   . Hypertension Sister   . Heart disease Sister     Deceased  . Hypertension Sister     x2  . Hypertension Son     x2  . Glaucoma Son     Medications: Patient's Medications  New Prescriptions   No medications on file  Previous Medications   ACETAMINOPHEN (TYLENOL) 325 MG TABLET    Take 2 tablets (650 mg total) by mouth every 6 (six) hours as needed for mild pain (or Fever >/= 101).   ASPIRIN ([employer] CHILDRENS ASPIRIN) 81 MG CHEWABLE TABLET    TAKE 1 TABLET (81 MG TOTAL) BY MOUTH DAILY.   ATORVASTATIN (LIPITOR) 40 MG TABLET    Take 1 tablet (40 mg total) by mouth daily.   CALCIUM-VITAMIN D 600-200 MG-UNIT TABLET    Take 1 tablet by mouth daily.  CARVEDILOL (COREG) 25 MG TABLET    TAKE 1 TABLET BY MOUTH TWICE A DAY WITH A MEAL   DONEPEZIL (ARICEPT) 10 MG TABLET    Take 1 tablet (10 mg total) by mouth at bedtime.   FUROSEMIDE (LASIX) 40 MG TABLET    Take 1 tablet (40 mg total) by mouth daily.   LATANOPROST (XALATAN) 0.005 % OPHTHALMIC SOLUTION    Place 1 drop into both eyes at bedtime.   LEVETIRACETAM (KEPPRA) 500 MG TABLET    Take 1 tablet (500 mg total) by mouth 2 (two) times daily.   LISINOPRIL (PRINIVIL,ZESTRIL) 40 MG TABLET    Take 1 tablet (40 mg total) by mouth daily.   MELATONIN 3 MG TABS    Take 1 tablet by mouth at bedtime.   MEMANTINE (NAMENDA XR) 28 MG CP24 24 HR CAPSULE    Take 28 mg by mouth  daily.   SERTRALINE (ZOLOFT) 50 MG TABLET    Take 50 mg by mouth at bedtime.  Modified Medications   No medications on file  Discontinued Medications   No medications on file     Physical Exam: Vitals:   12/16/16 1128  BP: (!) 144/73  Pulse: 82  Resp: 18  Temp: 98.6 F (37 C)  SpO2: 92%  Weight: 204 lb 9.6 oz (92.8 kg)  Height: 5\' 2"  (1.575 m)    Physical Exam  Constitutional: She appears well-developed and well-nourished. No distress.  HENT:  Head: Normocephalic and atraumatic.  Mouth/Throat: Oropharynx is clear and moist. No oropharyngeal exudate.  Eyes: Conjunctivae are normal. Pupils are equal, round, and reactive to light.  blind  Neck: Normal range of motion. Neck supple.  Cardiovascular: Normal rate, regular rhythm and normal heart sounds.   Pulmonary/Chest: Effort normal and breath sounds normal.  Abdominal: Soft. Bowel sounds are normal.  Musculoskeletal: She exhibits no edema or tenderness.  Neurological: She is alert.  Alert and Oriented to person only  Left sided hemiparesis   Skin: Skin is warm and dry. She is not diaphoretic.  Psychiatric: She has a normal mood and affect.    Labs reviewed: Basic Metabolic Panel:  Recent Labs  03/30/16 1713  04/01/16 0541 04/02/16 0625 04/03/16 0640  06/28/16 07/19/16 10/29/16  NA  --   < > 141 144 141  < > 149* 142 146  K  --   < > 3.3* 4.5 4.9  < > 3.9 3.7 3.8  CL  --   < > 105 109 108  --   --   --   --   CO2  --   < > 27 27 23   --   --   --   --   GLUCOSE  --   < > 87 94 78  --   --   --   --   BUN  --   < > 7 <5* 9  < > 19 21 21   CREATININE  --   < > 0.84 0.80 0.76  < > 0.9 0.9 0.7  CALCIUM  --   < > 8.7* 8.8* 9.1  --   --   --   --   MG 1.7  --  1.6* 1.7  --   --   --   --   --   < > = values in this interval not displayed. Liver Function Tests:  Recent Labs  03/30/16 1225 06/28/16 10/29/16  AST 21 12* 14  ALT 13* 10 11  ALKPHOS  63 70 84  BILITOT 0.8  --   --   PROT 6.5  --   --   ALBUMIN 3.5   --   --    No results for input(s): LIPASE, AMYLASE in the last 8760 hours. No results for input(s): AMMONIA in the last 8760 hours. CBC:  Recent Labs  03/30/16 1225  04/01/16 0541 04/02/16 0753  07/19/16 07/30/16 10/29/16  WBC 4.0  --  5.0 4.1  < > 4.6 4.6 5.3  NEUTROABS 2.0  --   --   --   --   --   --   --   HGB 13.6  < > 12.4 12.6  < > 9.8* 9.7* 10.4*  HCT 41.2  < > 38.3 37.6  < > 32* 31* 36  MCV 89.8  --  89.7 90.6  --   --   --   --   PLT 150  --  174 158  < > 216 200 147*  < > = values in this interval not displayed. TSH:  Recent Labs  03/30/16 1717  TSH 3.033   A1C: No results found for: HGBA1C Lipid Panel:  Recent Labs  06/28/16  CHOL 132  HDL 42  LDLCALC 81  TRIG 46   Iron total 07/19/16: 49 TIBC  07/19/16:  264 Calcium 07/19/16: 9.0 Vitamin B12 07/30/16: Vitamin B12: 407 Ferritin 07/30/16:  19  FOB 07/30/16: Negative  Assessment/Plan 1. Essential hypertension Blood pressure stable, conts on lisinopril and coreg.   2. Atherosclerosis of coronary artery of native heart without angina pectoris, unspecified vessel or lesion type Without chest pains. conts on ASA 81 mg daily   3. Dementia without behavioral disturbance, unspecified dementia type Advanced dementia, without acute worsening of cognitive or behavioral status. conts on namenda and aricept.   4. Osteoarthrosis involving lower leg Without increase in pain. Cont son tylenol PRN   5. Hyperlipidemia LDL goal <100 LDL at goal in July 2017, conts on lipitor daily    Carlos American. Harle Battiest  [facility] & [facility] [phone] 8 am - 5 pm) [phone] (after hours)

## 2017-01-14 DIAGNOSIS — M6281 Muscle weakness (generalized): Secondary | ICD-10-CM | POA: Diagnosis not present

## 2017-01-14 DIAGNOSIS — I6990 Unspecified sequelae of unspecified cerebrovascular disease: Secondary | ICD-10-CM | POA: Diagnosis not present

## 2017-01-15 DIAGNOSIS — M6281 Muscle weakness (generalized): Secondary | ICD-10-CM | POA: Diagnosis not present

## 2017-01-15 DIAGNOSIS — I6990 Unspecified sequelae of unspecified cerebrovascular disease: Secondary | ICD-10-CM | POA: Diagnosis not present

## 2017-01-18 ENCOUNTER — Telehealth: Payer: Self-pay | Admitting: Internal Medicine

## 2017-01-18 DIAGNOSIS — I6990 Unspecified sequelae of unspecified cerebrovascular disease: Secondary | ICD-10-CM | POA: Diagnosis not present

## 2017-01-18 DIAGNOSIS — M6281 Muscle weakness (generalized): Secondary | ICD-10-CM | POA: Diagnosis not present

## 2017-01-18 NOTE — Telephone Encounter (Signed)
Pt daughter called in said that pt was put in [facility] back in may and not they have stopped her SS# check and they are needing a letter stating they pt is not capable of handling money.  Daughter needs to be payee on her SS# so she can see why they stopped her check.  Can this be done?    Will pt needs to be seen ?   Best number [phone] Daughter Lenna Sciara

## 2017-01-18 NOTE — Telephone Encounter (Signed)
Patients daughter contacted and is going to call back nursing home to get Linna Darner to sign form, stated to patient to call back and make an appointment if needed.

## 2017-01-18 NOTE — Telephone Encounter (Signed)
She has not had me listed as PCP for more than 1 year. Would need visit. Can her current PCP handle?

## 2017-01-19 DIAGNOSIS — I6990 Unspecified sequelae of unspecified cerebrovascular disease: Secondary | ICD-10-CM | POA: Diagnosis not present

## 2017-01-19 DIAGNOSIS — M6281 Muscle weakness (generalized): Secondary | ICD-10-CM | POA: Diagnosis not present

## 2017-01-20 DIAGNOSIS — I6990 Unspecified sequelae of unspecified cerebrovascular disease: Secondary | ICD-10-CM | POA: Diagnosis not present

## 2017-01-20 DIAGNOSIS — M6281 Muscle weakness (generalized): Secondary | ICD-10-CM | POA: Diagnosis not present

## 2017-01-21 ENCOUNTER — Encounter: Payer: Self-pay | Admitting: Nurse Practitioner

## 2017-01-21 ENCOUNTER — Non-Acute Institutional Stay (SKILLED_NURSING_FACILITY): Payer: Medicare Other | Admitting: Nurse Practitioner

## 2017-01-21 DIAGNOSIS — N858 Other specified noninflammatory disorders of uterus: Secondary | ICD-10-CM

## 2017-01-21 DIAGNOSIS — F0390 Unspecified dementia without behavioral disturbance: Secondary | ICD-10-CM

## 2017-01-21 DIAGNOSIS — IMO0002 Reserved for concepts with insufficient information to code with codable children: Secondary | ICD-10-CM

## 2017-01-21 DIAGNOSIS — M6281 Muscle weakness (generalized): Secondary | ICD-10-CM | POA: Diagnosis not present

## 2017-01-21 DIAGNOSIS — I2510 Atherosclerotic heart disease of native coronary artery without angina pectoris: Secondary | ICD-10-CM | POA: Diagnosis not present

## 2017-01-21 DIAGNOSIS — M1710 Unilateral primary osteoarthritis, unspecified knee: Secondary | ICD-10-CM

## 2017-01-21 DIAGNOSIS — N859 Noninflammatory disorder of uterus, unspecified: Secondary | ICD-10-CM | POA: Diagnosis not present

## 2017-01-21 DIAGNOSIS — E785 Hyperlipidemia, unspecified: Secondary | ICD-10-CM

## 2017-01-21 DIAGNOSIS — I10 Essential (primary) hypertension: Secondary | ICD-10-CM | POA: Diagnosis not present

## 2017-01-21 DIAGNOSIS — N950 Postmenopausal bleeding: Secondary | ICD-10-CM

## 2017-01-21 DIAGNOSIS — I6990 Unspecified sequelae of unspecified cerebrovascular disease: Secondary | ICD-10-CM | POA: Diagnosis not present

## 2017-01-21 NOTE — Progress Notes (Signed)
Careteam: Patient Care Team: Hendricks Limes, MD as PCP - General (Internal Medicine)  Advanced Directive information Does Patient Have a Medical Advance Directive?: No  No Known Allergies  Chief Complaint  Patient presents with  . Medical Management of Chronic Issues    Routine Visit     HPI: Patient is a 76 y.o. female seen at [facility] today for routine follow up for chronic conditions. Past medical history includes dementia, seizures, HTN, CVA, HLD, and vaginal bleeding due to uterine mass. Pt doing well over the past month with no new complaints or acute issues per pt and staff. Staff reports no vaginal bleeding or problems with constipation. She reports sleeping well and a good appetite per pt and staff. Weight stable at 204lb. Pt is without increased anxiety or depression.   Review of Systems:  Review of Systems  Constitutional: Negative for activity change, appetite change, chills, fatigue and fever.  HENT: Negative for congestion, rhinorrhea, sinus pain, sinus pressure, sneezing and sore throat.   Respiratory: Negative for cough, choking, shortness of breath and wheezing.   Cardiovascular: Negative for chest pain, palpitations and leg swelling.  Gastrointestinal: Negative for abdominal distention, abdominal pain, constipation, diarrhea, nausea and vomiting.  Genitourinary: Negative for difficulty urinating, dysuria, urgency and vaginal bleeding.  Musculoskeletal: Negative for arthralgias and myalgias.  Skin: Negative for color change and rash.  Neurological: Negative for dizziness, weakness and headaches.  Psychiatric/Behavioral: Positive for confusion. Negative for agitation and behavioral problems.    Past Medical History:  Diagnosis Date  . Alzheimer disease   . 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 CAD (coronary artery disease)   . CORONARY ARTERY DISEASE 02/14/2007  . CVA 02/10/2008  . DEMENTIA 01/20/2010  . Edema   . ENDOCARDITIS 09/29/2010  . GERD (gastroesophageal  reflux disease)   . Glaucoma   . Gout   . HYPERLIPIDEMIA 02/14/2007  . HYPERTENSION 02/14/2007  . Lack of coordination   . Morbid obesity ([place])   . Myocardial infarct   . MYOCARDIAL INFARCTION, HX OF 02/14/2007  . OBESITY 01/16/2009  . OBSTRUCTIVE SLEEP APNEA 05/27/2007  . 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 Postmenopausal bleeding   . Unspecified urinary incontinence    Past Surgical History:  Procedure Laterality Date  . TUBAL LIGATION     Social History:   reports that she has never smoked. She has never used smokeless tobacco. She reports that she does not drink alcohol or use drugs.  Family History  Problem Relation Age of Onset  . Heart failure Mother     Deceased  . Cancer Mother   . Heart disease Father     Deceased  . Kidney disease Brother     Deceased  . Stroke Sister   . Hypertension Brother   . Hypertension Sister   . Heart disease Sister     Deceased  . Hypertension Sister     x2  . Hypertension Son     x2  . Glaucoma Son     Medications: Patient's Medications  New Prescriptions   No medications on file  Previous Medications   ACETAMINOPHEN (TYLENOL) 325 MG TABLET    Take 2 tablets (650 mg total) by mouth every 6 (six) hours as needed for mild pain (or Fever >/= 101).   ASPIRIN ([employer] CHILDRENS ASPIRIN) 81 MG CHEWABLE TABLET    TAKE 1 TABLET (81 MG TOTAL) BY MOUTH DAILY.   ATORVASTATIN (LIPITOR) 40 MG TABLET    Take 1  tablet (40 mg total) by mouth daily.   CALCIUM-VITAMIN D 600-200 MG-UNIT TABLET    Take 1 tablet by mouth daily.   CARVEDILOL (COREG) 25 MG TABLET    TAKE 1 TABLET BY MOUTH TWICE A DAY WITH A MEAL   DONEPEZIL (ARICEPT) 10 MG TABLET    Take 1 tablet (10 mg total) by mouth at bedtime.   FUROSEMIDE (LASIX) 40 MG TABLET    Take 1 tablet (40 mg total) by mouth daily.   LATANOPROST (XALATAN) 0.005 % OPHTHALMIC SOLUTION    Place 1 drop into both eyes at bedtime.   LEVETIRACETAM (KEPPRA) 500 MG TABLET    Take 1 tablet (500 mg total) by  mouth 2 (two) times daily.   LISINOPRIL (PRINIVIL,ZESTRIL) 40 MG TABLET    Take 1 tablet (40 mg total) by mouth daily.   MELATONIN 3 MG TABS    Take 1 tablet by mouth at bedtime.   MEMANTINE (NAMENDA XR) 28 MG CP24 24 HR CAPSULE    Take 28 mg by mouth daily.   SERTRALINE (ZOLOFT) 50 MG TABLET    Take 50 mg by mouth at bedtime.  Modified Medications   No medications on file  Discontinued Medications   No medications on file     Physical Exam:  Vitals:   01/21/17 0927  BP: 124/76  Pulse: 80  Resp: 18  Temp: 97.9 F (36.6 C)  SpO2: 96%  Weight: 204 lb (92.5 kg)  Height: 5\' 2"  (1.575 m)   Body mass index is 37.31 kg/m.  Physical Exam  Constitutional: She appears well-developed and well-nourished. No distress.  HENT:  Head: Normocephalic and atraumatic.  Eyes: Pupils are equal, round, and reactive to light.  Pt is blind   Neck: Normal range of motion. Neck supple.  Cardiovascular: Normal rate, regular rhythm and normal heart sounds.   No murmur heard. Pulmonary/Chest: Effort normal and breath sounds normal. No respiratory distress. She has no wheezes.  Abdominal: Soft. Bowel sounds are normal. She exhibits no distension. There is no tenderness.  Musculoskeletal: She exhibits no edema or tenderness.  Wheelchair bound  Neurological: She is alert.  Oriented to person only. Left sided hemiparesis  Skin: Skin is warm and dry.  Pt is not diaphoretic  Psychiatric: She has a normal mood and affect. Her behavior is normal.    Labs reviewed: Basic Metabolic Panel:  Recent Labs  03/30/16 1713 03/30/16 1717  04/01/16 0541 04/02/16 0625 04/03/16 0640  06/28/16 07/19/16 10/29/16  NA  --   --   < > 141 144 141  < > 149* 142 146  K  --   --   < > 3.3* 4.5 4.9  < > 3.9 3.7 3.8  CL  --   --   < > 105 109 108  --   --   --   --   CO2  --   --   < > 27 27 23   --   --   --   --   GLUCOSE  --   --   < > 87 94 78  --   --   --   --   BUN  --   --   < > 7 <5* 9  < > 19 21 21     CREATININE  --   --   < > 0.84 0.80 0.76  < > 0.9 0.9 0.7  CALCIUM  --   --   < > 8.7* 8.8*  9.1  --   --   --   --   MG 1.7  --   --  1.6* 1.7  --   --   --   --   --   TSH  --  3.033  --   --   --   --   --   --   --   --   < > = values in this interval not displayed. Liver Function Tests:  Recent Labs  03/30/16 1225 06/28/16 10/29/16  AST 21 12* 14  ALT 13* 10 11  ALKPHOS 63 70 84  BILITOT 0.8  --   --   PROT 6.5  --   --   ALBUMIN 3.5  --   --    No results for input(s): LIPASE, AMYLASE in the last 8760 hours. No results for input(s): AMMONIA in the last 8760 hours. CBC:  Recent Labs  03/30/16 1225  04/01/16 0541 04/02/16 0753  07/19/16 07/30/16 10/29/16  WBC 4.0  --  5.0 4.1  < > 4.6 4.6 5.3  NEUTROABS 2.0  --   --   --   --   --   --   --   HGB 13.6  < > 12.4 12.6  < > 9.8* 9.7* 10.4*  HCT 41.2  < > 38.3 37.6  < > 32* 31* 36  MCV 89.8  --  89.7 90.6  --   --   --   --   PLT 150  --  174 158  < > 216 200 147*  < > = values in this interval not displayed. Lipid Panel:  Recent Labs  06/28/16  CHOL 132  HDL 42  LDLCALC 81  TRIG 46   TSH:  Recent Labs  03/30/16 1717  TSH 3.033   A1C: No results found for: HGBA1C   Assessment/Plan 1. Essential hypertension -BP stable. 124/76. Continue lisinopril and coreg and lasix.    2. Dementia without behavioral disturbance, unspecified dementia type -Advanced dementia without acute or worsening of cognitive or behavioral status. Continues on namenda and aricept.   3. Osteoarthrosis involving lower leg -Stable. Without pain. Continue tylenol as needed  4. Hyperlipidemia LDL goal <100 -LDL at goal. LDL=81 06/2016. Continue on lipitor.   5. Postmenopausal bleeding -Stable. Staff reports no further vaginal bleeding within the past month.   7. Atherosclerosis of coronary artery of native heart without angina pectoris, unspecified vessel or lesion type -Stable. Without chest pain today. Continue on ASA 81mg  daily.     Carlos American. Harle Battiest  [facility] & Adult [facility] [phone] 8 am - 5 pm) [phone] (after hours)

## 2017-01-22 ENCOUNTER — Ambulatory Visit (INDEPENDENT_AMBULATORY_CARE_PROVIDER_SITE_OTHER): Payer: Medicare Other | Admitting: Internal Medicine

## 2017-01-22 ENCOUNTER — Encounter: Payer: Self-pay | Admitting: Internal Medicine

## 2017-01-22 DIAGNOSIS — I2510 Atherosclerotic heart disease of native coronary artery without angina pectoris: Secondary | ICD-10-CM | POA: Diagnosis not present

## 2017-01-22 DIAGNOSIS — M6281 Muscle weakness (generalized): Secondary | ICD-10-CM | POA: Diagnosis not present

## 2017-01-22 DIAGNOSIS — B351 Tinea unguium: Secondary | ICD-10-CM | POA: Diagnosis not present

## 2017-01-22 DIAGNOSIS — G608 Other hereditary and idiopathic neuropathies: Secondary | ICD-10-CM | POA: Diagnosis not present

## 2017-01-22 DIAGNOSIS — I70203 Unspecified atherosclerosis of native arteries of extremities, bilateral legs: Secondary | ICD-10-CM | POA: Diagnosis not present

## 2017-01-22 DIAGNOSIS — I6990 Unspecified sequelae of unspecified cerebrovascular disease: Secondary | ICD-10-CM | POA: Diagnosis not present

## 2017-01-22 DIAGNOSIS — F0390 Unspecified dementia without behavioral disturbance: Secondary | ICD-10-CM

## 2017-01-22 NOTE — Progress Notes (Signed)
   Subjective:    Patient ID: Diane Frey, female    DOB: 25-May-1941, 76 y.o.   MRN: [id number]  HPI The patient is a 76 YO female coming in with her daughter for evaluation of mental facilties. She is not able to manage her finances and has been in the nursing home for almost 1 year now. She has not been able to manage her finances for several years now and her daughter is the POA and has been managing all the finances since that time. She has noticed that her mother's SS income has stopped for some reason and they will not talk to her at all without a note of evaluation from her doctor. She is able to follow 1 step commands but not 2 step commands.   Review of Systems  Unable to perform ROS: Dementia  Skin: Negative.   Neurological:       Memory change  Psychiatric/Behavioral: Positive for decreased concentration. Negative for behavioral problems, dysphoric mood, self-injury and suicidal ideas. The patient is not nervous/anxious.       Objective:   Physical Exam  Constitutional: She appears well-developed and well-nourished.  HENT:  Head: Normocephalic and atraumatic.  Eyes: EOM are normal.  Neck: Normal range of motion.  Cardiovascular: Normal rate and regular rhythm.   Pulmonary/Chest: Effort normal and breath sounds normal.  Abdominal: Soft.  Musculoskeletal: She exhibits no edema.  Neurological: She is alert.  Not able to follow more than 1 step commands and does not participate in the conversation to perform MMSE.    Vitals:   01/22/17 0758  BP: 138/76  Pulse: 60  Temp: 98.7 F (37.1 C)  TempSrc: Oral  SpO2: 98%  Weight: 220 lb (99.8 kg)  Height: 5\' 2"  (1.575 m)      Assessment & Plan:

## 2017-01-22 NOTE — Patient Instructions (Signed)
We have given you the note.

## 2017-01-22 NOTE — Assessment & Plan Note (Signed)
Taking aricept and namenda and stable from prior. She is not capable of managing her finances and will write a note for her daughter so that she can investigate her finances. The patient named her on her bank account and POA when she was able to make this decision.

## 2017-01-22 NOTE — Progress Notes (Signed)
Pre visit review using our clinic review tool, if applicable. No additional management support is needed unless otherwise documented below in the visit note. 

## 2017-01-25 DIAGNOSIS — M6281 Muscle weakness (generalized): Secondary | ICD-10-CM | POA: Diagnosis not present

## 2017-01-25 DIAGNOSIS — I6990 Unspecified sequelae of unspecified cerebrovascular disease: Secondary | ICD-10-CM | POA: Diagnosis not present

## 2017-01-26 DIAGNOSIS — I6990 Unspecified sequelae of unspecified cerebrovascular disease: Secondary | ICD-10-CM | POA: Diagnosis not present

## 2017-01-26 DIAGNOSIS — M6281 Muscle weakness (generalized): Secondary | ICD-10-CM | POA: Diagnosis not present

## 2017-01-27 DIAGNOSIS — I6990 Unspecified sequelae of unspecified cerebrovascular disease: Secondary | ICD-10-CM | POA: Diagnosis not present

## 2017-01-27 DIAGNOSIS — M6281 Muscle weakness (generalized): Secondary | ICD-10-CM | POA: Diagnosis not present

## 2017-01-28 DIAGNOSIS — I6990 Unspecified sequelae of unspecified cerebrovascular disease: Secondary | ICD-10-CM | POA: Diagnosis not present

## 2017-01-28 DIAGNOSIS — M6281 Muscle weakness (generalized): Secondary | ICD-10-CM | POA: Diagnosis not present

## 2017-01-29 DIAGNOSIS — I6990 Unspecified sequelae of unspecified cerebrovascular disease: Secondary | ICD-10-CM | POA: Diagnosis not present

## 2017-01-29 DIAGNOSIS — M6281 Muscle weakness (generalized): Secondary | ICD-10-CM | POA: Diagnosis not present

## 2017-02-01 DIAGNOSIS — M6281 Muscle weakness (generalized): Secondary | ICD-10-CM | POA: Diagnosis not present

## 2017-02-01 DIAGNOSIS — I6990 Unspecified sequelae of unspecified cerebrovascular disease: Secondary | ICD-10-CM | POA: Diagnosis not present

## 2017-02-02 DIAGNOSIS — M6281 Muscle weakness (generalized): Secondary | ICD-10-CM | POA: Diagnosis not present

## 2017-02-02 DIAGNOSIS — I6990 Unspecified sequelae of unspecified cerebrovascular disease: Secondary | ICD-10-CM | POA: Diagnosis not present

## 2017-02-03 DIAGNOSIS — I6990 Unspecified sequelae of unspecified cerebrovascular disease: Secondary | ICD-10-CM | POA: Diagnosis not present

## 2017-02-03 DIAGNOSIS — M6281 Muscle weakness (generalized): Secondary | ICD-10-CM | POA: Diagnosis not present

## 2017-02-04 ENCOUNTER — Encounter: Payer: Self-pay | Admitting: Internal Medicine

## 2017-02-04 ENCOUNTER — Non-Acute Institutional Stay (SKILLED_NURSING_FACILITY): Payer: Medicare Other | Admitting: Internal Medicine

## 2017-02-04 DIAGNOSIS — N950 Postmenopausal bleeding: Secondary | ICD-10-CM | POA: Diagnosis not present

## 2017-02-04 DIAGNOSIS — M6281 Muscle weakness (generalized): Secondary | ICD-10-CM | POA: Diagnosis not present

## 2017-02-04 DIAGNOSIS — N859 Noninflammatory disorder of uterus, unspecified: Secondary | ICD-10-CM

## 2017-02-04 DIAGNOSIS — N858 Other specified noninflammatory disorders of uterus: Secondary | ICD-10-CM

## 2017-02-04 DIAGNOSIS — I6990 Unspecified sequelae of unspecified cerebrovascular disease: Secondary | ICD-10-CM | POA: Diagnosis not present

## 2017-02-04 NOTE — Patient Instructions (Addendum)
Plan: See orders and recommendations. Total time 35 minutes coordinating care for problems addressed at this encounter

## 2017-02-04 NOTE — Progress Notes (Signed)
    This is a nursing facility follow up for specific acute issue of vaginal bleeding.  Interim medical record and care since last [place] visit was updated with review of diagnostic studies and change in clinical status since last visit were documented.  HPI: We received a change in condition form stating the patient is having vaginal bleeding. The chart was reviewed. She was remotely noted to have vaginal bleeding at [facility]. Seroma of the uterus was suspect. Family did not wish to pursue further diagnostic or interventional studies because of her dementia. Her last H&H was in November 2017 with Hgb  10.4 and hematocrit of 36.  Review of systems: Dementia invalidated responses as oriented only to self only.  Patient denies pain in abdomen.   Epistaxis, hemoptysis, hematuria, melena, or rectal bleeding denied by nursing staff.   No unexplained weight loss, significant dyspepsia,dysphagia per nursing staff.    There is no abnormal bruising , bleeding, or difficulty stopping bleeding with injury.  Physical exam:  Pertinent or positive findings: Variable exotropia of right eye. Vision decreased, patient able to see only shapes.   Upper dentures present, lower dentures absent.  1+ pitting bilateral lower extremity edema.  13cmx12cm lipoma appreciated on right posterior shoulder.  Mass transilluminates and is freely movable.    General appearance:Adequately nourished; no acute distress , increased work of breathing is present.   Lymphatic: No lymphadenopathy about the head, neck, axilla . Eyes: No conjunctival inflammation or lid edema is present. There is no scleral icterus. Ears:  External ear exam shows no significant lesions or deformities.   Nose:  External nasal examination shows no deformity or inflammation. Nasal mucosa are pink and moist without lesions ,exudates Oral exam: lips and gums are healthy appearing.There is no oropharyngeal erythema or exudate  . Neck:  No thyromegaly, masses, tenderness noted.    Heart:  Normal rate and regular rhythm. S1 and S2 normal without gallop, murmur, click, rub .  Lungs:Chest clear to auscultation without wheezes, rhonchi,rales , rubs. Abdomen:Bowel sounds are normal. Abdomen is soft and nontender with no organomegaly, hernias,masses. GU: deferred  Extremities:  No cyanosis, clubbing Neurologic exam : Strength equal  in upper & lower extremities Balance,Rhomberg,finger to nose testing could not be completed due to clinical state Skin: Warm & dry w/o tenting. No significant lesions or rash.   #1 vaginal bleeding.  Review of medical history reveals previous GYN workup and family has declined further intervention.  Obtain CBC today. If there is dramatic progression of the anemia, gynecologic reassessment would be pursued locally.

## 2017-02-04 NOTE — Assessment & Plan Note (Signed)
CBC and differential Consider ultrasound  & Gynecology reassessment if there has been progression of anemia

## 2017-02-04 NOTE — Assessment & Plan Note (Signed)
CBC

## 2017-02-05 DIAGNOSIS — M6281 Muscle weakness (generalized): Secondary | ICD-10-CM | POA: Diagnosis not present

## 2017-02-05 DIAGNOSIS — I6990 Unspecified sequelae of unspecified cerebrovascular disease: Secondary | ICD-10-CM | POA: Diagnosis not present

## 2017-02-05 DIAGNOSIS — D649 Anemia, unspecified: Secondary | ICD-10-CM | POA: Diagnosis not present

## 2017-02-05 LAB — CBC AND DIFFERENTIAL
HEMATOCRIT: 30 % — AB (ref 36–46)
Hemoglobin: 9.4 g/dL — AB (ref 12.0–16.0)
PLATELETS: 157 10*3/uL (ref 150–399)
WBC: 5.3 10^3/mL

## 2017-02-08 DIAGNOSIS — M6281 Muscle weakness (generalized): Secondary | ICD-10-CM | POA: Diagnosis not present

## 2017-02-08 DIAGNOSIS — I6990 Unspecified sequelae of unspecified cerebrovascular disease: Secondary | ICD-10-CM | POA: Diagnosis not present

## 2017-02-09 DIAGNOSIS — I6990 Unspecified sequelae of unspecified cerebrovascular disease: Secondary | ICD-10-CM | POA: Diagnosis not present

## 2017-02-09 DIAGNOSIS — M6281 Muscle weakness (generalized): Secondary | ICD-10-CM | POA: Diagnosis not present

## 2017-02-10 DIAGNOSIS — I6990 Unspecified sequelae of unspecified cerebrovascular disease: Secondary | ICD-10-CM | POA: Diagnosis not present

## 2017-02-10 DIAGNOSIS — M6281 Muscle weakness (generalized): Secondary | ICD-10-CM | POA: Diagnosis not present

## 2017-02-11 DIAGNOSIS — I6990 Unspecified sequelae of unspecified cerebrovascular disease: Secondary | ICD-10-CM | POA: Diagnosis not present

## 2017-02-11 DIAGNOSIS — M6281 Muscle weakness (generalized): Secondary | ICD-10-CM | POA: Diagnosis not present

## 2017-02-12 DIAGNOSIS — I6990 Unspecified sequelae of unspecified cerebrovascular disease: Secondary | ICD-10-CM | POA: Diagnosis not present

## 2017-02-12 DIAGNOSIS — M6281 Muscle weakness (generalized): Secondary | ICD-10-CM | POA: Diagnosis not present

## 2017-02-15 DIAGNOSIS — I6990 Unspecified sequelae of unspecified cerebrovascular disease: Secondary | ICD-10-CM | POA: Diagnosis not present

## 2017-02-15 DIAGNOSIS — M6281 Muscle weakness (generalized): Secondary | ICD-10-CM | POA: Diagnosis not present

## 2017-02-16 DIAGNOSIS — I6990 Unspecified sequelae of unspecified cerebrovascular disease: Secondary | ICD-10-CM | POA: Diagnosis not present

## 2017-02-16 DIAGNOSIS — M6281 Muscle weakness (generalized): Secondary | ICD-10-CM | POA: Diagnosis not present

## 2017-02-17 DIAGNOSIS — M6281 Muscle weakness (generalized): Secondary | ICD-10-CM | POA: Diagnosis not present

## 2017-02-17 DIAGNOSIS — I6990 Unspecified sequelae of unspecified cerebrovascular disease: Secondary | ICD-10-CM | POA: Diagnosis not present

## 2017-02-18 DIAGNOSIS — I6990 Unspecified sequelae of unspecified cerebrovascular disease: Secondary | ICD-10-CM | POA: Diagnosis not present

## 2017-02-18 DIAGNOSIS — M6281 Muscle weakness (generalized): Secondary | ICD-10-CM | POA: Diagnosis not present

## 2017-02-19 DIAGNOSIS — M6281 Muscle weakness (generalized): Secondary | ICD-10-CM | POA: Diagnosis not present

## 2017-02-19 DIAGNOSIS — I6990 Unspecified sequelae of unspecified cerebrovascular disease: Secondary | ICD-10-CM | POA: Diagnosis not present

## 2017-02-22 DIAGNOSIS — M6281 Muscle weakness (generalized): Secondary | ICD-10-CM | POA: Diagnosis not present

## 2017-02-22 DIAGNOSIS — I6990 Unspecified sequelae of unspecified cerebrovascular disease: Secondary | ICD-10-CM | POA: Diagnosis not present

## 2017-02-23 DIAGNOSIS — M6281 Muscle weakness (generalized): Secondary | ICD-10-CM | POA: Diagnosis not present

## 2017-02-23 DIAGNOSIS — I6990 Unspecified sequelae of unspecified cerebrovascular disease: Secondary | ICD-10-CM | POA: Diagnosis not present

## 2017-02-24 ENCOUNTER — Non-Acute Institutional Stay (SKILLED_NURSING_FACILITY): Payer: Medicare Other | Admitting: Nurse Practitioner

## 2017-02-24 DIAGNOSIS — F0390 Unspecified dementia without behavioral disturbance: Secondary | ICD-10-CM | POA: Diagnosis not present

## 2017-02-24 DIAGNOSIS — R569 Unspecified convulsions: Secondary | ICD-10-CM | POA: Diagnosis not present

## 2017-02-24 DIAGNOSIS — D62 Acute posthemorrhagic anemia: Secondary | ICD-10-CM | POA: Diagnosis not present

## 2017-02-24 DIAGNOSIS — N950 Postmenopausal bleeding: Secondary | ICD-10-CM | POA: Diagnosis not present

## 2017-02-24 DIAGNOSIS — I10 Essential (primary) hypertension: Secondary | ICD-10-CM | POA: Diagnosis not present

## 2017-02-24 NOTE — Progress Notes (Signed)
Careteam: Patient Care Team: Diane Limes, MD as PCP - General (Internal Medicine)  Advanced Directive information    No Known Allergies  Chief Complaint  Patient presents with  . Medical Management of Chronic Issues     HPI: Patient is a 76 y.o. female seen at [facility] today for routine follow up for chronic conditions. Past medical history includes dementia, seizures, HTN, CVA, HLD, and vaginal bleeding due to uterine mass. Pt doing well over the past month with no new complaints. Pt did have episode of vaginal bleeding. Follow up cbc done and hgb has worsened on most recent lab. No further bleeding in the last week. Pt denies fatigue, palpitation, chest pains or shortness of breath. She reports sleeping well and a good appetite per pt and staff. Weight stable at 203 lb. Pt is without increased anxiety, depression or behaviors. Pt with dementia but no acute changes.    Review of Systems:  Review of Systems  Constitutional: Negative for activity change, appetite change, chills, fatigue and fever.  HENT: Negative for congestion, rhinorrhea, sinus pain, sinus pressure, sneezing and sore throat.   Respiratory: Negative for cough, choking, shortness of breath and wheezing.   Cardiovascular: Negative for chest pain, palpitations and leg swelling.  Gastrointestinal: Negative for abdominal distention, abdominal pain, constipation, diarrhea, nausea and vomiting.  Genitourinary: Negative for difficulty urinating, dysuria, urgency and vaginal bleeding.  Musculoskeletal: Negative for arthralgias and myalgias.  Skin: Negative for color change and rash.  Neurological: Negative for dizziness, weakness and headaches.  Psychiatric/Behavioral: Positive for confusion. Negative for agitation and behavioral problems.    Past Medical History:  Diagnosis Date  . Alzheimer disease   . 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 CAD (coronary artery disease)   . CORONARY ARTERY DISEASE 02/14/2007  . CVA  02/10/2008  . DEMENTIA 01/20/2010  . Edema   . ENDOCARDITIS 09/29/2010  . GERD (gastroesophageal reflux disease)   . Glaucoma   . Gout   . HYPERLIPIDEMIA 02/14/2007  . HYPERTENSION 02/14/2007  . Lack of coordination   . Morbid obesity ([place])   . Myocardial infarct   . MYOCARDIAL INFARCTION, HX OF 02/14/2007  . OBESITY 01/16/2009  . OBSTRUCTIVE SLEEP APNEA 05/27/2007  . 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 Postmenopausal bleeding   . Unspecified urinary incontinence    Past Surgical History:  Procedure Laterality Date  . TUBAL LIGATION     Social History:   reports that she has never smoked. She has never used smokeless tobacco. She reports that she does not drink alcohol or use drugs.  Family History  Problem Relation Age of Onset  . Heart failure Mother     Deceased  . Cancer Mother   . Heart disease Father     Deceased  . Kidney disease Brother     Deceased  . Stroke Sister   . Hypertension Brother   . Hypertension Sister   . Heart disease Sister     Deceased  . Hypertension Sister     x2  . Hypertension Son     x2  . Glaucoma Son     Medications: Patient's Medications  New Prescriptions   No medications on file  Previous Medications   ACETAMINOPHEN (TYLENOL) 325 MG TABLET    Take 2 tablets (650 mg total) by mouth every 6 (six) hours as needed for mild pain (or Fever >/= 101).   ASPIRIN ([employer] CHILDRENS ASPIRIN) 81 MG CHEWABLE TABLET    TAKE 1  TABLET (81 MG TOTAL) BY MOUTH DAILY.   ATORVASTATIN (LIPITOR) 40 MG TABLET    Take 1 tablet (40 mg total) by mouth daily.   CALCIUM-VITAMIN D 600-200 MG-UNIT TABLET    Take 1 tablet by mouth daily.   CARVEDILOL (COREG) 25 MG TABLET    TAKE 1 TABLET BY MOUTH TWICE A DAY WITH A MEAL   DONEPEZIL (ARICEPT) 10 MG TABLET    Take 1 tablet (10 mg total) by mouth at bedtime.   FUROSEMIDE (LASIX) 40 MG TABLET    Take 1 tablet (40 mg total) by mouth daily.   LATANOPROST (XALATAN) 0.005 % OPHTHALMIC SOLUTION    Place 1 drop into  both eyes at bedtime.   LEVETIRACETAM (KEPPRA) 500 MG TABLET    Take 1 tablet (500 mg total) by mouth 2 (two) times daily.   LISINOPRIL (PRINIVIL,ZESTRIL) 40 MG TABLET    Take 1 tablet (40 mg total) by mouth daily.   MELATONIN 3 MG TABS    Take 1 tablet by mouth at bedtime.   MEMANTINE (NAMENDA XR) 28 MG CP24 24 HR CAPSULE    Take 28 mg by mouth daily.   SERTRALINE (ZOLOFT) 50 MG TABLET    Take 50 mg by mouth at bedtime.  Modified Medications   No medications on file  Discontinued Medications   No medications on file     Physical Exam:  Vitals:   02/24/17 1330  BP: 122/67  Pulse: 70  Resp: 20  Temp: 97.8 F (36.6 C)  Weight: 203 lb (92.1 kg)   Body mass index is 37.13 kg/m.  Physical Exam  Constitutional: She appears well-developed and well-nourished. No distress.  HENT:  Head: Normocephalic and atraumatic.  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 Neck supple.  Cardiovascular: Normal rate, regular rhythm and normal heart sounds.   No murmur heard. Pulmonary/Chest: Effort normal and breath sounds normal. No respiratory distress. She has no wheezes.  Abdominal: Soft. Bowel sounds are normal. She exhibits no distension. There is no tenderness.  Musculoskeletal: She exhibits no edema or tenderness.  Wheelchair bound  Neurological: She is alert.  Oriented to person only. Left sided hemiparesis  Skin: Skin is warm and dry.  Psychiatric: She has a normal mood and affect. Her behavior is normal.    Labs reviewed: Basic Metabolic Panel:  Recent Labs  03/30/16 1713 03/30/16 1717  04/01/16 0541 04/02/16 0625 04/03/16 0640  06/28/16 07/19/16 10/29/16  NA  --   --   < > 141 144 141  < > 149* 142 146  K  --   --   < > 3.3* 4.5 4.9  < > 3.9 3.7 3.8  CL  --   --   < > 105 109 108  --   --   --   --   CO2  --   --   < > 27 27 23   --   --   --   --   GLUCOSE  --   --   < > 87 94 78  --   --   --   --   BUN  --   --   < > 7 <5* 9  < > 19 21 21   CREATININE  --   --    < > 0.84 0.80 0.76  < > 0.9 0.9 0.7  CALCIUM  --   --   < > 8.7* 8.8* 9.1  --   --   --   --  MG 1.7  --   --  1.6* 1.7  --   --   --   --   --   TSH  --  3.033  --   --   --   --   --   --   --   --   < > = values in this interval not displayed. Liver Function Tests:  Recent Labs  03/30/16 1225 06/28/16 10/29/16  AST 21 12* 14  ALT 13* 10 11  ALKPHOS 63 70 84  BILITOT 0.8  --   --   PROT 6.5  --   --   ALBUMIN 3.5  --   --    No results for input(s): LIPASE, AMYLASE in the last 8760 hours. No results for input(s): AMMONIA in the last 8760 hours. CBC:  Recent Labs  03/30/16 1225  04/01/16 0541 04/02/16 0753  07/30/16 10/29/16 02/05/17  WBC 4.0  --  5.0 4.1  < > 4.6 5.3 5.3  NEUTROABS 2.0  --   --   --   --   --   --   --   HGB 13.6  < > 12.4 12.6  < > 9.7* 10.4* 9.4*  HCT 41.2  < > 38.3 37.6  < > 31* 36 30*  MCV 89.8  --  89.7 90.6  --   --   --   --   PLT 150  --  174 158  < > 200 147* 157  < > = values in this interval not displayed. Lipid Panel:  Recent Labs  06/28/16  CHOL 132  HDL 42  LDLCALC 81  TRIG 46   TSH:  Recent Labs  03/30/16 1717  TSH 3.033   A1C: No results found for: HGBA1C   Assessment/Plan 1. Acute blood loss anemia hgb slightly down since last check, had episode of vaginal bleeding. Will start ferrous sulfate 325 mg PO daily at this time.   2. Postmenopausal bleeding Noted to have bleeding around the first of the month, none since.   3. Essential hypertension Blood pressure stable, will cont lisinopril, lasix,   4. Dementia without behavioral disturbance, unspecified dementia type Stable, without significant changes in the last month. conts aricept and namenda.   5. Seizure ([place]) No noted seizures per staff, conts on [place]. Harle Battiest  [facility] [phone] 8 am - 5 pm) [phone] (after hours)

## 2017-03-24 ENCOUNTER — Non-Acute Institutional Stay (SKILLED_NURSING_FACILITY): Payer: Medicare Other | Admitting: Nurse Practitioner

## 2017-03-24 ENCOUNTER — Encounter: Payer: Self-pay | Admitting: Nurse Practitioner

## 2017-03-24 DIAGNOSIS — D62 Acute posthemorrhagic anemia: Secondary | ICD-10-CM

## 2017-03-24 DIAGNOSIS — I10 Essential (primary) hypertension: Secondary | ICD-10-CM | POA: Diagnosis not present

## 2017-03-24 DIAGNOSIS — F0390 Unspecified dementia without behavioral disturbance: Secondary | ICD-10-CM

## 2017-03-24 DIAGNOSIS — N858 Other specified noninflammatory disorders of uterus: Secondary | ICD-10-CM

## 2017-03-24 DIAGNOSIS — N859 Noninflammatory disorder of uterus, unspecified: Secondary | ICD-10-CM

## 2017-03-24 DIAGNOSIS — R569 Unspecified convulsions: Secondary | ICD-10-CM | POA: Diagnosis not present

## 2017-03-24 DIAGNOSIS — E785 Hyperlipidemia, unspecified: Secondary | ICD-10-CM | POA: Diagnosis not present

## 2017-03-24 NOTE — Progress Notes (Signed)
? ?>1 ?   Nursing Home Location:  [facility]  Place of Service: SNF (31)  PCP: Unice Cobble, MD  No Known Allergies  Chief Complaint  Patient presents with  . Medical Management of Chronic Issues    Resident is being seen for a routine visit.     HPI:  Patient is a 76 y.o. female seen today at [facility] for routine follow up. Pt with hx of dementia, seizures, htn, CVA, hyperlipidemia, vaginal bleeding due to uterine mass.   Pt was placed on iron supplements last month. No reports of excessive bleeding but pt will have occasional spotting episodes. Pt reports she is doing well. She has no complaints at this time. Staff reports there is no concerns. There has been no acute issues in the last month. Weight has been stable since January. No complaints of pain. Reports she is sleeping well. Pt gets OOB daily and sits at nursing station. Goes to dining room for meals. There has not been a significant change in cognitive status.   Review of Systems:  Review of Systems  Constitutional: Negative for activity change, appetite change, fatigue and unexpected weight change.  HENT: Negative for congestion and hearing loss.   Eyes: Negative.   Respiratory: Negative for cough and shortness of breath.   Cardiovascular: Negative for chest pain, palpitations and leg swelling.  Gastrointestinal: Negative for abdominal pain, constipation and diarrhea.  Genitourinary: Negative for difficulty urinating, dysuria and vaginal bleeding.  Musculoskeletal: Negative for arthralgias and myalgias.  Skin: Negative for color change and wound.  Neurological: Negative for dizziness and weakness.  Psychiatric/Behavioral: Positive for confusion. Negative for agitation and behavioral problems.    Past Medical History:  Diagnosis Date  . Alzheimer disease   . 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 CAD (coronary artery disease)   . CORONARY ARTERY DISEASE 02/14/2007  . CVA 02/10/2008  . DEMENTIA 01/20/2010  .  Edema   . ENDOCARDITIS 09/29/2010  . GERD (gastroesophageal reflux disease)   . Glaucoma   . Gout   . HYPERLIPIDEMIA 02/14/2007  . HYPERTENSION 02/14/2007  . Lack of coordination   . Morbid obesity ([place])   . Myocardial infarct ([place])   . MYOCARDIAL INFARCTION, HX OF 02/14/2007  . OBESITY 01/16/2009  . OBSTRUCTIVE SLEEP APNEA 05/27/2007  . 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 Postmenopausal bleeding   . Unspecified urinary incontinence    Past Surgical History:  Procedure Laterality Date  . TUBAL LIGATION     Social History:   reports that she has never smoked. She has never used smokeless tobacco. She reports that she does not drink alcohol or use drugs.  Family History  Problem Relation Age of Onset  . Heart failure Mother     Deceased  . Cancer Mother   . Heart disease Father     Deceased  . Kidney disease Brother     Deceased  . Stroke Sister   . Hypertension Brother   . Hypertension Sister   . Heart disease Sister     Deceased  . Hypertension Sister     x2  . Hypertension Son     x2  . Glaucoma Son     Medications: Patient's Medications  New Prescriptions   No medications on file  Previous Medications   ACETAMINOPHEN (TYLENOL) 325 MG TABLET    Take 2 tablets (650 mg total) by mouth every 6 (six) hours as needed for mild pain (or Fever >/= 101).  ASPIRIN ([employer] CHILDRENS ASPIRIN) 81 MG CHEWABLE TABLET    TAKE 1 TABLET (81 MG TOTAL) BY MOUTH DAILY.   ATORVASTATIN (LIPITOR) 40 MG TABLET    Take 1 tablet (40 mg total) by mouth daily.   CALCIUM-VITAMIN D 600-200 MG-UNIT TABLET    Take 1 tablet by mouth daily.   CARVEDILOL (COREG) 25 MG TABLET    TAKE 1 TABLET BY MOUTH TWICE A DAY WITH A MEAL   DONEPEZIL (ARICEPT) 10 MG TABLET    Take 1 tablet (10 mg total) by mouth at bedtime.   FERROUS SULFATE 325 (65 FE) MG TABLET    Take 325 mg by mouth daily with breakfast.   FUROSEMIDE (LASIX) 40 MG TABLET    Take 1 tablet (40 mg total) by mouth daily.   LATANOPROST  (XALATAN) 0.005 % OPHTHALMIC SOLUTION    Place 1 drop into both eyes at bedtime.   LEVETIRACETAM (KEPPRA) 500 MG TABLET    Take 1 tablet (500 mg total) by mouth 2 (two) times daily.   LISINOPRIL (PRINIVIL,ZESTRIL) 40 MG TABLET    Take 1 tablet (40 mg total) by mouth daily.   MELATONIN 3 MG TABS    Take 1 tablet by mouth at bedtime.   MEMANTINE (NAMENDA XR) 28 MG CP24 24 HR CAPSULE    Take 28 mg by mouth daily.   SERTRALINE (ZOLOFT) 50 MG TABLET    Take 50 mg by mouth at bedtime.  Modified Medications   No medications on file  Discontinued Medications   No medications on file     Physical Exam: Vitals:   03/24/17 1537  BP: 128/70  Pulse: 76  Resp: 20  Temp: 98.1 F (36.7 C)  SpO2: 96%  Weight: 205 lb 3.2 oz (93.1 kg)  Height: 5\' 2"  (1.575 m)    Physical Exam  Constitutional: She appears well-developed and well-nourished. No distress.  HENT:  Head: Normocephalic and atraumatic.  Mouth/Throat: Oropharynx is clear and moist. No oropharyngeal exudate.  Eyes: Conjunctivae are normal. Pupils are equal, round, and reactive to light.  blind  Neck: Normal range of motion. Neck supple.  Cardiovascular: Normal rate, regular rhythm and normal heart sounds.   Pulmonary/Chest: Effort normal and breath sounds normal.  Abdominal: Soft. Bowel sounds are normal.  Musculoskeletal: She exhibits no edema or tenderness.  Neurological: She is alert.  Alert and Oriented to person only  Left sided hemiparesis   Skin: Skin is warm and dry. She is not diaphoretic.  Psychiatric: She has a normal mood and affect.    Labs reviewed: Basic Metabolic Panel:  Recent Labs  03/30/16 1713  04/01/16 0541 04/02/16 0625 04/03/16 0640  06/28/16 07/19/16 10/29/16  NA  --   < > 141 144 141  < > 149* 142 146  K  --   < > 3.3* 4.5 4.9  < > 3.9 3.7 3.8  CL  --   < > 105 109 108  --   --   --   --   CO2  --   < > 27 27 23   --   --   --   --   GLUCOSE  --   < > 87 94 78  --   --   --   --   BUN  --   < > 7  <5* 9  < > 19 21 21   CREATININE  --   < > 0.84 0.80 0.76  < > 0.9 0.9 0.7  CALCIUM  --   < >  8.7* 8.8* 9.1  --   --   --   --   MG 1.7  --  1.6* 1.7  --   --   --   --   --   < > = values in this interval not displayed. Liver Function Tests:  Recent Labs  03/30/16 1225 06/28/16 10/29/16  AST 21 12* 14  ALT 13* 10 11  ALKPHOS 63 70 84  BILITOT 0.8  --   --   PROT 6.5  --   --   ALBUMIN 3.5  --   --    No results for input(s): LIPASE, AMYLASE in the last 8760 hours. No results for input(s): AMMONIA in the last 8760 hours. CBC:  Recent Labs  03/30/16 1225  04/01/16 0541 04/02/16 0753  07/30/16 10/29/16 02/05/17  WBC 4.0  --  5.0 4.1  < > 4.6 5.3 5.3  NEUTROABS 2.0  --   --   --   --   --   --   --   HGB 13.6  < > 12.4 12.6  < > 9.7* 10.4* 9.4*  HCT 41.2  < > 38.3 37.6  < > 31* 36 30*  MCV 89.8  --  89.7 90.6  --   --   --   --   PLT 150  --  174 158  < > 200 147* 157  < > = values in this interval not displayed. TSH:  Recent Labs  03/30/16 1717  TSH 3.033   A1C: No results found for: HGBA1C Lipid Panel:  Recent Labs  06/28/16  CHOL 132  HDL 42  LDLCALC 81  TRIG 46   Iron total 07/19/16: 49 TIBC  07/19/16:  264 Calcium 07/19/16: 9.0 Vitamin B12 07/30/16: Vitamin B12: 407 Ferritin 07/30/16:  19  FOB 07/30/16: Negative  Assessment/Plan 1. Acute blood loss anemia Without recent bleeding. conts on iron. Will monitor h&h.   2. Essential hypertension -blood pressure stable. Cont current regimen.   3. Dementia without behavioral disturbance, unspecified dementia type -stable without significant change in cognitive or behavioral status. conts on aricept and namenda.   4. Uterine mass No further work up per family but cause of the blood loss anemia.   5. Hyperlipidemia LDL goal <100 LDL at goal in July, conts on lipitor  6. Seizure ([place]) No recurrent seizures noted, conts on keppra  Markas Aldredge K. Harle Battiest  [facility]  Medicine [phone] 8 am - 5 pm) [phone] (after hours)

## 2017-05-05 ENCOUNTER — Non-Acute Institutional Stay (SKILLED_NURSING_FACILITY): Payer: Medicare Other | Admitting: Nurse Practitioner

## 2017-05-05 ENCOUNTER — Encounter: Payer: Self-pay | Admitting: Nurse Practitioner

## 2017-05-05 DIAGNOSIS — N859 Noninflammatory disorder of uterus, unspecified: Secondary | ICD-10-CM

## 2017-05-05 DIAGNOSIS — F0390 Unspecified dementia without behavioral disturbance: Secondary | ICD-10-CM | POA: Diagnosis not present

## 2017-05-05 DIAGNOSIS — R569 Unspecified convulsions: Secondary | ICD-10-CM | POA: Diagnosis not present

## 2017-05-05 DIAGNOSIS — I10 Essential (primary) hypertension: Secondary | ICD-10-CM

## 2017-05-05 DIAGNOSIS — N858 Other specified noninflammatory disorders of uterus: Secondary | ICD-10-CM

## 2017-05-05 NOTE — Progress Notes (Signed)
? ?>1 ?   Nursing Home Location:  [facility] Room: 214 A  Place of Service: SNF (31)  PCP: Hendricks Limes, MD  No Known Allergies  Chief Complaint  Patient presents with  . Medical Management of Chronic Issues    Resident is being seen for a routine visit.     HPI:  Patient is a 76 y.o. female seen today at [facility] for routine follow up. Pt with hx of dementia, seizures, htn, CVA, hyperlipidemia, vaginal bleeding due to uterine mass.  There has been no bleeding noted in the last month. Weight loss noted over the last 90 days. RD has been following pt. She is eating 75-100% of meals. Pt denies pain or discomfort. She has dementia therefore ROS and HPI limited. Nursing without acute concerns.   Review of Systems:  Review of Systems  Unable to perform ROS: Dementia    Past Medical History:  Diagnosis Date  . Alzheimer disease   . 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 CAD (coronary artery disease)   . CORONARY ARTERY DISEASE 02/14/2007  . CVA 02/10/2008  . DEMENTIA 01/20/2010  . Edema   . ENDOCARDITIS 09/29/2010  . GERD (gastroesophageal reflux disease)   . Glaucoma   . Gout   . HYPERLIPIDEMIA 02/14/2007  . HYPERTENSION 02/14/2007  . Lack of coordination   . Morbid obesity ([place])   . Myocardial infarct ([place])   . MYOCARDIAL INFARCTION, HX OF 02/14/2007  . OBESITY 01/16/2009  . OBSTRUCTIVE SLEEP APNEA 05/27/2007  . 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 Postmenopausal bleeding   . Unspecified urinary incontinence    Past Surgical History:  Procedure Laterality Date  . TUBAL LIGATION     Social History:   reports that she has never smoked. She has never used smokeless tobacco. She reports that she does not drink alcohol or use drugs.  Family History  Problem Relation Age of Onset  . Heart failure Mother        Deceased  . Cancer Mother   . Heart disease Father        Deceased  . Kidney disease Brother        Deceased  . Stroke Sister   .  Hypertension Brother   . Hypertension Sister   . Heart disease Sister        Deceased  . Hypertension Sister        x2  . Hypertension Son        x2  . Glaucoma Son     Medications: Patient's Medications  New Prescriptions   No medications on file  Previous Medications   ACETAMINOPHEN (TYLENOL) 325 MG TABLET    Take 2 tablets (650 mg total) by mouth every 6 (six) hours as needed for mild pain (or Fever >/= 101).   ASPIRIN ([employer] CHILDRENS ASPIRIN) 81 MG CHEWABLE TABLET    TAKE 1 TABLET (81 MG TOTAL) BY MOUTH DAILY.   ATORVASTATIN (LIPITOR) 40 MG TABLET    Take 1 tablet (40 mg total) by mouth daily.   CALCIUM-VITAMIN D 600-200 MG-UNIT TABLET    Take 1 tablet by mouth daily.   CARVEDILOL (COREG) 25 MG TABLET    TAKE 1 TABLET BY MOUTH TWICE A DAY WITH A MEAL   DONEPEZIL (ARICEPT) 10 MG TABLET    Take 1 tablet (10 mg total) by mouth at bedtime.   FERROUS SULFATE 325 (65 FE) MG TABLET    Take 325 mg by mouth daily  with breakfast.   FUROSEMIDE (LASIX) 40 MG TABLET    Take 1 tablet (40 mg total) by mouth daily.   LATANOPROST (XALATAN) 0.005 % OPHTHALMIC SOLUTION    Place 1 drop into both eyes at bedtime.   LEVETIRACETAM (KEPPRA) 500 MG TABLET    Take 1 tablet (500 mg total) by mouth 2 (two) times daily.   LISINOPRIL (PRINIVIL,ZESTRIL) 40 MG TABLET    Take 1 tablet (40 mg total) by mouth daily.   MELATONIN 3 MG TABS    Take 1 tablet by mouth at bedtime.   MEMANTINE (NAMENDA XR) 28 MG CP24 24 HR CAPSULE    Take 28 mg by mouth daily.   SERTRALINE (ZOLOFT) 50 MG TABLET    Take 50 mg by mouth at bedtime.  Modified Medications   No medications on file  Discontinued Medications   No medications on file     Physical Exam: Vitals:   05/05/17 1101  BP: 134/72  Pulse: 82  Resp: 19  Temp: 97.8 F (36.6 C)  SpO2: 98%  Weight: 201 lb 9.6 oz (91.4 kg)  Height: 5\' 2"  (1.575 m)    Physical Exam  Constitutional: She appears well-developed and well-nourished. No distress.  HENT:  Head:  Normocephalic and atraumatic.  Mouth/Throat: Oropharynx is clear and moist. No oropharyngeal exudate.  Eyes: Conjunctivae are normal. Pupils are equal, round, and reactive to light.  blind  Neck: Normal range of motion. Neck supple.  Cardiovascular: Normal rate, regular rhythm and normal heart sounds.   Pulmonary/Chest: Effort normal and breath sounds normal.  Abdominal: Soft. Bowel sounds are normal.  Musculoskeletal: She exhibits no edema or tenderness.  Neurological: She is alert.  Alert and Oriented to person only  Left sided hemiparesis   Skin: Skin is warm and dry. She is not diaphoretic.  Psychiatric: She has a normal mood and affect.    Labs reviewed: Basic Metabolic Panel:  Recent Labs  06/28/16 07/19/16 10/29/16  NA 149* 142 146  K 3.9 3.7 3.8  BUN 19 21 21   CREATININE 0.9 0.9 0.7   Liver Function Tests:  Recent Labs  06/28/16 10/29/16  AST 12* 14  ALT 10 11  ALKPHOS 70 84   No results for input(s): LIPASE, AMYLASE in the last 8760 hours. No results for input(s): AMMONIA in the last 8760 hours. CBC:  Recent Labs  07/30/16 10/29/16 02/05/17  WBC 4.6 5.3 5.3  HGB 9.7* 10.4* 9.4*  HCT 31* 36 30*  PLT 200 147* 157   TSH: No results for input(s): TSH in the last 8760 hours. A1C: No results found for: HGBA1C Lipid Panel:  Recent Labs  06/28/16  CHOL 132  HDL 42  LDLCALC 81  TRIG 46   Iron total 07/19/16: 49 TIBC  07/19/16:  264 Calcium 07/19/16: 9.0 Vitamin B12 07/30/16: Vitamin B12: 407 Ferritin 07/30/16:  19  FOB 07/30/16: Negative  Assessment/Plan 1. Essential hypertension -stable; to cont current regimen   2. Dementia without behavioral disturbance, unspecified dementia type Advanced dementia. Anticipate further decline in functional and cognitive status as disease progresses. conts on aricept and namenda.   3. Seizure ([place]) No recent seizures, conts on keppra  4. Uterine mass Found at [facility] due to postmenopausal bleeding, no further work  up at this time per family, Without vaginal bleeding at this time.   Carlos American. Harle Battiest  [facility] [phone] 8 am - 5 pm) [phone] (after hours)

## 2017-05-14 ENCOUNTER — Encounter (HOSPITAL_COMMUNITY): Payer: Self-pay | Admitting: Emergency Medicine

## 2017-05-14 ENCOUNTER — Inpatient Hospital Stay (HOSPITAL_COMMUNITY)
Admission: EM | Admit: 2017-05-14 | Discharge: 2017-05-20 | DRG: 193 | Disposition: A | Discharge status: Skilled Nursing Facility | Payer: Medicare Other | Attending: Internal Medicine | Admitting: Internal Medicine

## 2017-05-14 ENCOUNTER — Emergency Department (HOSPITAL_COMMUNITY): Payer: Medicare Other

## 2017-05-14 DIAGNOSIS — I10 Essential (primary) hypertension: Secondary | ICD-10-CM | POA: Diagnosis present

## 2017-05-14 DIAGNOSIS — I110 Hypertensive heart disease with heart failure: Secondary | ICD-10-CM | POA: Diagnosis not present

## 2017-05-14 DIAGNOSIS — Z515 Encounter for palliative care: Secondary | ICD-10-CM

## 2017-05-14 DIAGNOSIS — Z8673 Personal history of transient ischemic attack (TIA), and cerebral infarction without residual deficits: Secondary | ICD-10-CM

## 2017-05-14 DIAGNOSIS — R0902 Hypoxemia: Secondary | ICD-10-CM

## 2017-05-14 DIAGNOSIS — E662 Morbid (severe) obesity with alveolar hypoventilation: Secondary | ICD-10-CM | POA: Diagnosis not present

## 2017-05-14 DIAGNOSIS — H409 Unspecified glaucoma: Secondary | ICD-10-CM | POA: Diagnosis present

## 2017-05-14 DIAGNOSIS — C773 Secondary and unspecified malignant neoplasm of axilla and upper limb lymph nodes: Secondary | ICD-10-CM | POA: Diagnosis present

## 2017-05-14 DIAGNOSIS — M170 Bilateral primary osteoarthritis of knee: Secondary | ICD-10-CM | POA: Diagnosis present

## 2017-05-14 DIAGNOSIS — J189 Pneumonia, unspecified organism: Principal | ICD-10-CM | POA: Diagnosis present

## 2017-05-14 DIAGNOSIS — F0280 Dementia in other diseases classified elsewhere without behavioral disturbance: Secondary | ICD-10-CM | POA: Diagnosis present

## 2017-05-14 DIAGNOSIS — D649 Anemia, unspecified: Secondary | ICD-10-CM | POA: Diagnosis present

## 2017-05-14 DIAGNOSIS — C50911 Malignant neoplasm of unspecified site of right female breast: Secondary | ICD-10-CM | POA: Diagnosis present

## 2017-05-14 DIAGNOSIS — M109 Gout, unspecified: Secondary | ICD-10-CM | POA: Diagnosis present

## 2017-05-14 DIAGNOSIS — I5033 Acute on chronic diastolic (congestive) heart failure: Secondary | ICD-10-CM | POA: Diagnosis not present

## 2017-05-14 DIAGNOSIS — Z809 Family history of malignant neoplasm, unspecified: Secondary | ICD-10-CM

## 2017-05-14 DIAGNOSIS — Z7401 Bed confinement status: Secondary | ICD-10-CM

## 2017-05-14 DIAGNOSIS — Z818 Family history of other mental and behavioral disorders: Secondary | ICD-10-CM

## 2017-05-14 DIAGNOSIS — Z821 Family history of blindness and visual loss: Secondary | ICD-10-CM

## 2017-05-14 DIAGNOSIS — J90 Pleural effusion, not elsewhere classified: Secondary | ICD-10-CM

## 2017-05-14 DIAGNOSIS — I252 Old myocardial infarction: Secondary | ICD-10-CM

## 2017-05-14 DIAGNOSIS — K219 Gastro-esophageal reflux disease without esophagitis: Secondary | ICD-10-CM | POA: Diagnosis present

## 2017-05-14 DIAGNOSIS — J910 Malignant pleural effusion: Secondary | ICD-10-CM | POA: Diagnosis not present

## 2017-05-14 DIAGNOSIS — Z8249 Family history of ischemic heart disease and other diseases of the circulatory system: Secondary | ICD-10-CM

## 2017-05-14 DIAGNOSIS — H547 Unspecified visual loss: Secondary | ICD-10-CM | POA: Diagnosis present

## 2017-05-14 DIAGNOSIS — J9601 Acute respiratory failure with hypoxia: Secondary | ICD-10-CM | POA: Diagnosis not present

## 2017-05-14 DIAGNOSIS — Z7982 Long term (current) use of aspirin: Secondary | ICD-10-CM

## 2017-05-14 DIAGNOSIS — G309 Alzheimer's disease, unspecified: Secondary | ICD-10-CM | POA: Diagnosis present

## 2017-05-14 DIAGNOSIS — Z66 Do not resuscitate: Secondary | ICD-10-CM

## 2017-05-14 DIAGNOSIS — R062 Wheezing: Secondary | ICD-10-CM | POA: Diagnosis not present

## 2017-05-14 DIAGNOSIS — R569 Unspecified convulsions: Secondary | ICD-10-CM

## 2017-05-14 DIAGNOSIS — Z841 Family history of disorders of kidney and ureter: Secondary | ICD-10-CM

## 2017-05-14 DIAGNOSIS — I2510 Atherosclerotic heart disease of native coronary artery without angina pectoris: Secondary | ICD-10-CM | POA: Diagnosis present

## 2017-05-14 DIAGNOSIS — Z823 Family history of stroke: Secondary | ICD-10-CM

## 2017-05-14 DIAGNOSIS — F0390 Unspecified dementia without behavioral disturbance: Secondary | ICD-10-CM | POA: Diagnosis present

## 2017-05-14 DIAGNOSIS — E785 Hyperlipidemia, unspecified: Secondary | ICD-10-CM | POA: Diagnosis present

## 2017-05-14 DIAGNOSIS — R0602 Shortness of breath: Secondary | ICD-10-CM | POA: Diagnosis not present

## 2017-05-14 DIAGNOSIS — Z6839 Body mass index (BMI) 39.0-39.9, adult: Secondary | ICD-10-CM

## 2017-05-14 DIAGNOSIS — Z7189 Other specified counseling: Secondary | ICD-10-CM

## 2017-05-14 DIAGNOSIS — E876 Hypokalemia: Secondary | ICD-10-CM | POA: Diagnosis present

## 2017-05-14 DIAGNOSIS — R627 Adult failure to thrive: Secondary | ICD-10-CM | POA: Diagnosis present

## 2017-05-14 LAB — CBC WITH DIFFERENTIAL/PLATELET
BASOS ABS: 0 10*3/uL (ref 0.0–0.1)
Basophils Relative: 0 %
EOS ABS: 0.3 10*3/uL (ref 0.0–0.7)
Eosinophils Relative: 4 %
HCT: 42.2 % (ref 36.0–46.0)
Hemoglobin: 13.2 g/dL (ref 12.0–15.0)
LYMPHS ABS: 1.6 10*3/uL (ref 0.7–4.0)
Lymphocytes Relative: 23 %
MCH: 29 pg (ref 26.0–34.0)
MCHC: 31.3 g/dL (ref 30.0–36.0)
MCV: 92.7 fL (ref 78.0–100.0)
Monocytes Absolute: 0.4 10*3/uL (ref 0.1–1.0)
Monocytes Relative: 6 %
Neutro Abs: 4.7 10*3/uL (ref 1.7–7.7)
Neutrophils Relative %: 67 %
Platelets: 190 10*3/uL (ref 150–400)
RBC: 4.55 MIL/uL (ref 3.87–5.11)
RDW: 14.7 % (ref 11.5–15.5)
WBC: 7 10*3/uL (ref 4.0–10.5)

## 2017-05-14 LAB — I-STAT ARTERIAL BLOOD GAS, ED
Acid-Base Excess: 7 mmol/L — ABNORMAL HIGH (ref 0.0–2.0)
Bicarbonate: 32.8 mmol/L — ABNORMAL HIGH (ref 20.0–28.0)
O2 SAT: 96 %
PCO2 ART: 52.6 mmHg — AB (ref 32.0–48.0)
PH ART: 7.403 (ref 7.350–7.450)
PO2 ART: 85 mmHg (ref 83.0–108.0)
Patient temperature: 98.4
TCO2: 34 mmol/L (ref 0–100)

## 2017-05-14 LAB — I-STAT CG4 LACTIC ACID, ED: Lactic Acid, Venous: 1.94 mmol/L (ref 0.5–1.9)

## 2017-05-14 MED ORDER — CEFEPIME HCL 2 G IJ SOLR
2.0000 g | Freq: Once | INTRAMUSCULAR | Status: AC
  Administered 2017-05-14: 2 g via INTRAVENOUS
  Filled 2017-05-14: qty 2

## 2017-05-14 MED ORDER — FUROSEMIDE 10 MG/ML IJ SOLN
40.0000 mg | Freq: Once | INTRAMUSCULAR | Status: AC
  Administered 2017-05-14: 40 mg via INTRAVENOUS
  Filled 2017-05-14: qty 4

## 2017-05-14 MED ORDER — VANCOMYCIN HCL IN DEXTROSE 1-5 GM/200ML-% IV SOLN
1000.0000 mg | Freq: Once | INTRAVENOUS | Status: AC
  Administered 2017-05-15: 1000 mg via INTRAVENOUS
  Filled 2017-05-14: qty 200

## 2017-05-14 NOTE — ED Triage Notes (Signed)
Brought by ems from [place].  Per family patient was diagnosed with pneumonia today and they wanted her treated here.  Started on Levaquin.  Some wheezing noted in throat.  Lungs clear at this time.  O2 sat 88% on room air.  Placed on 2L [place] up to 94% at this time.  Denies any pain.  Breathing easy and nonlabored.

## 2017-05-14 NOTE — ED Provider Notes (Signed)
[facility] DEPT Provider Note   CSN: [id number] Arrival date & time: 05/14/17  2201    By signing my name below, I, Fabian Sharp, attest that this documentation has been prepared under the direction and in the presence of Boomer Winders, Annie Main, MD . Electronically Signed: Fabian Sharp, ED Scribe. 05/14/2017. 11:00 PM.  History   Chief Complaint Chief Complaint  Patient presents with  . Pneumonia   LEVEL 5 CAVEAT: DEMENTIA  HPI DWANDA TUFANO is a 76 y.o. female who presents to the Emergency Department BIB EMS from [facility] complaining of SOB onset PTA. Pt is experiencing associated non-productive cough. Per family report, pt was diagnosed with pneumonia earlier today and they are requesting that the pt receive treatment in the hospital. Pt denies chest pain, abdominal pain, leg pain, vomiting, diarrhea, fever, and any other symptoms. She denies PMHx of asthma or COPD.    The history is provided by the patient, medical records and a relative. No language interpreter was used.    Past Medical History:  Diagnosis Date  . Alzheimer disease   . 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 CAD (coronary artery disease)   . CORONARY ARTERY DISEASE 02/14/2007  . CVA 02/10/2008  . DEMENTIA 01/20/2010  . Edema   . ENDOCARDITIS 09/29/2010  . GERD (gastroesophageal reflux disease)   . Glaucoma   . Gout   . HYPERLIPIDEMIA 02/14/2007  . HYPERTENSION 02/14/2007  . Lack of coordination   . Morbid obesity ([place])   . Myocardial infarct ([place])   . MYOCARDIAL INFARCTION, HX OF 02/14/2007  . OBESITY 01/16/2009  . OBSTRUCTIVE SLEEP APNEA 05/27/2007  . 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 Postmenopausal bleeding   . Unspecified urinary incontinence     Patient Active Problem List   Diagnosis Date Noted  . Lipoma of back 09/15/2016  . Chronic anemia 07/28/2016  . Uterine mass 06/26/2016  . Seizure ([place]) 03/30/2016  . Hypokalemia 03/30/2016  . Headache 01/06/2016  . Essential hypertension  05/06/2015  . Postmenopausal bleeding 07/05/2013  . ENDOCARDITIS 09/29/2010  . Dementia 01/20/2010  . Osteoarthrosis involving lower leg 05/01/2009  . OBESITY 01/16/2009  . History of stroke 02/10/2008  . Obstructive sleep apnea 05/27/2007  . Hyperlipidemia LDL goal <100 02/14/2007  . MYOCARDIAL INFARCTION, HX OF 02/14/2007  . Coronary atherosclerosis 02/14/2007    Past Surgical History:  Procedure Laterality Date  . TUBAL LIGATION      OB History    Gravida Para Term Preterm AB Living   7 7 7     7    SAB TAB Ectopic Multiple Live Births                   Home Medications    Prior to Admission medications   Medication Sig Start Date End Date Taking? Authorizing Provider  acetaminophen (TYLENOL) 325 MG tablet Take 2 tablets (650 mg total) by mouth every 6 (six) hours as needed for mild pain (or Fever >/= 101). 04/02/16   Robbie Lis, MD  aspirin ([employer] CHILDRENS ASPIRIN) 81 MG chewable tablet TAKE 1 TABLET (81 MG TOTAL) BY MOUTH DAILY. 05/03/15   Brunetta Jeans, PA-C  atorvastatin (LIPITOR) 40 MG tablet Take 1 tablet (40 mg total) by mouth daily. 01/10/16   Hoyt Koch, MD  Calcium-Vitamin D 600-200 MG-UNIT tablet Take 1 tablet by mouth daily.    [provider]  carvedilol (COREG) 25 MG tablet TAKE 1 TABLET BY MOUTH TWICE A DAY WITH A MEAL  01/10/16   Hoyt Koch, MD  donepezil (ARICEPT) 10 MG tablet Take 1 tablet (10 mg total) by mouth at bedtime. 01/10/16   Hoyt Koch, MD  ferrous sulfate 325 (65 FE) MG tablet Take 325 mg by mouth daily with breakfast.    [provider]  furosemide (LASIX) 40 MG tablet Take 1 tablet (40 mg total) by mouth daily. 01/10/16   Hoyt Koch, MD  latanoprost (XALATAN) 0.005 % ophthalmic solution Place 1 drop into both eyes at bedtime.    [provider]  levETIRAcetam (KEPPRA) 500 MG tablet Take 1 tablet (500 mg total) by mouth 2 (two) times daily. 04/02/16   Robbie Lis, MD    lisinopril (PRINIVIL,ZESTRIL) 40 MG tablet Take 1 tablet (40 mg total) by mouth daily. 01/10/16   Hoyt Koch, MD  Melatonin 3 MG TABS Take 1 tablet by mouth at bedtime.    [provider]  memantine (NAMENDA XR) 28 MG CP24 24 hr capsule Take 28 mg by mouth daily.    [provider]  sertraline (ZOLOFT) 50 MG tablet Take 50 mg by mouth at bedtime.    [provider]    Family History Family History  Problem Relation Age of Onset  . Heart failure Mother        Deceased  . Cancer Mother   . Heart disease Father        Deceased  . Kidney disease Brother        Deceased  . Stroke Sister   . Hypertension Brother   . Hypertension Sister   . Heart disease Sister        Deceased  . Hypertension Sister        x2  . Hypertension Son        x2  . Glaucoma Son     Social History Social History  Substance Use Topics  . Smoking status: Never Smoker  . Smokeless tobacco: Never Used  . Alcohol use No     Allergies   Patient has no known allergies.   Review of Systems Review of Systems  Unable to perform ROS: Dementia   Physical Exam Updated Vital Signs BP (!) 144/81   Pulse 86   Temp 98.4 F (36.9 C) (Oral)   Resp 14   Ht 5\' 2"  (1.575 m)   Wt 218 lb (98.9 kg)   SpO2 94%   BMI 39.87 kg/m   Physical Exam  Constitutional: She appears well-developed and well-nourished. No distress.  NAD. Able to speak in complete sentences.   HENT:  Head: Normocephalic and atraumatic.  Mouth/Throat: Oropharynx is clear and moist. No oropharyngeal exudate.  Eyes: Conjunctivae and EOM are normal. Pupils are equal, round, and reactive to light.  Neck: Normal range of motion. Neck supple.  No meningismus.  Cardiovascular: Normal rate, regular rhythm, normal heart sounds and intact distal pulses.   No murmur heard. Pulmonary/Chest: Effort normal and breath sounds normal. No respiratory distress.  Bibasilar crackles noted.  Abdominal: Soft. There is no  tenderness. There is no rebound and no guarding.  Musculoskeletal: Normal range of motion. She exhibits edema. She exhibits no tenderness.  +1 pretibial edema bilaterally   Neurological: She is alert. No cranial nerve deficit. She exhibits normal muscle tone. Coordination normal.   5/5 strength throughout. CN 2-12 intact.Equal grip strength.   Skin: Skin is warm.  Psychiatric: She has a normal mood and affect. Her behavior is normal.  Nursing note and  vitals reviewed.    ED Treatments / Results  DIAGNOSTIC STUDIES:  Oxygen Saturation is 94% on RA, normal by my interpretation.    COORDINATION OF CARE:  11:01 PM Discussed treatment plan which included  IV fluids and Antibiotics with pt family at bedside and pt family agreed to plan.  Labs (all labs ordered are listed, but only abnormal results are displayed) Labs Reviewed  COMPREHENSIVE METABOLIC PANEL - Abnormal; Notable for the following:       Result Value   Potassium 3.1 (*)    Glucose, Bld 217 (*)    BUN 23 (*)    Calcium 7.9 (*)    Total Protein 5.5 (*)    Albumin 2.6 (*)    ALT 12 (*)    All other components within normal limits  I-STAT CG4 LACTIC ACID, ED - Abnormal; Notable for the following:    Lactic Acid, Venous 1.94 (*)    All other components within normal limits  I-STAT ARTERIAL BLOOD GAS, ED - Abnormal; Notable for the following:    pCO2 arterial 52.6 (*)    Bicarbonate 32.8 (*)    Acid-Base Excess 7.0 (*)    All other components within normal limits  CULTURE, BLOOD (ROUTINE X 2)  CULTURE, BLOOD (ROUTINE X 2)  GRAM STAIN  CBC WITH DIFFERENTIAL/PLATELET  BRAIN NATRIURETIC PEPTIDE  TROPONIN I  PROCALCITONIN  STREP PNEUMONIAE URINARY ANTIGEN  LEGIONELLA PNEUMOPHILA SEROGP 1 UR AG  I-STAT CG4 LACTIC ACID, ED    EKG  EKG Interpretation  Date/Time:  Friday May 14 2017 23:19:58 EDT Ventricular Rate:  70 PR Interval:    QRS Duration: 93 QT Interval:  399 QTC Calculation: 431 R Axis:   48 Text  Interpretation:  Sinus rhythm No significant change was found Confirmed by Ezequiel Essex [phone]) on 05/14/2017 11:30:15 PM       Radiology Dg Chest 2 View  Result Date: 05/14/2017 CLINICAL DATA:  Pneumonia, hypertension EXAM: CHEST  2 VIEW COMPARISON:  09/16/2015 FINDINGS: Bilateral pleural effusions, left greater than right. Bilateral lower lung atelectasis or pneumonia. There is cardiomegaly with central congestion and moderate perihilar edema. No pneumothorax. IMPRESSION: 1. Cardiomegaly with vascular congestion and moderate perihilar edema 2. Left greater than right pleural effusions with dense bibasilar atelectasis or pneumonia. Electronically Signed   By: Donavan Foil M.D.   On: 05/14/2017 22:38    Procedures Procedures (including critical care time)  Medications Ordered in ED Medications  cefTRIAXone (ROCEPHIN) 1 g in dextrose 5 % 50 mL IVPB (not administered)  azithromycin (ZITHROMAX) 500 mg in dextrose 5 % 250 mL IVPB (500 mg Intravenous New Bag/Given 05/15/17 0308)  enoxaparin (LOVENOX) injection 40 mg (not administered)  acetaminophen (TYLENOL) tablet 650 mg (not administered)    Or  acetaminophen (TYLENOL) suppository 650 mg (not administered)  ondansetron (ZOFRAN) tablet 4 mg (not administered)    Or  ondansetron (ZOFRAN) injection 4 mg (not administered)  furosemide (LASIX) injection 40 mg (40 mg Intravenous Given 05/14/17 2319)  vancomycin (VANCOCIN) IVPB 1000 mg/200 mL premix (0 mg Intravenous Stopped 05/15/17 0151)  ceFEPIme (MAXIPIME) 2 g in dextrose 5 % 50 mL IVPB (0 g Intravenous Stopped 05/15/17 0042)     Initial Impression / Assessment and Plan / ED Course  I have reviewed the triage vital signs and the nursing notes.  Pertinent labs & imaging results that were available during my care of the patient were reviewed by me and considered in my medical decision making (see chart for details).  Patient from rehabilitation facility with coughing and shortness of  breath. Family was told her outpatient x-ray showed pneumonia. Found to have new oxygen requirement.  Patient in no distress. She has crackles at the bases. X-ray is more consistent with heart failure rather than pneumonia.  Patient will be given IV Lasix as well as antibiotics for possible pneumonia.  Blood cultures obtained.  Given CXR appearance, no fluids given. BNP normal.   No cough or fever. No respiratory distress. Will treat for PNA.  Lasix given for likely CHF exacerbation too. D/w Dr. Loleta Books.   Final Clinical Impressions(s) / ED Diagnoses   Final diagnoses:  Community acquired pneumonia, unspecified laterality  Acute on chronic diastolic congestive heart failure (HCC)    New Prescriptions New Prescriptions   No medications on file   I personally performed the services described in this documentation, which was scribed in my presence. The recorded information has been reviewed and is accurate.    Ezequiel Essex, MD 05/15/17 [phone]

## 2017-05-14 NOTE — ED Notes (Signed)
Taken to xray at this time. 

## 2017-05-15 ENCOUNTER — Encounter (HOSPITAL_COMMUNITY): Payer: Self-pay | Admitting: Family Medicine

## 2017-05-15 DIAGNOSIS — H409 Unspecified glaucoma: Secondary | ICD-10-CM | POA: Diagnosis present

## 2017-05-15 DIAGNOSIS — I240 Acute coronary thrombosis not resulting in myocardial infarction: Secondary | ICD-10-CM | POA: Diagnosis not present

## 2017-05-15 DIAGNOSIS — I10 Essential (primary) hypertension: Secondary | ICD-10-CM | POA: Diagnosis not present

## 2017-05-15 DIAGNOSIS — M6281 Muscle weakness (generalized): Secondary | ICD-10-CM | POA: Diagnosis not present

## 2017-05-15 DIAGNOSIS — J90 Pleural effusion, not elsewhere classified: Secondary | ICD-10-CM | POA: Diagnosis not present

## 2017-05-15 DIAGNOSIS — C773 Secondary and unspecified malignant neoplasm of axilla and upper limb lymph nodes: Secondary | ICD-10-CM | POA: Diagnosis present

## 2017-05-15 DIAGNOSIS — D649 Anemia, unspecified: Secondary | ICD-10-CM

## 2017-05-15 DIAGNOSIS — I110 Hypertensive heart disease with heart failure: Secondary | ICD-10-CM | POA: Diagnosis present

## 2017-05-15 DIAGNOSIS — R569 Unspecified convulsions: Secondary | ICD-10-CM

## 2017-05-15 DIAGNOSIS — K219 Gastro-esophageal reflux disease without esophagitis: Secondary | ICD-10-CM | POA: Diagnosis present

## 2017-05-15 DIAGNOSIS — Z841 Family history of disorders of kidney and ureter: Secondary | ICD-10-CM | POA: Diagnosis not present

## 2017-05-15 DIAGNOSIS — G4733 Obstructive sleep apnea (adult) (pediatric): Secondary | ICD-10-CM | POA: Diagnosis not present

## 2017-05-15 DIAGNOSIS — I5033 Acute on chronic diastolic (congestive) heart failure: Secondary | ICD-10-CM | POA: Diagnosis not present

## 2017-05-15 DIAGNOSIS — R627 Adult failure to thrive: Secondary | ICD-10-CM | POA: Diagnosis not present

## 2017-05-15 DIAGNOSIS — J189 Pneumonia, unspecified organism: Principal | ICD-10-CM

## 2017-05-15 DIAGNOSIS — J9811 Atelectasis: Secondary | ICD-10-CM | POA: Diagnosis not present

## 2017-05-15 DIAGNOSIS — Z515 Encounter for palliative care: Secondary | ICD-10-CM | POA: Diagnosis not present

## 2017-05-15 DIAGNOSIS — Z823 Family history of stroke: Secondary | ICD-10-CM | POA: Diagnosis not present

## 2017-05-15 DIAGNOSIS — J9601 Acute respiratory failure with hypoxia: Secondary | ICD-10-CM | POA: Diagnosis present

## 2017-05-15 DIAGNOSIS — J918 Pleural effusion in other conditions classified elsewhere: Secondary | ICD-10-CM | POA: Diagnosis not present

## 2017-05-15 DIAGNOSIS — F0390 Unspecified dementia without behavioral disturbance: Secondary | ICD-10-CM | POA: Diagnosis not present

## 2017-05-15 DIAGNOSIS — I252 Old myocardial infarction: Secondary | ICD-10-CM | POA: Diagnosis not present

## 2017-05-15 DIAGNOSIS — I2510 Atherosclerotic heart disease of native coronary artery without angina pectoris: Secondary | ICD-10-CM

## 2017-05-15 DIAGNOSIS — Z7982 Long term (current) use of aspirin: Secondary | ICD-10-CM | POA: Diagnosis not present

## 2017-05-15 DIAGNOSIS — F0280 Dementia in other diseases classified elsewhere without behavioral disturbance: Secondary | ICD-10-CM | POA: Diagnosis not present

## 2017-05-15 DIAGNOSIS — H548 Legal blindness, as defined in USA: Secondary | ICD-10-CM | POA: Diagnosis not present

## 2017-05-15 DIAGNOSIS — J9600 Acute respiratory failure, unspecified whether with hypoxia or hypercapnia: Secondary | ICD-10-CM | POA: Diagnosis not present

## 2017-05-15 DIAGNOSIS — E785 Hyperlipidemia, unspecified: Secondary | ICD-10-CM | POA: Diagnosis present

## 2017-05-15 DIAGNOSIS — J910 Malignant pleural effusion: Secondary | ICD-10-CM | POA: Diagnosis present

## 2017-05-15 DIAGNOSIS — Z66 Do not resuscitate: Secondary | ICD-10-CM | POA: Diagnosis not present

## 2017-05-15 DIAGNOSIS — Z8249 Family history of ischemic heart disease and other diseases of the circulatory system: Secondary | ICD-10-CM | POA: Diagnosis not present

## 2017-05-15 DIAGNOSIS — R0602 Shortness of breath: Secondary | ICD-10-CM | POA: Diagnosis not present

## 2017-05-15 DIAGNOSIS — D500 Iron deficiency anemia secondary to blood loss (chronic): Secondary | ICD-10-CM | POA: Diagnosis not present

## 2017-05-15 DIAGNOSIS — R0902 Hypoxemia: Secondary | ICD-10-CM | POA: Diagnosis not present

## 2017-05-15 DIAGNOSIS — R1312 Dysphagia, oropharyngeal phase: Secondary | ICD-10-CM | POA: Diagnosis not present

## 2017-05-15 DIAGNOSIS — E662 Morbid (severe) obesity with alveolar hypoventilation: Secondary | ICD-10-CM | POA: Diagnosis present

## 2017-05-15 DIAGNOSIS — G309 Alzheimer's disease, unspecified: Secondary | ICD-10-CM | POA: Diagnosis present

## 2017-05-15 DIAGNOSIS — M170 Bilateral primary osteoarthritis of knee: Secondary | ICD-10-CM | POA: Diagnosis present

## 2017-05-15 DIAGNOSIS — C384 Malignant neoplasm of pleura: Secondary | ICD-10-CM | POA: Diagnosis not present

## 2017-05-15 DIAGNOSIS — Z6839 Body mass index (BMI) 39.0-39.9, adult: Secondary | ICD-10-CM | POA: Diagnosis not present

## 2017-05-15 DIAGNOSIS — M109 Gout, unspecified: Secondary | ICD-10-CM | POA: Diagnosis present

## 2017-05-15 DIAGNOSIS — C50911 Malignant neoplasm of unspecified site of right female breast: Secondary | ICD-10-CM | POA: Diagnosis not present

## 2017-05-15 DIAGNOSIS — Z8673 Personal history of transient ischemic attack (TIA), and cerebral infarction without residual deficits: Secondary | ICD-10-CM | POA: Diagnosis not present

## 2017-05-15 DIAGNOSIS — Z809 Family history of malignant neoplasm, unspecified: Secondary | ICD-10-CM | POA: Diagnosis not present

## 2017-05-15 DIAGNOSIS — Z7189 Other specified counseling: Secondary | ICD-10-CM | POA: Diagnosis not present

## 2017-05-15 LAB — COMPREHENSIVE METABOLIC PANEL
ALT: 12 U/L — ABNORMAL LOW (ref 14–54)
AST: 24 U/L (ref 15–41)
Albumin: 2.6 g/dL — ABNORMAL LOW (ref 3.5–5.0)
Alkaline Phosphatase: 71 U/L (ref 38–126)
Anion gap: 9 (ref 5–15)
BILIRUBIN TOTAL: 1 mg/dL (ref 0.3–1.2)
BUN: 23 mg/dL — AB (ref 6–20)
CO2: 27 mmol/L (ref 22–32)
CREATININE: 0.86 mg/dL (ref 0.44–1.00)
Calcium: 7.9 mg/dL — ABNORMAL LOW (ref 8.9–10.3)
Chloride: 106 mmol/L (ref 101–111)
GFR calc Af Amer: >60 mL/min (ref >60)
Glucose, Bld: 217 mg/dL — ABNORMAL HIGH (ref 65–99)
POTASSIUM: 3.1 mmol/L — AB (ref 3.5–5.1)
Sodium: 142 mmol/L (ref 135–145)
TOTAL PROTEIN: 5.5 g/dL — AB (ref 6.5–8.1)

## 2017-05-15 LAB — I-STAT CG4 LACTIC ACID, ED: Lactic Acid, Venous: 1.55 mmol/L (ref 0.5–1.9)

## 2017-05-15 LAB — PROCALCITONIN: Procalcitonin: <0.1 ng/mL

## 2017-05-15 LAB — BRAIN NATRIURETIC PEPTIDE: B Natriuretic Peptide: 26.4 pg/mL (ref 0.0–100.0)

## 2017-05-15 LAB — TROPONIN I

## 2017-05-15 LAB — STREP PNEUMONIAE URINARY ANTIGEN: STREP PNEUMO URINARY ANTIGEN: NEGATIVE

## 2017-05-15 LAB — MRSA PCR SCREENING: MRSA by PCR: NEGATIVE

## 2017-05-15 MED ORDER — DEXTROSE 5 % IV SOLN
1.0000 g | INTRAVENOUS | Status: DC
  Administered 2017-05-15 – 2017-05-19 (×5): 1 g via INTRAVENOUS
  Filled 2017-05-15 (×6): qty 10

## 2017-05-15 MED ORDER — ACETAMINOPHEN 325 MG PO TABS
650.0000 mg | ORAL_TABLET | Freq: Four times a day (QID) | ORAL | Status: DC | PRN
  Administered 2017-05-15: 650 mg via ORAL
  Filled 2017-05-15: qty 2

## 2017-05-15 MED ORDER — ASPIRIN 81 MG PO CHEW
81.0000 mg | CHEWABLE_TABLET | Freq: Every day | ORAL | Status: DC
  Administered 2017-05-15 – 2017-05-20 (×6): 81 mg via ORAL
  Filled 2017-05-15 (×6): qty 1

## 2017-05-15 MED ORDER — ONDANSETRON HCL 4 MG/2ML IJ SOLN: 4.0000 mg | Freq: Four times a day (QID) | INTRAMUSCULAR | Status: DC | PRN

## 2017-05-15 MED ORDER — ENOXAPARIN SODIUM 40 MG/0.4ML ~~LOC~~ SOLN
40.0000 mg | SUBCUTANEOUS | Status: DC
  Administered 2017-05-15 – 2017-05-20 (×6): 40 mg via SUBCUTANEOUS
  Filled 2017-05-15 (×6): qty 0.4

## 2017-05-15 MED ORDER — CARVEDILOL 12.5 MG PO TABS
25.0000 mg | ORAL_TABLET | Freq: Two times a day (BID) | ORAL | Status: DC
Start: 2017-05-15 — End: 2017-05-20
  Administered 2017-05-15 – 2017-05-20 (×11): 25 mg via ORAL
  Filled 2017-05-15 (×13): qty 2

## 2017-05-15 MED ORDER — AZITHROMYCIN 500 MG IV SOLR
500.0000 mg | Freq: Every day | INTRAVENOUS | Status: DC
  Administered 2017-05-15 – 2017-05-16 (×2): 500 mg via INTRAVENOUS
  Filled 2017-05-15 (×2): qty 500

## 2017-05-15 MED ORDER — FERROUS SULFATE 325 (65 FE) MG PO TABS
325.0000 mg | ORAL_TABLET | Freq: Every day | ORAL | Status: DC
  Administered 2017-05-15 – 2017-05-20 (×6): 325 mg via ORAL
  Filled 2017-05-15 (×6): qty 1

## 2017-05-15 MED ORDER — ACETAMINOPHEN 650 MG RE SUPP
650.0000 mg | Freq: Four times a day (QID) | RECTAL | Status: DC | PRN
Start: 2017-05-15 — End: 2017-05-20

## 2017-05-15 MED ORDER — SERTRALINE HCL 50 MG PO TABS
50.0000 mg | ORAL_TABLET | Freq: Every day | ORAL | Status: DC
  Administered 2017-05-15 – 2017-05-19 (×5): 50 mg via ORAL
  Filled 2017-05-15 (×5): qty 1

## 2017-05-15 MED ORDER — DONEPEZIL HCL 10 MG PO TABS
10.0000 mg | ORAL_TABLET | Freq: Every day | ORAL | Status: DC
  Administered 2017-05-15 – 2017-05-19 (×5): 10 mg via ORAL
  Filled 2017-05-15 (×5): qty 1

## 2017-05-15 MED ORDER — MEMANTINE HCL ER 28 MG PO CP24
28.0000 mg | ORAL_CAPSULE | Freq: Every day | ORAL | Status: DC
  Administered 2017-05-15 – 2017-05-20 (×6): 28 mg via ORAL
  Filled 2017-05-15 (×6): qty 1

## 2017-05-15 MED ORDER — ATORVASTATIN CALCIUM 40 MG PO TABS
40.0000 mg | ORAL_TABLET | Freq: Every day | ORAL | Status: DC
  Administered 2017-05-15 – 2017-05-20 (×6): 40 mg via ORAL
  Filled 2017-05-15 (×6): qty 1

## 2017-05-15 MED ORDER — FUROSEMIDE 20 MG PO TABS
40.0000 mg | ORAL_TABLET | Freq: Every day | ORAL | Status: DC
  Administered 2017-05-15 – 2017-05-20 (×6): 40 mg via ORAL
  Filled 2017-05-15 (×6): qty 2

## 2017-05-15 MED ORDER — MELATONIN 3 MG PO TABS
1.0000 | ORAL_TABLET | Freq: Every day | ORAL | Status: DC
  Administered 2017-05-15 – 2017-05-19 (×5): 3 mg via ORAL
  Filled 2017-05-15 (×6): qty 1

## 2017-05-15 MED ORDER — POTASSIUM CHLORIDE CRYS ER 20 MEQ PO TBCR
40.0000 meq | EXTENDED_RELEASE_TABLET | Freq: Once | ORAL | Status: AC
  Administered 2017-05-15: 40 meq via ORAL
  Filled 2017-05-15: qty 2

## 2017-05-15 MED ORDER — LEVETIRACETAM 500 MG PO TABS
500.0000 mg | ORAL_TABLET | Freq: Two times a day (BID) | ORAL | Status: DC
  Administered 2017-05-15 – 2017-05-20 (×11): 500 mg via ORAL
  Filled 2017-05-15 (×11): qty 1

## 2017-05-15 MED ORDER — LATANOPROST 0.005 % OP SOLN
1.0000 [drp] | Freq: Every day | OPHTHALMIC | Status: DC
  Administered 2017-05-15 – 2017-05-19 (×5): 1 [drp] via OPHTHALMIC
  Filled 2017-05-15: qty 2.5

## 2017-05-15 MED ORDER — ONDANSETRON HCL 4 MG PO TABS
4.0000 mg | ORAL_TABLET | Freq: Four times a day (QID) | ORAL | Status: DC | PRN
Start: 2017-05-15 — End: 2017-05-20

## 2017-05-15 NOTE — Progress Notes (Signed)
[facility]                                                                              Patient Demographics  Diane Frey, is a 76 y.o. female, DOB - 03/08/1941, CXK:[id number]  Admit date - 05/14/2017   Admitting Physician Edwin Dada, MD  Outpatient Primary MD for the patient is Hendricks Limes, MD  Outpatient specialists:   LOS - 0  days   Medical records reviewed and are as summarized below:    Chief Complaint  Patient presents with  . 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 Per history, patient was wheezing and gurgling, coughing this week was noticed to be hypoxic at the facility. Chest x-ray showed bibasilar pneumonia.   Assessment & Plan    Principal Problem:   Community acquired pneumonia - Continue IV Zithromax, ceftriaxone. - Urinary strep antigen negative - Pro calcitonin less than 0.1, lactic acid 1.5 BNP 26 - Chest x-ray showed left greater than right pleural effusions with dense bibasilar atelectasis or pneumonia  Active Problems:  Suspected CHF:  Has hx of CAD, on Lasix outpatient. No peripheral edema, BNP normal  -Continue furosemide - Positive balance of 736 mL, follow I's and O's   Hypertension:  -Continue beta-blocker -Continue statin, aspirin   Dementia:  -Continue sertraline, Namenda, Aricept  Chronic Anemia:  From vaginal bleeding.  Currently not anemic. -H&H is stable, continue iron  Seizures:  In setting of old AVM, treated at OSH.  No reported seizure activity recently. -Continue Keppra   Hypokalemia Replaced   Code Status: full  DVT Prophylaxis:  Lovenox Family Communication: Discussed in detail with the patient, all imaging results, lab results explained to the patient   Disposition Plan:   Time Spent in minutes   25  minutes  Procedures:  None  Consultants:   None  Antimicrobials:   Zithromax 6/9  Rocephin 6/9   Medications  Scheduled Meds: . aspirin  81 mg Oral Daily  . atorvastatin  40 mg Oral Daily  . carvedilol  25 mg Oral BID WC  . donepezil  10 mg Oral QHS  . enoxaparin (LOVENOX) injection  40 mg Subcutaneous Q24H  . ferrous sulfate  325 mg Oral Q breakfast  . furosemide  40 mg Oral Daily  . latanoprost  1 drop Both Eyes QHS  . levETIRAcetam  500 mg Oral BID  . Melatonin  1 tablet Oral QHS  . memantine  28 mg Oral Daily  . sertraline  50 mg Oral QHS   Continuous Infusions: . azithromycin 500 mg (05/15/17 0308)  . cefTRIAXone (ROCEPHIN)  IV Stopped (05/15/17 1011)   PRN Meds:.acetaminophen **OR** acetaminophen, ondansetron **OR** ondansetron (ZOFRAN) IV   Antibiotics   Anti-infectives    Start     Dose/Rate Route Frequency Ordered Stop   05/15/17 1000  cefTRIAXone (ROCEPHIN) 1 g in dextrose 5 % 50 mL IVPB     1 g 100 mL/hr over 30 Minutes Intravenous Every  24 hours 05/15/17 0259 05/22/17 0959   05/15/17 0315  azithromycin (ZITHROMAX) 500 mg in dextrose 5 % 250 mL IVPB     500 mg 250 mL/hr over 60 Minutes Intravenous Daily 05/15/17 0259 05/22/17 0959   05/14/17 2330  vancomycin (VANCOCIN) IVPB 1000 mg/200 mL premix     1,000 mg 200 mL/hr over 60 Minutes Intravenous  Once 05/14/17 2315 05/15/17 0151   05/14/17 2330  ceFEPIme (MAXIPIME) 2 g in dextrose 5 % 50 mL IVPB     2 g 100 mL/hr over 30 Minutes Intravenous  Once 05/14/17 2315 05/15/17 0042        Subjective:   Ashlinn Hemrick was seen and examined today.  Difficult to obtain review of systems from the patient, no acute issues, appears to be comfortable. Afebrile. No pain.   Objective:   Vitals:   05/15/17 0245 05/15/17 0300 05/15/17 0329 05/15/17 0436  BP: (!) 136/93 (!) 146/82  (!) 158/98  Pulse: 79 75  74  Resp: 13 13  18   Temp:   98.7 F (37.1 C) 98.3 F (36.8 C)  TempSrc:   Oral Oral  SpO2:  95% 95%  92%  Weight:    91.7 kg (202 lb 1.6 oz)  Height:        Intake/Output Summary (Last 24 hours) at 05/15/17 1206 Last data filed at 05/15/17 0958  Gross per 24 hour  Intake              736 ml  Output                0 ml  Net              736 ml     Wt Readings from Last 3 Encounters:  05/15/17 91.7 kg (202 lb 1.6 oz)  05/05/17 91.4 kg (201 lb 9.6 oz)  03/24/17 93.1 kg (205 lb 3.2 oz)     Exam  General: Alert and oriented x Self, NAD  Eyes: PERRLA, EOMI, Anicteric Sclera,  HEENT:  Atraumatic, normocephalic, normal oropharynx  Cardiovascular: S1 S2 auscultated, no rubs, murmurs or gallops. Regular rate and rhythm.  Respiratory: Decreased breath sound at the bases  Gastrointestinal: Soft, nontender, nondistended, + bowel sounds  Ext: no pedal edema bilaterally  Neuro: moving all 4 extremities  Musculoskeletal: No digital cyanosis, clubbing  Skin: No rashes  Psych: confused alert and oriented self   Data Reviewed:  I have personally reviewed following labs and imaging studies  Micro Results Recent Results (from the past 240 hour(s))  MRSA PCR Screening     Status: None   Collection Time: 05/15/17  5:16 AM  Result Value Ref Range Status   MRSA by PCR NEGATIVE NEGATIVE Final    Comment:        The GeneXpert MRSA Assay (FDA approved for NASAL specimens only), is one component of a comprehensive MRSA colonization surveillance program. It is not intended to diagnose MRSA infection nor to guide or monitor treatment for MRSA infections.     Radiology Reports Dg Chest 2 View  Result Date: 05/14/2017 CLINICAL DATA:  Pneumonia, hypertension EXAM: CHEST  2 VIEW COMPARISON:  09/16/2015 FINDINGS: Bilateral pleural effusions, left greater than right. Bilateral lower lung atelectasis or pneumonia. There is cardiomegaly with central congestion and moderate perihilar edema. No pneumothorax. IMPRESSION: 1. Cardiomegaly with vascular congestion and moderate  perihilar edema 2. Left greater than right pleural effusions with dense bibasilar atelectasis or pneumonia. Electronically Signed   By: Maudie Mercury  Francoise Ceo M.D.   On: 05/14/2017 22:38    Lab Data:  CBC:  Recent Labs Lab 05/14/17 2222  WBC 7.0  NEUTROABS 4.7  HGB 13.2  HCT 42.2  MCV 92.7  PLT 740   Basic Metabolic Panel:  Recent Labs Lab 05/15/17 0130  NA 142  K 3.1*  CL 106  CO2 27  GLUCOSE 217*  BUN 23*  CREATININE 0.86  CALCIUM 7.9*   GFR: Estimated Creatinine Clearance: 60.4 mL/min (by C-G formula based on SCr of 0.86 mg/dL). Liver Function Tests:  Recent Labs Lab 05/15/17 0130  AST 24  ALT 12*  ALKPHOS 71  BILITOT 1.0  PROT 5.5*  ALBUMIN 2.6*   No results for input(s): LIPASE, AMYLASE in the last 168 hours. No results for input(s): AMMONIA in the last 168 hours. Coagulation Profile: No results for input(s): INR, PROTIME in the last 168 hours. Cardiac Enzymes:  Recent Labs Lab 05/15/17 0130  TROPONINI <0.03   BNP (last 3 results) No results for input(s): PROBNP in the last 8760 hours. HbA1C: No results for input(s): HGBA1C in the last 72 hours. CBG: No results for input(s): GLUCAP in the last 168 hours. Lipid Profile: No results for input(s): CHOL, HDL, LDLCALC, TRIG, CHOLHDL, LDLDIRECT in the last 72 hours. Thyroid Function Tests: No results for input(s): TSH, T4TOTAL, FREET4, T3FREE, THYROIDAB in the last 72 hours. Anemia Panel: No results for input(s): VITAMINB12, FOLATE, FERRITIN, TIBC, IRON, RETICCTPCT in the last 72 hours. Urine analysis:    Component Value Date/Time   COLORURINE STRAW (A) 03/30/2016 1407   APPEARANCEUR CLEAR 03/30/2016 1407   LABSPEC 1.006 03/30/2016 1407   PHURINE 7.0 03/30/2016 1407   GLUCOSEU NEGATIVE 03/30/2016 1407   GLUCOSEU NEGATIVE 06/03/2015 1344   HGBUR LARGE (A) 03/30/2016 1407   BILIRUBINUR NEGATIVE 03/30/2016 1407   BILIRUBINUR neg 05/17/2015 1134   KETONESUR NEGATIVE 03/30/2016 1407   PROTEINUR  NEGATIVE 03/30/2016 1407   UROBILINOGEN 0.2 09/16/2015 1508   NITRITE NEGATIVE 03/30/2016 1407   LEUKOCYTESUR NEGATIVE 03/30/2016 1407     Rashaad Hallstrom M.D. [facility] 05/15/2017, 12:06 PM  Pager: [phone] Between 7am to 7pm - call Pager - [phone]  After 7pm go to [email or web address] - password [email or web address]  Call night coverage person covering after 7pm

## 2017-05-15 NOTE — Progress Notes (Signed)
Pt arrived in 5W36 in the company of ED tech. Pt was assessed to be A xO x 1, to self. Pt's belongings excluded medications. 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 A soft touch call bell was given to pt and was educated on how to use it. Skin assessment was completed and vital signs were assessed and filed. No complaints of pain at this time. Will continue to monitor.

## 2017-05-15 NOTE — ED Notes (Signed)
Incontinent of large amount of urine.  Cleaned up and applied purewick external catheter to wall suction.  Tolerated well.  No distress noted or complaints voiced at this time.

## 2017-05-15 NOTE — H&P (Signed)
History and Physical  Patient Name: Diane Frey     FOY:[id number]    DOB: December 31, 1940    DOA: 05/14/2017 PCP: Hendricks Limes, MD  Patient coming from: [facility]  Chief Complaint: Cough, pneumonia      HPI: Diane Frey is a 76 y.o. 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  Patient has advanced dementia, and so all history collected from family, EMS, and chart.  Evidently, the patient was starting to wheeze or "gurgle" and cough this week, and was noted to be hypoxic at her facility.  A CXR was obtained, that tonight was read as bibasilar pneumonia (CXR report available at bedside).  Family asked for patient to be transported to the ER.  She had no reported fever, respiratory distress, leg swelling, change in mentation.  She has advanced dementia and is not mobile at baseline.  ED course: -Afebrile, heart rate 86, respirations 14, pulse ox 88% on room air, BP 144/81 -Na 142, K 3.1, Cr 0.86, WBC 7K, Hgb 13.2 -CXR showed right effusion and bibasilar opacities -ECG showed normal sinus rhythm -BNP 25 -Troponin negative -Lactic acid 1.9 -Furosemide, Vancomycin and cefepime were administered and TRH were asked to evaluate for pneumonia and suspected CHF flare     ROS: Review of Systems  Unable to perform ROS: Dementia          Past Medical History:  Diagnosis Date  . Alzheimer disease   . 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 CAD (coronary artery disease)   . CORONARY ARTERY DISEASE 02/14/2007  . CVA 02/10/2008  . DEMENTIA 01/20/2010  . Edema   . ENDOCARDITIS 09/29/2010  . GERD (gastroesophageal reflux disease)   . Glaucoma   . Gout   . HYPERLIPIDEMIA 02/14/2007  . HYPERTENSION 02/14/2007  . Lack of coordination   . Morbid obesity ([place])   . Myocardial infarct ([place])   . MYOCARDIAL INFARCTION,  HX OF 02/14/2007  . OBESITY 01/16/2009  . OBSTRUCTIVE SLEEP APNEA 05/27/2007  . 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 Postmenopausal bleeding   . Unspecified urinary incontinence     Past Surgical History:  Procedure Laterality Date  . TUBAL LIGATION      Social History: Patient lives at [facility]. Not ambulatory at baseline.  Never smoker.  Max assist for all personal cares.  No Known Allergies  Family history: family history includes Cancer in her mother; Glaucoma in her son; Heart disease in her father and sister; Heart failure in her mother; Hypertension in her brother, sister, sister, and son; Kidney disease in her brother; Stroke in her sister.  Unable to confirme due to dementia.  Prior to Admission medications   Medication Sig Start Date End Date Taking? Authorizing Provider  acetaminophen (TYLENOL) 325 MG tablet Take 2 tablets (650 mg total) by mouth every 6 (six) hours as needed for mild pain (or Fever >/= 101). 04/02/16   Robbie Lis, MD  aspirin ([facility] ASPIRIN) 81 MG chewable tablet TAKE 1 TABLET (81 MG TOTAL) BY MOUTH DAILY. 05/03/15   Brunetta Jeans, PA-C  atorvastatin (LIPITOR) 40 MG tablet Take 1 tablet (40 mg total) by mouth daily. 01/10/16   Hoyt Koch, MD  Calcium-Vitamin D 600-200 MG-UNIT tablet Take 1 tablet by mouth daily.    [provider]  carvedilol (COREG) 25  MG tablet TAKE 1 TABLET BY MOUTH TWICE A DAY WITH A MEAL 01/10/16   Hoyt Koch, MD  donepezil (ARICEPT) 10 MG tablet Take 1 tablet (10 mg total) by mouth at bedtime. 01/10/16   Hoyt Koch, MD  ferrous sulfate 325 (65 FE) MG tablet Take 325 mg by mouth daily with breakfast.    [provider]  furosemide (LASIX) 40 MG tablet Take 1 tablet (40 mg total) by mouth daily. 01/10/16   Hoyt Koch, MD  latanoprost (XALATAN) 0.005 % ophthalmic solution Place 1 drop into both eyes at bedtime.    [provider]  levETIRAcetam (KEPPRA)  500 MG tablet Take 1 tablet (500 mg total) by mouth 2 (two) times daily. 04/02/16   Robbie Lis, MD  lisinopril (PRINIVIL,ZESTRIL) 40 MG tablet Take 1 tablet (40 mg total) by mouth daily. 01/10/16   Hoyt Koch, MD  Melatonin 3 MG TABS Take 1 tablet by mouth at bedtime.    [provider]  memantine (NAMENDA XR) 28 MG CP24 24 hr capsule Take 28 mg by mouth daily.    [provider]  sertraline (ZOLOFT) 50 MG tablet Take 50 mg by mouth at bedtime.    [provider]       Physical Exam: BP (!) 153/80   Pulse 77   Temp 98.4 F (36.9 C) (Oral)   Resp 12   Ht 5\' 2"  (1.575 m)   Wt 98.9 kg (218 lb)   SpO2 96%   BMI 39.87 kg/m  General appearance: Frail elderly overweight adult female, alert and in no acute distress, chewing gum.   Eyes: Anicteric, conjunctiva pink, lids and lashes normal. Pupils equal, somewhat reactive, she appears to be blind on my exam.    ENT: No nasal deformity, discharge, epistaxis.  Hearing normal. OP moist without lesions.   Neck: No neck masses.  Trachea midline.  No thyromegaly/tenderness. Lymph: No cervical or supraclavicular lymphadenopathy. Skin: Warm and dry.   Cardiac: RRR, nl S1-S2, no murmurs appreciated.  Capillary refill is brisk.  No JVD.  No LE edema.  Radial pulses 2+ and symmetric. Respiratory: Normal respiratory rate and rhythm.  Diminished throughout, no wheezes. Abdomen: Abdomen soft.  No TTP. No ascites, distension, hepatosplenomegaly.   MSK: No deformities or effusions.  No cyanosis or clubbing.  Right arm contractures. Neuro: Cranial nerves 3-12 seem symmetric.  Appears to be blind.  Speech is fluent.  Muscle strength diminished on right.    Psych: Sensorium intact and responding to questions, attention normal.  Pleasant.  Not oriented to place or time.       Labs on Admission:  I have personally reviewed following labs and imaging studies: CBC:  Recent Labs Lab 05/14/17 2222  WBC 7.0  NEUTROABS  4.7  HGB 13.2  HCT 42.2  MCV 92.7  PLT 638   Basic Metabolic Panel: No results for input(s): NA, K, CL, CO2, GLUCOSE, BUN, CREATININE, CALCIUM, MG, PHOS in the last 168 hours. GFR: CrCl cannot be calculated (Patient's most recent lab result is older than the maximum 21 days allowed.).  Liver Function Tests: No results for input(s): AST, ALT, ALKPHOS, BILITOT, PROT, ALBUMIN in the last 168 hours. No results for input(s): LIPASE, AMYLASE in the last 168 hours. No results for input(s): AMMONIA in the last 168 hours. Coagulation Profile: No results for input(s): INR, PROTIME in the last 168 hours. Cardiac Enzymes: No results for input(s): CKTOTAL, CKMB, CKMBINDEX, TROPONINI in the last  168 hours. BNP (last 3 results) No results for input(s): PROBNP in the last 8760 hours. HbA1C: No results for input(s): HGBA1C in the last 72 hours. CBG: No results for input(s): GLUCAP in the last 168 hours. Lipid Profile: No results for input(s): CHOL, HDL, LDLCALC, TRIG, CHOLHDL, LDLDIRECT in the last 72 hours. Thyroid Function Tests: No results for input(s): TSH, T4TOTAL, FREET4, T3FREE, THYROIDAB in the last 72 hours. Anemia Panel: No results for input(s): VITAMINB12, FOLATE, FERRITIN, TIBC, IRON, RETICCTPCT in the last 72 hours. Sepsis Labs: Lactic acid 1.95 Invalid input(s): PROCALCITONIN, LACTICIDVEN No results found for this or any previous visit (from the past 240 hour(s)).       Radiological Exams on Admission: Personally reviewed CXR shows bilateral lower lobe opacities, small effusion: Dg Chest 2 View  Result Date: 05/14/2017 CLINICAL DATA:  Pneumonia, hypertension EXAM: CHEST  2 VIEW COMPARISON:  09/16/2015 FINDINGS: Bilateral pleural effusions, left greater than right. Bilateral lower lung atelectasis or pneumonia. There is cardiomegaly with central congestion and moderate perihilar edema. No pneumothorax. IMPRESSION: 1. Cardiomegaly with vascular congestion and moderate perihilar  edema 2. Left greater than right pleural effusions with dense bibasilar atelectasis or pneumonia. Electronically Signed   By: Donavan Foil M.D.   On: 05/14/2017 22:38    EKG: Independently reviewed. Rate 70, QTc 431, no ST changes.        Assessment/Plan  1. Pneumonia:  Cough, hypoxia, opacity on CXR.  No fever, WBC, SIRS.   -Ceftriaxone and azithromycin IV -Incentive spirometry if able -Check procalcitonin -Check strep pneumo and legionella urinary antigens -Follow blood cultures   2. Suspected CHF:  Has hx of CAD.  Takes Lasix.  No peripheral edema and BNP normal, so suspect opacities on CXR are not cardiogenic, but EF unknown. -Continue furosemide -I/Os, daily weights  3. Hypertension:  -Hold lisinopril until hemodynamics clearer -Continue beta-blocker -Continue statin, aspirin  4. Dementia:  -Continue sertraline -Continue namenda and Aricept -Continue melatonon  5. Anemia:  From vaginal bleeding.  Currently not anemic. -Continue iron  6. Seizures:  In setting of old AVM, treated at OSH.  No reported seizure activity recently. -Continue Keppra      DVT prophylaxis: Lovenox  Code Status: FULL  Family Communication: None present  Disposition Plan: Anticipate IV antibiotics, discharge when O2 saturation improved Consults called: None Admission status: INPATIENT    Medical decision making: Patient seen at 1:45 AM on 05/15/2017.  The patient was discussed with Dr. Wyvonnia Dusky.  What exists of the patient's chart was reviewed in depth and summarized above.  Clinical condition: stable.        Edwin Dada [facility] Pager [phone]     At the time of admission, it appears that the appropriate admission status for this patient is INPATIENT. This is judged to be reasonable and necessary in order to provide the required intensity of service to ensure the patient's safety given the presenting symptoms, physical exam findings, and initial  radiographic and laboratory data in the context of their chronic comorbidities.  Together, these circumstances are felt to place her at high risk for further clinical deterioration threatening life, limb, or organ.   Patient requires inpatient status due to high intensity of service, high risk for further deterioration and high frequency of surveillance required because of this acute illness that poses a threat to life, limb or bodily function.  Factors that support inaptient status include pneumonia in setting of advanced age, nursing home resident, pleural effusion, hypoxia and history  of stroke.  I certify that at the point of admission it is my clinical judgment that the patient will require inpatient hospital care spanning beyond 2 midnights from the point of admission and that early discharge would result in unnecessary risk of decompensation and readmission or threat to life, limb or bodily function.

## 2017-05-16 LAB — CBC
HEMATOCRIT: 39.6 % (ref 36.0–46.0)
Hemoglobin: 12.2 g/dL (ref 12.0–15.0)
MCH: 28.5 pg (ref 26.0–34.0)
MCHC: 30.8 g/dL (ref 30.0–36.0)
MCV: 92.5 fL (ref 78.0–100.0)
PLATELETS: 177 10*3/uL (ref 150–400)
RBC: 4.28 MIL/uL (ref 3.87–5.11)
RDW: 14.3 % (ref 11.5–15.5)
WBC: 6.5 10*3/uL (ref 4.0–10.5)

## 2017-05-16 LAB — BASIC METABOLIC PANEL
Anion gap: 9 (ref 5–15)
BUN: 24 mg/dL — ABNORMAL HIGH (ref 6–20)
CHLORIDE: 103 mmol/L (ref 101–111)
CO2: 32 mmol/L (ref 22–32)
CREATININE: 1.09 mg/dL — AB (ref 0.44–1.00)
Calcium: 9.1 mg/dL (ref 8.9–10.3)
GFR calc non Af Amer: 49 mL/min — ABNORMAL LOW (ref >60)
GFR, EST AFRICAN AMERICAN: 57 mL/min — AB (ref >60)
Glucose, Bld: 87 mg/dL (ref 65–99)
Potassium: 3.7 mmol/L (ref 3.5–5.1)
Sodium: 144 mmol/L (ref 135–145)

## 2017-05-16 LAB — BRAIN NATRIURETIC PEPTIDE: B Natriuretic Peptide: 62.8 pg/mL (ref 0.0–100.0)

## 2017-05-16 MED ORDER — LEVALBUTEROL HCL 0.63 MG/3ML IN NEBU
0.6300 mg | INHALATION_SOLUTION | Freq: Four times a day (QID) | RESPIRATORY_TRACT | Status: DC | PRN
  Administered 2017-05-16: 0.63 mg via RESPIRATORY_TRACT
  Filled 2017-05-16: qty 3

## 2017-05-16 MED ORDER — LEVALBUTEROL HCL 0.63 MG/3ML IN NEBU: 0.6300 mg | INHALATION_SOLUTION | RESPIRATORY_TRACT | Status: DC | PRN

## 2017-05-16 MED ORDER — LEVALBUTEROL HCL 0.63 MG/3ML IN NEBU
0.6300 mg | INHALATION_SOLUTION | RESPIRATORY_TRACT | Status: DC
Start: 2017-05-16 — End: 2017-05-17
  Administered 2017-05-16 – 2017-05-17 (×4): 0.63 mg via RESPIRATORY_TRACT
  Filled 2017-05-16 (×5): qty 3

## 2017-05-16 MED ORDER — METHYLPREDNISOLONE SODIUM SUCC 125 MG IJ SOLR
60.0000 mg | Freq: Four times a day (QID) | INTRAMUSCULAR | Status: DC
  Administered 2017-05-16 – 2017-05-17 (×5): 60 mg via INTRAVENOUS
  Filled 2017-05-16 (×6): qty 2

## 2017-05-16 MED ORDER — IPRATROPIUM BROMIDE 0.02 % IN SOLN
0.5000 mg | RESPIRATORY_TRACT | Status: DC
  Administered 2017-05-16 – 2017-05-17 (×4): 0.5 mg via RESPIRATORY_TRACT
  Filled 2017-05-16 (×5): qty 2.5

## 2017-05-16 MED ORDER — AZITHROMYCIN 500 MG PO TABS
500.0000 mg | ORAL_TABLET | Freq: Every day | ORAL | Status: DC
  Administered 2017-05-17 – 2017-05-20 (×4): 500 mg via ORAL
  Filled 2017-05-16 (×4): qty 1

## 2017-05-16 NOTE — Progress Notes (Signed)
[facility]                                                                              Patient Demographics  Diane Frey, is a 76 y.o. female, DOB - 1941-09-05, BPZ:[id number]  Admit date - 05/14/2017   Admitting Physician Edwin Dada, MD  Outpatient Primary MD for the patient is Hendricks Limes, MD  Outpatient specialists:   LOS - 1  days   Medical records reviewed and are as summarized below:    Chief Complaint  Patient presents with  . 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 Per history, patient was wheezing and gurgling, coughing this week was noticed to be hypoxic at the facility. Chest x-ray showed bibasilar pneumonia.   Assessment & Plan    Principal Problem:   Community acquired pneumonia - Continue IV Zithromax, ceftriaxone. - Urinary strep antigen negative - Pro calcitonin less than 0.1, lactic acid 1.5 BNP 26 - Chest x-ray showed left greater than right pleural effusions with dense bibasilar atelectasis or pneumonia  Active Problems: Acute bronchitis with active wheezing - Diffuse audible Wheezing bilaterally, BNP 62.8 - Placed on scheduled Xopenex and Atrovent, IV Solu-Medrol 60 mg q6hrs  - Placed on as needed Xopenex and flutter valve  Suspected CHF:  Has hx of CAD, on Lasix outpatient. No peripheral edema, BNP normal  -Continue furosemide - Currently euvolemic   Hypertension:  -Continue beta-blocker -Continue statin, aspirin   Dementia:  -Continue sertraline, Namenda, Aricept  Chronic Anemia:  From vaginal bleeding.  Currently not anemic. -H&H is stable, continue iron  Seizures:  In setting of old AVM, treated at OSH.  No reported seizure activity recently. -Continue Keppra   Hypokalemia Replaced   Code Status: full  DVT Prophylaxis:   Lovenox Family Communication: Discussed in detail with the patient, all imaging results, lab results explained to the patient   Disposition Plan:   Time Spent in minutes   25 minutes  Procedures:  None  Consultants:   None  Antimicrobials:   Zithromax 6/9  Rocephin 6/9   Medications  Scheduled Meds: . aspirin  81 mg Oral Daily  . atorvastatin  40 mg Oral Daily  . [START ON 05/17/2017] azithromycin  500 mg Oral Daily  . carvedilol  25 mg Oral BID WC  . donepezil  10 mg Oral QHS  . enoxaparin (LOVENOX) injection  40 mg Subcutaneous Q24H  . ferrous sulfate  325 mg Oral Q breakfast  . furosemide  40 mg Oral Daily  . levalbuterol  0.63 mg Nebulization Q4H   And  . ipratropium  0.5 mg Nebulization Q4H  . latanoprost  1 drop Both Eyes QHS  . levETIRAcetam  500 mg Oral BID  . Melatonin  1 tablet Oral QHS  . memantine  28 mg Oral Daily  . methylPREDNISolone (SOLU-MEDROL) injection  60 mg Intravenous Q6H  . sertraline  50 mg Oral QHS   Continuous Infusions: . cefTRIAXone (ROCEPHIN)  IV Stopped (05/16/17  1011)   PRN Meds:.acetaminophen **OR** acetaminophen, levalbuterol, ondansetron **OR** ondansetron (ZOFRAN) IV   Antibiotics   Anti-infectives    Start     Dose/Rate Route Frequency Ordered Stop   05/17/17 1000  azithromycin (ZITHROMAX) tablet 500 mg     500 mg Oral Daily 05/16/17 1143     05/15/17 1000  cefTRIAXone (ROCEPHIN) 1 g in dextrose 5 % 50 mL IVPB     1 g 100 mL/hr over 30 Minutes Intravenous Every 24 hours 05/15/17 0259 05/22/17 0959   05/15/17 0315  azithromycin (ZITHROMAX) 500 mg in dextrose 5 % 250 mL IVPB  Status:  Discontinued     500 mg 250 mL/hr over 60 Minutes Intravenous Daily 05/15/17 0259 05/16/17 1143   05/14/17 2330  vancomycin (VANCOCIN) IVPB 1000 mg/200 mL premix     1,000 mg 200 mL/hr over 60 Minutes Intravenous  Once 05/14/17 2315 05/15/17 0151   05/14/17 2330  ceFEPIme (MAXIPIME) 2 g in dextrose 5 % 50 mL IVPB     2 g 100 mL/hr over 30  Minutes Intravenous  Once 05/14/17 2315 05/15/17 0042        Subjective:   Diane Frey was seen and examined today. Short of breath and audible wheezing today. No chest pain, fevers or chills. No abdominal pain, nausea or vomiting. No coughing.   Objective:   Vitals:   05/16/17 0012 05/16/17 0540 05/16/17 0802 05/16/17 1108  BP:  (!) 145/84    Pulse:  77    Resp:  16    Temp:  98.4 F (36.9 C)    TempSrc:  Oral    SpO2:  (!) 79% 96% 95%  Weight: 92.7 kg (204 lb 6.4 oz)     Height:        Intake/Output Summary (Last 24 hours) at 05/16/17 1224 Last data filed at 05/16/17 1046  Gross per 24 hour  Intake              780 ml  Output              400 ml  Net              380 ml     Wt Readings from Last 3 Encounters:  05/16/17 92.7 kg (204 lb 6.4 oz)  05/05/17 91.4 kg (201 lb 9.6 oz)  03/24/17 93.1 kg (205 lb 3.2 oz)     Exam  General: Alert and oriented x Self, NAD  Eyes: EOMI PERRLA  HEENT:  atraumatic, normocephalic  Cardiovascular: S1 S2 clear, no MRG, RRR   Respiratory: Diffuse expiratory wheezing bilaterally  Gastrointestinal: Soft, NT, ND, NBS   Ext: no pedal edema  Neuro: moving all 4 extremities  Musculoskeletal: No c/c  Skin: No rashes  Psych: alert and oriented   Data Reviewed:  I have personally reviewed following labs and imaging studies  Micro Results Recent Results (from the past 240 hour(s))  MRSA PCR Screening     Status: None   Collection Time: 05/15/17  5:16 AM  Result Value Ref Range Status   MRSA by PCR NEGATIVE NEGATIVE Final    Comment:        The GeneXpert MRSA Assay (FDA approved for NASAL specimens only), is one component of a comprehensive MRSA colonization surveillance program. It is not intended to diagnose MRSA infection nor to guide or monitor treatment for MRSA infections.     Radiology Reports Dg Chest 2 View  Result Date: 05/14/2017 CLINICAL DATA:  Pneumonia,  hypertension EXAM: CHEST  2 VIEW  COMPARISON:  09/16/2015 FINDINGS: Bilateral pleural effusions, left greater than right. Bilateral lower lung atelectasis or pneumonia. There is cardiomegaly with central congestion and moderate perihilar edema. No pneumothorax. IMPRESSION: 1. Cardiomegaly with vascular congestion and moderate perihilar edema 2. Left greater than right pleural effusions with dense bibasilar atelectasis or pneumonia. Electronically Signed   By: Donavan Foil M.D.   On: 05/14/2017 22:38    Lab Data:  CBC:  Recent Labs Lab 05/14/17 2222 05/16/17 0519  WBC 7.0 6.5  NEUTROABS 4.7  --   HGB 13.2 12.2  HCT 42.2 39.6  MCV 92.7 92.5  PLT 190 967   Basic Metabolic Panel:  Recent Labs Lab 05/15/17 0130 05/16/17 0519  NA 142 144  K 3.1* 3.7  CL 106 103  CO2 27 32  GLUCOSE 217* 87  BUN 23* 24*  CREATININE 0.86 1.09*  CALCIUM 7.9* 9.1   GFR: Estimated Creatinine Clearance: 48 mL/min (A) (by C-G formula based on SCr of 1.09 mg/dL (H)). Liver Function Tests:  Recent Labs Lab 05/15/17 0130  AST 24  ALT 12*  ALKPHOS 71  BILITOT 1.0  PROT 5.5*  ALBUMIN 2.6*   No results for input(s): LIPASE, AMYLASE in the last 168 hours. No results for input(s): AMMONIA in the last 168 hours. Coagulation Profile: No results for input(s): INR, PROTIME in the last 168 hours. Cardiac Enzymes:  Recent Labs Lab 05/15/17 0130  TROPONINI <0.03   BNP (last 3 results) No results for input(s): PROBNP in the last 8760 hours. HbA1C: No results for input(s): HGBA1C in the last 72 hours. CBG: No results for input(s): GLUCAP in the last 168 hours. Lipid Profile: No results for input(s): CHOL, HDL, LDLCALC, TRIG, CHOLHDL, LDLDIRECT in the last 72 hours. Thyroid Function Tests: No results for input(s): TSH, T4TOTAL, FREET4, T3FREE, THYROIDAB in the last 72 hours. Anemia Panel: No results for input(s): VITAMINB12, FOLATE, FERRITIN, TIBC, IRON, RETICCTPCT in the last 72 hours. Urine analysis:    Component Value  Date/Time   COLORURINE STRAW (A) 03/30/2016 1407   APPEARANCEUR CLEAR 03/30/2016 1407   LABSPEC 1.006 03/30/2016 1407   PHURINE 7.0 03/30/2016 1407   GLUCOSEU NEGATIVE 03/30/2016 1407   GLUCOSEU NEGATIVE 06/03/2015 1344   HGBUR LARGE (A) 03/30/2016 1407   BILIRUBINUR NEGATIVE 03/30/2016 1407   BILIRUBINUR neg 05/17/2015 1134   KETONESUR NEGATIVE 03/30/2016 1407   PROTEINUR NEGATIVE 03/30/2016 1407   UROBILINOGEN 0.2 09/16/2015 1508   NITRITE NEGATIVE 03/30/2016 1407   LEUKOCYTESUR NEGATIVE 03/30/2016 1407     Ripudeep Rai M.D. [facility] 05/16/2017, 12:24 PM  Pager: [phone] Between 7am to 7pm - call Pager - [phone]  After 7pm go to [email or web address] - password [email or web address]  Call night coverage person covering after 7pm

## 2017-05-16 NOTE — Clinical Social Work Placement (Signed)
   CLINICAL SOCIAL WORK PLACEMENT  NOTE  Date:  05/16/2017  Patient Details  Name: Diane Frey MRN: [id number] Date of Birth: 1941/10/06  Clinical Social Work is seeking post-discharge placement for this patient at the [place] level of care (*CSW will initial, date and re-position this form in  chart as items are completed):  Yes   Patient/family provided with [facility] Work Department's list of facilities offering this level of care within the geographic area requested by the patient (or if unable, by the patient's family).  Yes   Patient/family informed of their freedom to choose among providers that offer the needed level of care, that participate in Medicare, Medicaid or managed care program needed by the patient, have an available bed and are willing to accept the patient.  Yes   Patient/family informed of [facility]'s ownership interest in [facility] and [facility], as well as of the fact that they are under no obligation to receive care at these facilities.  PASRR submitted to EDS on       PASRR number received on       Existing PASRR number confirmed on 05/16/17     FL2 transmitted to all facilities in geographic area requested by pt/family on       FL2 transmitted to all facilities within larger geographic area on       Patient informed that his/her managed care company has contracts with or will negotiate with certain facilities, including the following:            Patient/family informed of bed offers received.  Patient chooses bed at       Physician recommends and patient chooses bed at      Patient to be transferred to   on  .  Patient to be transferred to facility by       Patient family notified on   of transfer.  Name of family member notified:        PHYSICIAN Please sign FL2     Additional Comment:    _______________________________________________ Serafina Mitchell, [place] 05/16/2017, 1:17 PM

## 2017-05-16 NOTE — NC FL2 (Signed)
[place] LEVEL OF CARE SCREENING TOOL     IDENTIFICATION  Patient Name: Diane Frey Birthdate: 1941/08/29 Sex: female Admission Date (Current Location): 05/14/2017  [facility] and [place] Number:  Herbalist and Address:  The . [facility] [place] [address] [place], [place] [address]      Provider Number: [id number]  Attending Physician Name and Address:  Mendel Corning, MD  Relative Name and Phone Number:       Current Level of Care: Hospital Recommended Level of Care: [place] Prior Approval Number:    Date Approved/Denied:   PASRR Number: [id number]  Discharge Plan: SNF    Current Diagnoses: Patient Active Problem List   Diagnosis Date Noted  . Community acquired pneumonia 05/15/2017  . Acute on chronic diastolic congestive heart failure ([place])   . Lipoma of back 09/15/2016  . Chronic anemia 07/28/2016  . Uterine mass 06/26/2016  . Seizure ([place]) 03/30/2016  . Hypokalemia 03/30/2016  . Headache 01/06/2016  . Essential hypertension 05/06/2015  . Postmenopausal bleeding 07/05/2013  . ENDOCARDITIS 09/29/2010  . Dementia 01/20/2010  . Osteoarthrosis involving lower leg 05/01/2009  . OBESITY 01/16/2009  . History of stroke 02/10/2008  . Obstructive sleep apnea 05/27/2007  . Hyperlipidemia LDL goal <100 02/14/2007  . MYOCARDIAL INFARCTION, HX OF 02/14/2007  . Coronary atherosclerosis 02/14/2007    Orientation RESPIRATION BLADDER Height & Weight     Self, Time, Situation, Place  Normal Incontinent Weight: 204 lb 6.4 oz (92.7 kg) Height:  5\' 2"  (157.5 cm)  BEHAVIORAL SYMPTOMS/MOOD NEUROLOGICAL BOWEL NUTRITION STATUS      Incontinent Diet (See DC Summary)  AMBULATORY STATUS COMMUNICATION OF NEEDS Skin   Limited Assist Verbally Normal                       Personal Care Assistance Level of Assistance  Bathing, Dressing Bathing Assistance: Limited assistance   Dressing Assistance: Limited  assistance     Functional Limitations Info  Speech, Hearing, Sight Sight Info: Adequate Hearing Info: Adequate Speech Info: Adequate    SPECIAL CARE FACTORS FREQUENCY  PT (By licensed PT), OT (By licensed OT)     PT Frequency: 5x wk OT Frequency: 5x wk            Contractures Contractures Info: Not present    Additional Factors Info  Code Status, Allergies Code Status Info: Full Allergies Info: No Known Allergies           Current Medications (05/16/2017):  This is the current hospital active medication list Current Facility-Administered Medications  Medication Dose Route Frequency Provider Last Rate Last Dose  . acetaminophen (TYLENOL) tablet 650 mg  650 mg Oral Q6H PRN Edwin Dada, MD   650 mg at 05/15/17 1457   Or  . acetaminophen (TYLENOL) suppository 650 mg  650 mg Rectal Q6H PRN Danford, Suann Larry, MD      . aspirin chewable tablet 81 mg  81 mg Oral Daily Edwin Dada, MD   81 mg at 05/16/17 0929  . atorvastatin (LIPITOR) tablet 40 mg  40 mg Oral Daily Edwin Dada, MD   40 mg at 05/16/17 0929  . [START ON 05/17/2017] azithromycin (ZITHROMAX) tablet 500 mg  500 mg Oral Daily Ihor Austin, RPH      . carvedilol (COREG) tablet 25 mg  25 mg Oral BID WC Edwin Dada, MD   25 mg at 05/16/17 0929  .  cefTRIAXone (ROCEPHIN) 1 g in dextrose 5 % 50 mL IVPB  1 g Intravenous Q24H Edwin Dada, MD   Stopped at 05/16/17 1011  . donepezil (ARICEPT) tablet 10 mg  10 mg Oral QHS Edwin Dada, MD   10 mg at 05/15/17 2153  . enoxaparin (LOVENOX) injection 40 mg  40 mg Subcutaneous Q24H Danford, Suann Larry, MD   40 mg at 05/16/17 0930  . ferrous sulfate tablet 325 mg  325 mg Oral Q breakfast Edwin Dada, MD   325 mg at 05/16/17 0929  . furosemide (LASIX) tablet 40 mg  40 mg Oral Daily Edwin Dada, MD   40 mg at 05/16/17 0929  . levalbuterol (XOPENEX) nebulizer solution 0.63 mg  0.63 mg  Nebulization Q4H Rai, Ripudeep K, MD   0.63 mg at 05/16/17 1107   And  . ipratropium (ATROVENT) nebulizer solution 0.5 mg  0.5 mg Nebulization Q4H Rai, Ripudeep K, MD   0.5 mg at 05/16/17 1108  . latanoprost (XALATAN) 0.005 % ophthalmic solution 1 drop  1 drop Both Eyes QHS Danford, Suann Larry, MD   1 drop at 05/15/17 2155  . levalbuterol (XOPENEX) nebulizer solution 0.63 mg  0.63 mg Nebulization Q2H PRN Rai, Ripudeep K, MD      . levETIRAcetam (KEPPRA) tablet 500 mg  500 mg Oral BID Edwin Dada, MD   500 mg at 05/16/17 0930  . Melatonin TABS 3 mg  1 tablet Oral QHS Edwin Dada, MD   3 mg at 05/15/17 2153  . memantine (NAMENDA XR) 24 hr capsule 28 mg  28 mg Oral Daily Edwin Dada, MD   28 mg at 05/16/17 0929  . methylPREDNISolone sodium succinate (SOLU-MEDROL) 125 mg/2 mL injection 60 mg  60 mg Intravenous Q6H Rai, Ripudeep K, MD   60 mg at 05/16/17 0929  . ondansetron (ZOFRAN) tablet 4 mg  4 mg Oral Q6H PRN Danford, Suann Larry, MD       Or  . ondansetron (ZOFRAN) injection 4 mg  4 mg Intravenous Q6H PRN Danford, Suann Larry, MD      . sertraline (ZOLOFT) tablet 50 mg  50 mg Oral QHS Edwin Dada, MD   50 mg at 05/15/17 2153     Discharge Medications: Please see discharge summary for a list of discharge medications.  Relevant Imaging Results:  Relevant Lab Results:   Additional Information [phone]  Maryum Batterson B, LCSWA

## 2017-05-17 ENCOUNTER — Inpatient Hospital Stay (HOSPITAL_COMMUNITY): Payer: Medicare Other

## 2017-05-17 LAB — LEGIONELLA PNEUMOPHILA SEROGP 1 UR AG: L. pneumophila Serogp 1 Ur Ag: NEGATIVE

## 2017-05-17 LAB — BASIC METABOLIC PANEL WITH GFR
Anion gap: 11 (ref 5–15)
BUN: 25 mg/dL — ABNORMAL HIGH (ref 6–20)
CO2: 31 mmol/L (ref 22–32)
Calcium: 9.3 mg/dL (ref 8.9–10.3)
Chloride: 101 mmol/L (ref 101–111)
Creatinine, Ser: 1.02 mg/dL — ABNORMAL HIGH (ref 0.44–1.00)
GFR calc Af Amer: >60 mL/min
GFR calc non Af Amer: 53 mL/min — ABNORMAL LOW
Glucose, Bld: 135 mg/dL — ABNORMAL HIGH (ref 65–99)
Potassium: 4 mmol/L (ref 3.5–5.1)
Sodium: 143 mmol/L (ref 135–145)

## 2017-05-17 LAB — PROCALCITONIN: Procalcitonin: <0.1 ng/mL

## 2017-05-17 MED ORDER — PREDNISONE 10 MG PO TABS: ORAL_TABLET | ORAL | 0 refills | Status: DC

## 2017-05-17 MED ORDER — AZITHROMYCIN 500 MG PO TABS: 500.0000 mg | ORAL_TABLET | Freq: Every day | ORAL | 0 refills | Status: DC

## 2017-05-17 MED ORDER — IOPAMIDOL (ISOVUE-370) INJECTION 76%
INTRAVENOUS | Status: AC
  Administered 2017-05-17: 100 mL
  Filled 2017-05-17: qty 100

## 2017-05-17 MED ORDER — IPRATROPIUM BROMIDE 0.02 % IN SOLN
0.5000 mg | RESPIRATORY_TRACT | Status: DC
  Administered 2017-05-17 (×2): 0.5 mg via RESPIRATORY_TRACT
  Filled 2017-05-17 (×2): qty 2.5

## 2017-05-17 MED ORDER — METHYLPREDNISOLONE SODIUM SUCC 125 MG IJ SOLR
60.0000 mg | Freq: Three times a day (TID) | INTRAMUSCULAR | Status: DC
  Administered 2017-05-18 (×2): 60 mg via INTRAVENOUS
  Filled 2017-05-17 (×2): qty 2

## 2017-05-17 MED ORDER — LEVALBUTEROL HCL 0.63 MG/3ML IN NEBU: 0.6300 mg | INHALATION_SOLUTION | Freq: Two times a day (BID) | RESPIRATORY_TRACT | Status: DC

## 2017-05-17 MED ORDER — CEFPODOXIME PROXETIL 100 MG PO TABS
100.0000 mg | ORAL_TABLET | Freq: Two times a day (BID) | ORAL | 0 refills | Status: DC
Start: 2017-05-17 — End: 2017-05-20

## 2017-05-17 MED ORDER — LEVALBUTEROL HCL 0.63 MG/3ML IN NEBU
0.6300 mg | INHALATION_SOLUTION | Freq: Two times a day (BID) | RESPIRATORY_TRACT | Status: DC
  Administered 2017-05-17 – 2017-05-20 (×6): 0.63 mg via RESPIRATORY_TRACT
  Filled 2017-05-17 (×6): qty 3

## 2017-05-17 MED ORDER — LEVALBUTEROL HCL 0.63 MG/3ML IN NEBU
0.6300 mg | INHALATION_SOLUTION | Freq: Four times a day (QID) | RESPIRATORY_TRACT | Status: DC
  Administered 2017-05-17 (×2): 0.63 mg via RESPIRATORY_TRACT
  Filled 2017-05-17 (×2): qty 3

## 2017-05-17 MED ORDER — IPRATROPIUM BROMIDE 0.02 % IN SOLN: 0.5000 mg | RESPIRATORY_TRACT | Status: DC

## 2017-05-17 MED ORDER — IPRATROPIUM BROMIDE 0.02 % IN SOLN
0.5000 mg | Freq: Two times a day (BID) | RESPIRATORY_TRACT | Status: DC
  Administered 2017-05-17 – 2017-05-20 (×6): 0.5 mg via RESPIRATORY_TRACT
  Filled 2017-05-17 (×6): qty 2.5

## 2017-05-17 NOTE — Clinical Social Work Note (Signed)
Clinical Social Work Assessment  Patient Details  Name: Diane Frey MRN: [id number] Date of Birth: 1941/05/04  Date of referral:  05/17/17               Reason for consult:  Discharge Planning                Permission sought to share information with:  Facility Sport and exercise psychologist, Family Supports Permission granted to share information::  Yes, Verbal Permission Granted  Name::     Publishing rights manager::  [employer]  Relationship::  Daughter  Contact Information:     Housing/Transportation Living arrangements for the past 2 months:  [place] of Information:  Adult Children Patient Interpreter Needed:  None Criminal Activity/Legal Involvement Pertinent to Current Situation/Hospitalization:  No - Comment as needed Significant Relationships:  Adult Children Lives with:  Facility Resident Do you feel safe going back to the place where you live?  Yes Need for family participation in patient care:  Yes (Comment)  Care giving concerns:  CSW received consult regarding discharge planning. CSW spoke with patient's daughter. Patient came to the hospital from [facility] and will return at discharge. CSW to continue to follow and assist with discharge planning needs.  Social Worker assessment / plan:  CSW spoke with patient's daughter concerning return to SNF at discharge.  Employment status:  Retired Forensic scientist:  Medicare PT Recommendations:  Not assessed at this time Information / Referral to community resources:  [place]  Patient/Family's Response to care:  Patient's daughter reports agreement with discharge plan and requests PTAR.   Patient/Family's Understanding of and Emotional Response to Diagnosis, Current Treatment, and Prognosis:  Patient/family is realistic regarding therapy needs and expressed being hopeful for return to SNF placement. Patient expressed understanding of CSW role and discharge process as well as her medical  condition. No questions/concerns about plan or treatment.    Emotional Assessment Appearance:  Appears stated age Attitude/Demeanor/Rapport:  Unable to Assess Affect (typically observed):  Unable to Assess Orientation:  Oriented to Self, Oriented to Place, Oriented to Situation Alcohol / Substance use:  Not Applicable Psych involvement (Current and /or in the community):  No (Comment)  Discharge Needs  Concerns to be addressed:  Care Coordination Readmission within the last 30 days:  No Current discharge risk:  None Barriers to Discharge:  No Barriers Identified   Benard Halsted, [place] 05/17/2017, 4:22 PM

## 2017-05-17 NOTE — Evaluation (Signed)
Physical Therapy Evaluation Patient Details Name: Diane Frey MRN: [id number] DOB: [birth year] Today's Date: 05/17/2017   History of Present Illness  Pt is a 76 y/o female admitted secondary to CAP. Chest X ray showed R effusion and bibasilar opacities. PMH includes alzhiemer's dementia, seizures, HTN, CAD, MI, and obesity.   Clinical Impression  Pt admitted secondary to problem above with deficits below. Pt unable to provide any information about PLOF, and no caregiver present. RN reports pt came from [place]. Upon evaluation, pt limited by decreased cognition secondary to alzheimer's, weakness, decreased balance, and slowed processing. Pt requiring max A +2 for bed mobility and sit<>stand transfer. Recommending return to SNF at d/c. Will follow up with family/caregiver as they are available for information about PLOF. Will continue to follow to progress mobility.     Follow Up Recommendations SNF    Equipment Recommendations  None recommended by PT    Recommendations for Other Services       Precautions / Restrictions Precautions Precautions: Fall Precaution Comments: Pt blind  Restrictions Weight Bearing Restrictions: No      Mobility  Bed Mobility Overal bed mobility: Needs Assistance Bed Mobility: Supine to Sit;Sit to Supine     Supine to sit: Max assist;+2 for physical assistance Sit to supine: Max assist;+2 for physical assistance   General bed mobility comments: Max A +2 for trunk elevation and LE management. Pt assisted some, but required max cues for attention to task. Max A +2 to scoot to EOB.   Transfers Overall transfer level: Needs assistance Equipment used: 2 person hand held assist Transfers: Sit to/from Stand Sit to Stand: Max assist;+2 physical assistance         General transfer comment: MAx A +2 for initiation of standing, however, pt able to assist with power up to full stand. Only able to maintain standing for ~25 seconds.    Ambulation/Gait                Stairs            Wheelchair Mobility    Modified Rankin (Stroke Patients Only)       Balance Overall balance assessment: Needs assistance Sitting-balance support: Bilateral upper extremity supported;Feet supported Sitting balance-Leahy Scale: Poor Sitting balance - Comments: Demonstrated L Lateral lean and posterior lean in sitting requiring min to mod A to maintain sitting balance. Performed seated exercise with mod A to maintain trunk stability  Postural control: Left lateral lean;Posterior lean Standing balance support: Bilateral upper extremity supported Standing balance-Leahy Scale: Poor Standing balance comment: Max A +2 for standing                             Pertinent Vitals/Pain Pain Assessment: No/denies pain    Home Living Family/patient expects to be discharged to:: Skilled nursing facility                 Additional Comments: Per RN, d/c back to SNF.     Prior Function Level of Independence:  (Unsure)         Comments: Pt reports she does not know PLOF. Was at [facility], but no caregiver present to determine PLOF.      Hand Dominance   Dominant Hand: Right    Extremity/Trunk Assessment   Upper Extremity Assessment Upper Extremity Assessment: Generalized weakness    Lower Extremity Assessment Lower Extremity Assessment: Generalized weakness (Grossly 3/5 throughout )    Cervical /  Trunk Assessment Cervical / Trunk Assessment: Kyphotic  Communication   Communication: No difficulties  Cognition Arousal/Alertness: Awake/alert Behavior During Therapy: WFL for tasks assessed/performed Overall Cognitive Status: History of cognitive impairments - at baseline                                 General Comments: History of alzheimer's dementia.        General Comments General comments (skin integrity, edema, etc.): RN requesting to check oxygen sats. Pt oxygen sats at  88-89% on RA. Called RN and RN requested to put 2L of oxygen on. Sats from 91%-93% on 2L.     Exercises General Exercises - Lower Extremity Ankle Circles/Pumps: AROM;Both;10 reps;Seated Long Arc Quad: AROM;Both;10 reps;Seated   Assessment/Plan    PT Assessment Patient needs continued PT services  PT Problem List Decreased strength;Decreased balance;Decreased mobility;Decreased cognition;Decreased knowledge of use of DME;Decreased safety awareness;Decreased knowledge of precautions       PT Treatment Interventions DME instruction;Gait training;Functional mobility training;Therapeutic exercise;Therapeutic activities;Balance training;Neuromuscular re-education;Patient/family education    PT Goals (Current goals can be found in the Care Plan section)  Acute Rehab PT Goals Patient Stated Goal: unable to state PT Goal Formulation: Patient unable to participate in goal setting Time For Goal Achievement: 05/31/17 Potential to Achieve Goals: Fair    Frequency Min 2X/week   Barriers to discharge        Co-evaluation               AM-PAC PT "6 Clicks" Daily Activity  Outcome Measure Difficulty turning over in bed (including adjusting bedclothes, sheets and blankets)?: Total Difficulty moving from lying on back to sitting on the side of the bed? : Total Difficulty sitting down on and standing up from a chair with arms (e.g., wheelchair, bedside commode, etc,.)?: Total Help needed moving to and from a bed to chair (including a wheelchair)?: Total Help needed walking in hospital room?: Total Help needed climbing 3-5 steps with a railing? : Total 6 Click Score: 6    End of Session Equipment Utilized During Treatment: Gait belt;Oxygen Activity Tolerance: Patient tolerated treatment well Patient left: in bed;with call bell/phone within reach;with bed alarm set (Respiratory therapist in room ) Nurse Communication: Mobility status;Other (comment) (oxygen sats) PT Visit Diagnosis:  Other abnormalities of gait and mobility (R26.89)    Time: 9485-4627 PT Time Calculation (min) (ACUTE ONLY): 20 min   Charges:   PT Evaluation $PT Eval Moderate Complexity: 1 Procedure PT Treatments $Therapeutic Activity: 8-22 mins   PT G Codes:        Nicky Pugh, PT, DPT  Acute Rehabilitation Services  Pager: [phone]   [place] Melia 05/17/2017, 12:50 PM

## 2017-05-17 NOTE — Progress Notes (Addendum)
[facility]                                                                              Patient Demographics  Diane Frey, is a 76 y.o. female, DOB - October 31, 1941, DQQ:[id number]  Admit date - 05/14/2017   Admitting Physician Edwin Dada, MD  Outpatient Primary MD for the patient is Hendricks Limes, MD  Outpatient specialists:   LOS - 2  days   Medical records reviewed and are as summarized below:    Chief Complaint  Patient presents with  . 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 Per history, patient was wheezing and gurgling, coughing this week was noticed to be hypoxic at the facility. Chest x-ray showed bibasilar pneumonia.   Assessment & Plan    Principal Problem:   Community acquired pneumonia With acute hypoxic respiratory failure, pleural effusions  - Continue IV Zithromax, ceftriaxone. - Urinary strep antigen negative - Pro calcitonin less than 0.1, lactic acid 1.5 BNP 26 - Chest x-ray showed left greater than right pleural effusions with dense bibasilar atelectasis or pneumonia - This morning on examination, patient noted to be on 5 L O2 via nasal cannula, lungs clear, no wheezing per RN, was desating overnight to 70s, ordered CT angiographic the chest to rule out PE Addendum 5:55pm  CTA chest showed extensive right axillary adenopathy with suspected right breast mass, appearance concerning for metastatic breast cancer, large b/l pleural effusions.  - tried to discuss with patient but she has dementia and only oriented to self at this time - called patient's daughter, Lenna Sciara [facility]), updated all the above in detail, she was concerned about pleural effusions causing hypoxia. She reported no prior history of Breast Ca in the patient and couldn't make any decisions if she would like  to pursue any intervention about it. She understands that her mother has dementia, is legally blind and bed bound, she is open to talk to Palliative medicine.   - Palliative medicine consult placed for goals of care  - US guided thoracentesis in am for b/l pleural effusions Left > right, with studies including cytology.    Active Problems: Acute bronchitis - Wheezing improving today. - continue scheduled Xopenex and Atrovent, taper IV Solu-Medrol to 60 mg q 8 hrs, will transition to oral prednisone in a.m.  - Placed on as needed Xopenex and flutter valve  Suspected CHF:  - Has hx of CAD, on Lasix outpatient. No peripheral edema, BNP normal  -Continue furosemide - Currently euvolemic   Hypertension:  -Continue beta-blocker -Continue statin, aspirin   Dementia:  -Continue sertraline, Namenda, Aricept  Chronic Anemia:  From vaginal bleeding.  Currently not anemic. -H&H is stable, continue iron  Seizures:  In setting of old AVM, treated at OSH.  No reported seizure activity recently. -Continue Keppra   Hypokalemia - Resolved   Code Status: full  DVT Prophylaxis:  Lovenox Family Communication: Discussed in detail with the patient, all imaging results, lab results explained to  the patient's daughter    Disposition Plan: pending thoracentesis, palliative GOC   Time Spent in minutes   35 minutes  Procedures:  None  Consultants:   Palliative med   Antimicrobials:   Zithromax 6/9  Rocephin 6/9   Medications  Scheduled Meds: . aspirin  81 mg Oral Daily  . atorvastatin  40 mg Oral Daily  . azithromycin  500 mg Oral Daily  . carvedilol  25 mg Oral BID WC  . donepezil  10 mg Oral QHS  . enoxaparin (LOVENOX) injection  40 mg Subcutaneous Q24H  . ferrous sulfate  325 mg Oral Q breakfast  . furosemide  40 mg Oral Daily  . ipratropium  0.5 mg Nebulization BID   And  . levalbuterol  0.63 mg Nebulization BID  . latanoprost  1 drop Both Eyes QHS  .  levETIRAcetam  500 mg Oral BID  . Melatonin  1 tablet Oral QHS  . memantine  28 mg Oral Daily  . methylPREDNISolone (SOLU-MEDROL) injection  60 mg Intravenous Q6H  . sertraline  50 mg Oral QHS   Continuous Infusions: . cefTRIAXone (ROCEPHIN)  IV Stopped (05/17/17 1005)   PRN Meds:.acetaminophen **OR** acetaminophen, levalbuterol, ondansetron **OR** ondansetron (ZOFRAN) IV   Antibiotics   Anti-infectives    Start     Dose/Rate Route Frequency Ordered Stop   05/18/17 0000  azithromycin (ZITHROMAX) 500 MG tablet     500 mg Oral Daily 05/17/17 1051     05/17/17 1000  azithromycin (ZITHROMAX) tablet 500 mg     500 mg Oral Daily 05/16/17 1143     05/17/17 0000  cefpodoxime (VANTIN) 100 MG tablet     100 mg Oral 2 times daily 05/17/17 1051     05/15/17 1000  cefTRIAXone (ROCEPHIN) 1 g in dextrose 5 % 50 mL IVPB     1 g 100 mL/hr over 30 Minutes Intravenous Every 24 hours 05/15/17 0259 05/22/17 0959   05/15/17 0315  azithromycin (ZITHROMAX) 500 mg in dextrose 5 % 250 mL IVPB  Status:  Discontinued     500 mg 250 mL/hr over 60 Minutes Intravenous Daily 05/15/17 0259 05/16/17 1143   05/14/17 2330  vancomycin (VANCOCIN) IVPB 1000 mg/200 mL premix     1,000 mg 200 mL/hr over 60 Minutes Intravenous  Once 05/14/17 2315 05/15/17 0151   05/14/17 2330  ceFEPIme (MAXIPIME) 2 g in dextrose 5 % 50 mL IVPB     2 g 100 mL/hr over 30 Minutes Intravenous  Once 05/14/17 2315 05/15/17 0042        Subjective:   Basil Blakesley was seen and examined today. Breathing a lot better today, no wheezing this morning. However overnight, per RN, patient was de-satting and was placed on 6 L O2 via [place]. No chest pain, fevers or chills. No abdominal pain, nausea or vomiting. No coughing.   Objective:   Vitals:   05/17/17 1057 05/17/17 1118 05/17/17 1149 05/17/17 1235  BP:      Pulse: 84 82  64  Resp:      Temp:      TempSrc:      SpO2: 94% 92% 93% (!) 85%  Weight:      Height:        Intake/Output  Summary (Last 24 hours) at 05/17/17 1703 Last data filed at 05/17/17 1237  Gross per 24 hour  Intake              240 ml  Output  0 ml  Net              240 ml     Wt Readings from Last 3 Encounters:  05/16/17 92.7 kg (204 lb 6.4 oz)  05/05/17 91.4 kg (201 lb 9.6 oz)  03/24/17 93.1 kg (205 lb 3.2 oz)     Exam  General: Alert and oriented x Self, NAD, Legally blind  Eyes: PERRLA, EOMI  HEENT: normocephalic, atraumatic  Cardiovascular: S1 S2 clear, no MRG, RRR   Respiratory: Decreased breath sounds at the bases otherwise fairly clear to auscultation bilaterally  Gastrointestinal: Soft, nontender, nondistended, normal bowel sounds  Ext: no pedal edema bilaterally  Neuro: no focal neurological deficits  Musculoskeletal: No cyanosis or clubbing  Skin: No rashes  Psych: alert and oriented x 2, NAD   Data Reviewed:  I have personally reviewed following labs and imaging studies  Micro Results Recent Results (from the past 240 hour(s))  Blood culture (routine x 2)     Status: None (Preliminary result)   Collection Time: 05/14/17 11:00 PM  Result Value Ref Range Status   Specimen Description BLOOD RIGHT ARM  Final   Special Requests IN PEDIATRIC BOTTLE Blood Culture adequate volume  Final   Culture NO GROWTH 2 DAYS  Final   Report Status PENDING  Incomplete  Blood culture (routine x 2)     Status: None (Preliminary result)   Collection Time: 05/14/17 11:03 PM  Result Value Ref Range Status   Specimen Description BLOOD LEFT ANTECUBITAL  Final   Special Requests   Final    BOTTLES DRAWN AEROBIC AND ANAEROBIC Blood Culture adequate volume   Culture NO GROWTH 2 DAYS  Final   Report Status PENDING  Incomplete  MRSA PCR Screening     Status: None   Collection Time: 05/15/17  5:16 AM  Result Value Ref Range Status   MRSA by PCR NEGATIVE NEGATIVE Final    Comment:        The GeneXpert MRSA Assay (FDA approved for NASAL specimens only), is one component  of a comprehensive MRSA colonization surveillance program. It is not intended to diagnose MRSA infection nor to guide or monitor treatment for MRSA infections.     Radiology Reports Dg Chest 2 View  Result Date: 05/14/2017 CLINICAL DATA:  Pneumonia, hypertension EXAM: CHEST  2 VIEW COMPARISON:  09/16/2015 FINDINGS: Bilateral pleural effusions, left greater than right. Bilateral lower lung atelectasis or pneumonia. There is cardiomegaly with central congestion and moderate perihilar edema. No pneumothorax. IMPRESSION: 1. Cardiomegaly with vascular congestion and moderate perihilar edema 2. Left greater than right pleural effusions with dense bibasilar atelectasis or pneumonia. Electronically Signed   By: Donavan Foil M.D.   On: 05/14/2017 22:38    Lab Data:  CBC:  Recent Labs Lab 05/14/17 2222 05/16/17 0519  WBC 7.0 6.5  NEUTROABS 4.7  --   HGB 13.2 12.2  HCT 42.2 39.6  MCV 92.7 92.5  PLT 190 413   Basic Metabolic Panel:  Recent Labs Lab 05/15/17 0130 05/16/17 0519 05/17/17 0507  NA 142 144 143  K 3.1* 3.7 4.0  CL 106 103 101  CO2 27 32 31  GLUCOSE 217* 87 135*  BUN 23* 24* 25*  CREATININE 0.86 1.09* 1.02*  CALCIUM 7.9* 9.1 9.3   GFR: Estimated Creatinine Clearance: 51.3 mL/min (A) (by C-G formula based on SCr of 1.02 mg/dL (H)). Liver Function Tests:  Recent Labs Lab 05/15/17 0130  AST 24  ALT 12*  ALKPHOS  71  BILITOT 1.0  PROT 5.5*  ALBUMIN 2.6*   No results for input(s): LIPASE, AMYLASE in the last 168 hours. No results for input(s): AMMONIA in the last 168 hours. Coagulation Profile: No results for input(s): INR, PROTIME in the last 168 hours. Cardiac Enzymes:  Recent Labs Lab 05/15/17 0130  TROPONINI <0.03   BNP (last 3 results) No results for input(s): PROBNP in the last 8760 hours. HbA1C: No results for input(s): HGBA1C in the last 72 hours. CBG: No results for input(s): GLUCAP in the last 168 hours. Lipid Profile: No results for  input(s): CHOL, HDL, LDLCALC, TRIG, CHOLHDL, LDLDIRECT in the last 72 hours. Thyroid Function Tests: No results for input(s): TSH, T4TOTAL, FREET4, T3FREE, THYROIDAB in the last 72 hours. Anemia Panel: No results for input(s): VITAMINB12, FOLATE, FERRITIN, TIBC, IRON, RETICCTPCT in the last 72 hours. Urine analysis:    Component Value Date/Time   COLORURINE STRAW (A) 03/30/2016 1407   APPEARANCEUR CLEAR 03/30/2016 1407   LABSPEC 1.006 03/30/2016 1407   PHURINE 7.0 03/30/2016 1407   GLUCOSEU NEGATIVE 03/30/2016 1407   GLUCOSEU NEGATIVE 06/03/2015 1344   HGBUR LARGE (A) 03/30/2016 1407   BILIRUBINUR NEGATIVE 03/30/2016 1407   BILIRUBINUR neg 05/17/2015 1134   KETONESUR NEGATIVE 03/30/2016 1407   PROTEINUR NEGATIVE 03/30/2016 1407   UROBILINOGEN 0.2 09/16/2015 1508   NITRITE NEGATIVE 03/30/2016 1407   LEUKOCYTESUR NEGATIVE 03/30/2016 1407     Margert Edsall M.D. [facility] 05/17/2017, 5:03 PM  Pager: [phone] Between 7am to 7pm - call Pager - [phone]  After 7pm go to [email or web address] - password [email or web address]  Call night coverage person covering after 7pm

## 2017-05-18 ENCOUNTER — Inpatient Hospital Stay (HOSPITAL_COMMUNITY): Payer: Medicare Other

## 2017-05-18 ENCOUNTER — Encounter (HOSPITAL_COMMUNITY): Payer: Self-pay | Admitting: Radiology

## 2017-05-18 HISTORY — PX: IR THORACENTESIS ASP PLEURAL SPACE W/IMG GUIDE: IMG5380

## 2017-05-18 LAB — BODY FLUID CELL COUNT WITH DIFFERENTIAL
EOS FL: 0 %
Lymphs, Fluid: 62 %
Monocyte-Macrophage-Serous Fluid: 34 % — ABNORMAL LOW (ref 50–90)
Neutrophil Count, Fluid: 4 % (ref 0–25)
Total Nucleated Cell Count, Fluid: 540 cu mm (ref 0–1000)

## 2017-05-18 LAB — GLUCOSE, PLEURAL OR PERITONEAL FLUID: Glucose, Fluid: 159 mg/dL

## 2017-05-18 LAB — PROTEIN, PLEURAL OR PERITONEAL FLUID: TOTAL PROTEIN, FLUID: 3.8 g/dL

## 2017-05-18 LAB — LACTATE DEHYDROGENASE, PLEURAL OR PERITONEAL FLUID: LD FL: 97 U/L — AB (ref 3–23)

## 2017-05-18 MED ORDER — METHYLPREDNISOLONE SODIUM SUCC 125 MG IJ SOLR
60.0000 mg | Freq: Two times a day (BID) | INTRAMUSCULAR | Status: DC
  Administered 2017-05-18 – 2017-05-20 (×4): 60 mg via INTRAVENOUS
  Filled 2017-05-18 (×4): qty 2

## 2017-05-18 MED ORDER — LIDOCAINE HCL 1 % IJ SOLN
INTRAMUSCULAR | Status: DC | PRN
  Administered 2017-05-18: 10 mL

## 2017-05-18 MED ORDER — LIDOCAINE HCL 1 % IJ SOLN
INTRAMUSCULAR | Status: AC
  Filled 2017-05-18: qty 20

## 2017-05-18 NOTE — Progress Notes (Signed)
[facility]                                                                              Patient Demographics  Diane Frey, is a 76 y.o. female, DOB - 1941-05-21, WUJ:[id number]  Admit date - 05/14/2017   Admitting Physician Edwin Dada, MD  Outpatient Primary MD for the patient is Hendricks Limes, MD  Outpatient specialists:   LOS - 3  days   Medical records reviewed and are as summarized below:    Chief Complaint  Patient presents with  . 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 Per history, patient was wheezing and gurgling, coughing this week was noticed to be hypoxic at the facility. Chest x-ray showed bibasilar pneumonia.   Assessment & Plan    Principal Problem:   Community acquired pneumonia With acute hypoxic respiratory failure, pleural effusions  - Continue IV Zithromax, ceftriaxone. - Urinary strep antigen negative - CTA chest showed extensive right axillary adenopathy with suspected right breast mass, appearance concerning for metastatic breast cancer, large b/l pleural effusions.  - Ultrasound-guided thoracentesis pending today, if possible bilaterally otherwise left-sided, with studies including cytology - Discussed with patient's daughter, Lenna Sciara (HPOA), updated all the above in detail, reported no prior history of Breast Ca in the patient and couldn't make any decisions if she would like to pursue any intervention about it. She understands that her mother has dementia, is legally blind and bed bound, she is open to talk to Palliative medicine. Palliative medicine goals of care meeting scheduled for 6/13   Active Problems: Acute bronchitis - Wheezing improving today. - continue scheduled Xopenex and Atrovent, taper IV Solu-Medrol to 60 mg q 12 hrs - cont as needed Xopenex  and flutter valve  Suspected CHF:  - Has hx of CAD, on Lasix outpatient. No peripheral edema, BNP normal  -Continue furosemide   Hypertension:  -Continue beta-blocker -Continue statin, aspirin   Dementia:  -Continue sertraline, Namenda, Aricept  Chronic Anemia:  - Currently not anemic. -H&H is stable, continue iron  Seizures:  In setting of old AVM, treated at OSH.  No reported seizure activity recently. -Continue Keppra   Hypokalemia - Resolved   Code Status: full  DVT Prophylaxis:  Lovenox Family Communication: Discussed in detail with the patient, all imaging results, lab results explained to the patient's daughter on 6/11   Disposition Plan: pending thoracentesis, palliative GOC   Time Spent in minutes   25 minutes  Procedures:  None  Consultants:   Palliative med   Antimicrobials:   Zithromax 6/9  Rocephin 6/9   Medications  Scheduled Meds: . aspirin  81 mg Oral Daily  . atorvastatin  40 mg Oral Daily  . azithromycin  500 mg Oral Daily  . carvedilol  25 mg Oral BID WC  . donepezil  10 mg Oral QHS  . enoxaparin (LOVENOX) injection  40 mg Subcutaneous Q24H  . ferrous sulfate  325 mg Oral Q breakfast  . furosemide  40 mg Oral Daily  . ipratropium  0.5 mg Nebulization BID   And  . levalbuterol  0.63 mg Nebulization BID  . latanoprost  1 drop Both Eyes QHS  . levETIRAcetam  500 mg Oral BID  . lidocaine      . Melatonin  1 tablet Oral QHS  . memantine  28 mg Oral Daily  . methylPREDNISolone (SOLU-MEDROL) injection  60 mg Intravenous Q8H  . sertraline  50 mg Oral QHS   Continuous Infusions: . cefTRIAXone (ROCEPHIN)  IV Stopped (05/18/17 1024)   PRN Meds:.acetaminophen **OR** acetaminophen, levalbuterol, ondansetron **OR** ondansetron (ZOFRAN) IV   Antibiotics   Anti-infectives    Start     Dose/Rate Route Frequency Ordered Stop   05/18/17 0000  azithromycin (ZITHROMAX) 500 MG tablet     500 mg Oral Daily 05/17/17 1051     05/17/17  1000  azithromycin (ZITHROMAX) tablet 500 mg     500 mg Oral Daily 05/16/17 1143     05/17/17 0000  cefpodoxime (VANTIN) 100 MG tablet     100 mg Oral 2 times daily 05/17/17 1051     05/15/17 1000  cefTRIAXone (ROCEPHIN) 1 g in dextrose 5 % 50 mL IVPB     1 g 100 mL/hr over 30 Minutes Intravenous Every 24 hours 05/15/17 0259 05/22/17 0959   05/15/17 0315  azithromycin (ZITHROMAX) 500 mg in dextrose 5 % 250 mL IVPB  Status:  Discontinued     500 mg 250 mL/hr over 60 Minutes Intravenous Daily 05/15/17 0259 05/16/17 1143   05/14/17 2330  vancomycin (VANCOCIN) IVPB 1000 mg/200 mL premix     1,000 mg 200 mL/hr over 60 Minutes Intravenous  Once 05/14/17 2315 05/15/17 0151   05/14/17 2330  ceFEPIme (MAXIPIME) 2 g in dextrose 5 % 50 mL IVPB     2 g 100 mL/hr over 30 Minutes Intravenous  Once 05/14/17 2315 05/15/17 0042        Subjective:   Milaya Hora was seen and examined today. Breathing better, wheezing improving, no fevers or chills. Denies any specific complaints today. O2 sats 96% on 2L . No chest pain, fevers or chills. No abdominal pain, nausea or vomiting.   Objective:   Vitals:   05/17/17 2306 05/18/17 0537 05/18/17 0858 05/18/17 1436  BP: (!) 148/77 (!) 157/71  138/66  Pulse: 72 65  93  Resp: 18 18  20   Temp: 97.7 F (36.5 C) 98.1 F (36.7 C)  97.6 F (36.4 C)  TempSrc: Oral   Oral  SpO2: 95% 96% (!) 89% 94%  Weight:      Height:        Intake/Output Summary (Last 24 hours) at 05/18/17 1525 Last data filed at 05/18/17 1435  Gross per 24 hour  Intake              680 ml  Output              450 ml  Net              230 ml     Wt Readings from Last 3 Encounters:  05/16/17 92.7 kg (204 lb 6.4 oz)  05/05/17 91.4 kg (201 lb 9.6 oz)  03/24/17 93.1 kg (205 lb 3.2 oz)     Exam  General: Alert and oriented x Self, NAD, Legally blind  Eyes: PERRLA, EOMI  HEENT: Normocephalic, atraumatic  Cardiovascular:S1 and S2 clear, RRR  Respiratory: Scattered  rhonchi bilaterally  Gastrointestinal: Soft, NT, ND,  normal bowel sounds  Ext: no pedal edema bilaterally  Neuro: no FND  Musculoskeletal: No cyanosis, clubbing  Skin: No rashes  Psych: alert and oriented x 2,self and place (hospital) NAD   Data Reviewed:  I have personally reviewed following labs and imaging studies  Micro Results Recent Results (from the past 240 hour(s))  Blood culture (routine x 2)     Status: None (Preliminary result)   Collection Time: 05/14/17 11:00 PM  Result Value Ref Range Status   Specimen Description BLOOD RIGHT ARM  Final   Special Requests IN PEDIATRIC BOTTLE Blood Culture adequate volume  Final   Culture NO GROWTH 3 DAYS  Final   Report Status PENDING  Incomplete  Blood culture (routine x 2)     Status: None (Preliminary result)   Collection Time: 05/14/17 11:03 PM  Result Value Ref Range Status   Specimen Description BLOOD LEFT ANTECUBITAL  Final   Special Requests   Final    BOTTLES DRAWN AEROBIC AND ANAEROBIC Blood Culture adequate volume   Culture NO GROWTH 3 DAYS  Final   Report Status PENDING  Incomplete  MRSA PCR Screening     Status: None   Collection Time: 05/15/17  5:16 AM  Result Value Ref Range Status   MRSA by PCR NEGATIVE NEGATIVE Final    Comment:        The GeneXpert MRSA Assay (FDA approved for NASAL specimens only), is one component of a comprehensive MRSA colonization surveillance program. It is not intended to diagnose MRSA infection nor to guide or monitor treatment for MRSA infections.     Radiology Reports Dg Chest 2 View  Result Date: 05/14/2017 CLINICAL DATA:  Pneumonia, hypertension EXAM: CHEST  2 VIEW COMPARISON:  09/16/2015 FINDINGS: Bilateral pleural effusions, left greater than right. Bilateral lower lung atelectasis or pneumonia. There is cardiomegaly with central congestion and moderate perihilar edema. No pneumothorax. IMPRESSION: 1. Cardiomegaly with vascular congestion and moderate perihilar edema  2. Left greater than right pleural effusions with dense bibasilar atelectasis or pneumonia. Electronically Signed   By: Donavan Foil M.D.   On: 05/14/2017 22:38   Ct Angio Chest Pe W Or Wo Contrast  Result Date: 05/17/2017 CLINICAL DATA:  Hypoxia and shortness of breath. EXAM: CT ANGIOGRAPHY CHEST WITH CONTRAST TECHNIQUE: Multidetector CT imaging of the chest was performed using the standard protocol during bolus administration of intravenous contrast. Multiplanar CT image reconstructions and MIPs were obtained to evaluate the vascular anatomy. CONTRAST:  100 cc Isovue 370 COMPARISON:  Chest radiograph 05/14/2017 FINDINGS: Cardiovascular: No filling defect is identified in the pulmonary arterial tree to suggest pulmonary embolus. Coronary, aortic arch, and branch vessel atherosclerotic vascular disease. Small pericardial effusion. Mediastinum/Nodes: There is right axillary adenopathy, with 1 right axillary mass measuring 3.5 by 4.2 cm on image 76/7. Skin thickening in the right breast with suspected subareolar mass, image 134/7. Edema in the right breast. Right supraclavicular node 1.4 cm in short axis on image 13/7. Right hilar and suprahilar adenopathy is confluent and extends into the mediastinum, surrounding the rib right mainstem bronchus and right central tracheobronchial tree. Because this is so confluent, it is difficult to measure, but adenopathy between the right mainstem bronchus and the IVC measures 1.5 cm in thickness, and a right hilar node is measures proximally 1.6 cm in thickness. Suspected indistinct subcarinal adenopathy. Lungs/Pleura: Solid pulmonary nodules are scattered in both lungs. Many of these are subpleural in location. An index right lower lobe nodule on image 77/6 measures  1.4 by 0.9 cm. Large bilateral pleural effusions are present, especially on the long the left. Passive atelectasis of most of the left lung. There is some mosaic attenuation in the lungs which may be due to  edema. Narrowing of the tracheobronchial tree is present centrally, and a component of bronchomalacia is not excluded. Some of the appearance may be due to wall thickening along the central bronchial tree bilaterally. There is some unusual low-density along the left central tracheobronchial tree, lower in density than the surrounding atelectatic lung, possibly from adenopathy or airspace filling process centrally. Upper Abdomen: Imaging extensor most but not all of the left hemidiaphragm. Visualized upper abdominal structures grossly unremarkable aside from atherosclerosis. Musculoskeletal: Considerable thoracic and lower cervical spondylosis. Review of the MIP images confirms the above findings. IMPRESSION: 1. Extensive right axillary adenopathy with suspected right breast mass, appearance concerning for metastatic breast cancer. 2. Large bilateral pleural effusions with innumerable scattered pulmonary nodules, primarily in subpleural locations, and with associated passive atelectasis. Extensive perihilar soft tissue density suggesting adenopathy bilaterally. Overall the tools and thoracic adenopathy are very likely due to metastatic malignancy, less likely to be due to entities such as sarcoidosis. The large pleural effusions may be contributing to the patient's hypoxia. 3. Either airway thickening or bronchomalacia is causing narrowing of the central bronchial tree bilaterally. Extrinsic narrowing from the surrounding adenopathy may also be contributory. 4. Mosaic attenuation in the lungs potentially from mild edema or air trapping. 5.  Aortic Atherosclerosis (ICD10-I70.0).  Coronary atherosclerosis. 6. Small pericardial effusion. These results will be called to the ordering clinician or representative by the Radiologist Assistant, and communication documented in the PACS or zVision Dashboard. Electronically Signed   By: Van Clines M.D.   On: 05/17/2017 17:29    Lab Data:  CBC:  Recent Labs Lab  05/14/17 2222 05/16/17 0519  WBC 7.0 6.5  NEUTROABS 4.7  --   HGB 13.2 12.2  HCT 42.2 39.6  MCV 92.7 92.5  PLT 190 465   Basic Metabolic Panel:  Recent Labs Lab 05/15/17 0130 05/16/17 0519 05/17/17 0507  NA 142 144 143  K 3.1* 3.7 4.0  CL 106 103 101  CO2 27 32 31  GLUCOSE 217* 87 135*  BUN 23* 24* 25*  CREATININE 0.86 1.09* 1.02*  CALCIUM 7.9* 9.1 9.3   GFR: Estimated Creatinine Clearance: 51.3 mL/min (A) (by C-G formula based on SCr of 1.02 mg/dL (H)). Liver Function Tests:  Recent Labs Lab 05/15/17 0130  AST 24  ALT 12*  ALKPHOS 71  BILITOT 1.0  PROT 5.5*  ALBUMIN 2.6*   No results for input(s): LIPASE, AMYLASE in the last 168 hours. No results for input(s): AMMONIA in the last 168 hours. Coagulation Profile: No results for input(s): INR, PROTIME in the last 168 hours. Cardiac Enzymes:  Recent Labs Lab 05/15/17 0130  TROPONINI <0.03   BNP (last 3 results) No results for input(s): PROBNP in the last 8760 hours. HbA1C: No results for input(s): HGBA1C in the last 72 hours. CBG: No results for input(s): GLUCAP in the last 168 hours. Lipid Profile: No results for input(s): CHOL, HDL, LDLCALC, TRIG, CHOLHDL, LDLDIRECT in the last 72 hours. Thyroid Function Tests: No results for input(s): TSH, T4TOTAL, FREET4, T3FREE, THYROIDAB in the last 72 hours. Anemia Panel: No results for input(s): VITAMINB12, FOLATE, FERRITIN, TIBC, IRON, RETICCTPCT in the last 72 hours. Urine analysis:    Component Value Date/Time   COLORURINE STRAW (A) 03/30/2016 1407   APPEARANCEUR CLEAR 03/30/2016  1407   LABSPEC 1.006 03/30/2016 1407   PHURINE 7.0 03/30/2016 1407   GLUCOSEU NEGATIVE 03/30/2016 1407   GLUCOSEU NEGATIVE 06/03/2015 1344   HGBUR LARGE (A) 03/30/2016 1407   BILIRUBINUR NEGATIVE 03/30/2016 1407   BILIRUBINUR neg 05/17/2015 [place] 03/30/2016 1407   PROTEINUR NEGATIVE 03/30/2016 1407   UROBILINOGEN 0.2 09/16/2015 1508   NITRITE NEGATIVE  03/30/2016 1407   LEUKOCYTESUR NEGATIVE 03/30/2016 1407     Xue Low M.D. [facility] 05/18/2017, 3:25 PM  Pager: [phone] Between 7am to 7pm - call Pager - [phone]  After 7pm go to [email or web address] - password [email or web address]  Call night coverage person covering after 7pm

## 2017-05-18 NOTE — Procedures (Signed)
   US guided Left thoracentesis 800 cc dark yellow fluid  Tolerated well Sent for labs per MD  CXR pending

## 2017-05-18 NOTE — Progress Notes (Signed)
No charge note.  PMT meeting scheduled for 6/13 at noon.  Imogene Burn, [place] [facility] Pager: [phone]

## 2017-05-19 DIAGNOSIS — Z515 Encounter for palliative care: Secondary | ICD-10-CM

## 2017-05-19 DIAGNOSIS — Z7189 Other specified counseling: Secondary | ICD-10-CM

## 2017-05-19 DIAGNOSIS — J90 Pleural effusion, not elsewhere classified: Secondary | ICD-10-CM

## 2017-05-19 DIAGNOSIS — F0280 Dementia in other diseases classified elsewhere without behavioral disturbance: Secondary | ICD-10-CM

## 2017-05-19 DIAGNOSIS — R0902 Hypoxemia: Secondary | ICD-10-CM

## 2017-05-19 DIAGNOSIS — Z66 Do not resuscitate: Secondary | ICD-10-CM

## 2017-05-19 LAB — GRAM STAIN

## 2017-05-19 LAB — BASIC METABOLIC PANEL
Anion gap: 13 (ref 5–15)
BUN: 34 mg/dL — AB (ref 6–20)
CHLORIDE: 101 mmol/L (ref 101–111)
CO2: 28 mmol/L (ref 22–32)
Calcium: 8.7 mg/dL — ABNORMAL LOW (ref 8.9–10.3)
Creatinine, Ser: 1.07 mg/dL — ABNORMAL HIGH (ref 0.44–1.00)
GFR calc non Af Amer: 50 mL/min — ABNORMAL LOW (ref >60)
GFR, EST AFRICAN AMERICAN: 58 mL/min — AB (ref >60)
Glucose, Bld: 109 mg/dL — ABNORMAL HIGH (ref 65–99)
POTASSIUM: 3.4 mmol/L — AB (ref 3.5–5.1)
Sodium: 142 mmol/L (ref 135–145)

## 2017-05-19 LAB — CBC
HCT: 40.7 % (ref 36.0–46.0)
Hemoglobin: 12.6 g/dL (ref 12.0–15.0)
MCH: 28.3 pg (ref 26.0–34.0)
MCHC: 31 g/dL (ref 30.0–36.0)
MCV: 91.3 fL (ref 78.0–100.0)
Platelets: 222 10*3/uL (ref 150–400)
RBC: 4.46 MIL/uL (ref 3.87–5.11)
RDW: 14.2 % (ref 11.5–15.5)
WBC: 6.5 10*3/uL (ref 4.0–10.5)

## 2017-05-19 LAB — PH, BODY FLUID: PH, BODY FLUID: 7.9

## 2017-05-19 LAB — PROCALCITONIN: Procalcitonin: <0.1 ng/mL

## 2017-05-19 MED ORDER — MORPHINE SULFATE (CONCENTRATE) 10 MG/0.5ML PO SOLN: 5.0000 mg | ORAL | Status: DC | PRN

## 2017-05-19 NOTE — Consult Note (Signed)
           [facility] CM Primary Care Navigator  05/19/2017  Diane Frey 11/09/1941 [id number]   Went to see patient at the bedside to identify possible discharge needs.  Patient has history of dementia, is bed bound and blind.  She was admitted from [facility] facility with hypoxia and pneumonia with CT-chest result that demonstrated extensive adenopathy and a right breast mass concerning for metastatic cancer. Family has opted not to work up or treat aggressively.  Palliative Care consult was made with recommendation for skilled nursing facility with Hospice.  Patient will return back to [facility] when ready. . No further health management needed at this time.   For questions, please contact:  Dannielle Huh, BSN, RN- [facility] Primary Care Navigator  Telephone: [phone] [place]

## 2017-05-19 NOTE — Consult Note (Signed)
Consultation Note Date: 05/19/2017   Patient Name: Diane Frey  DOB: July 01, 1941  MRN: [id number]  Age / Sex: 76 y.o., female  PCP: Hendricks Limes, MD Referring Physician: Domenic Polite, MD  Reason for Consultation: Establishing goals of care  HPI/Patient Profile: 76 y.o. female  with past medical history of dementia, OSA, CVA, seizures, CAD, vaginal bleeding.  She is bed bound and blind.  She was admitted on 05/14/2017 from SNF with hypoxia and pneumonia on chest xray. During her hospital stay she had an acute episode of dyspnea prompting the attending physician to order a CT angio chest which demonstrated extensive adenopathy and a right breast mass concerning for metastatic cancer.  Her dyspnea was caused by large bilateral pleural effusions.  Interventional radiology performed a left sided thoracentesis providing significant relief to Diane Frey.  Clinical Assessment and Goals of Care:  I have reviewed medical records including EPIC notes, labs and imaging, received report from the care team, assessed the patient and then met at the bedside along with her daughter Diane Frey / [place]), son Diane Frey, and daughter Diane Frey  to discuss diagnosis prognosis, GOC, EOL wishes, disposition and options.  I introduced Palliative Medicine as specialized medical care for people living with serious illness. It focuses on providing relief from the symptoms and stress of a serious illness. The goal is to improve quality of life for both the patient and the family.  As a group we discussed Diane Frey's overall health and the results of the CTA being likely metastatic cancer related to a right breast mass.  The family asked about surgery and chemo therapy.  I explained that surgery would be unlikely as this appears to be a metastatic process.  Any treatment offered would be palliative.   Diane Frey understood that her mother  is weak from previous CVA and heart disease.  She reported that in the past doctors had wanted to operate on her mother but felt she was too weak for surgery.  [name] currently has no complaints and feels comfortable.  We discussed the likelihood that chemotherapy would make her less comfortable and cause more time in the hospital / medical offices.  The family opted against biopsy, surgery and chemotherapy.   We discussed code status.  The family felt strongly that they would not want their mother to be uncomfortable.  They would not want her body to be put thru a code.  We discussed hospice services at SNF.  The family was in favor of the extra support and attention their mother would receive from hospice.  We also discussed palliative thoracentesis for when the fluid returns or possibly even a pleurx catheter for comfort if the fluid returns very quickly.   Diane Frey felt the extra nursing attention hospice could provide would be beneficial in determining when the fluid came back.    We also discussed comfort medications and low dose PRN morphine for dyspnea.  Questions and concerns were addressed.  Hard Choices booklet left for review. The family was encouraged to  call with questions or concerns.  PMT will continue to support holistically.    Primary Decision Maker:  HCPOA Diane Frey (dtr)    SUMMARY OF RECOMMENDATIONS    When medically optimized please return to [facility] with Hospice Services. Family would like for facility MD / RN and Hospice to carefully monitor for distressed breathing (the return of pleural effusion) - and provide for palliative thoracentesis when needed for comfort.  Code Status/Advance Care Planning:  DNR    Symptom Management:   Morphine concentrate sub lingual for shortness of breath    Family desires further thoracentesis if needed for comfort.   Additional Recommendations (Limitations, Scope, Preferences):  Minimize Medications, No  Chemotherapy and No Surgical Procedures  Palliative Prophylaxis:   Frequent Pain Assessment and Monitor for shortness of breath or increased work of breathing.  Psycho-social/Spiritual:    Desire for further Chaplaincy support: Yes - [employer]  Prognosis:   < 6 months - bed bound status, h/o CVA and heart diease.  Now with metastatic cancer the family has opted not to work up or treat aggressive.  Discharge Planning: [place] with Hospice      Primary Diagnoses: Present on Admission: . Chronic anemia . Coronary atherosclerosis . Dementia . Essential hypertension . Community acquired pneumonia   I have reviewed the medical record, interviewed the patient and family, and examined the patient. The following aspects are pertinent.  Past Medical History:  Diagnosis Date  . Alzheimer disease   . 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 CAD (coronary artery disease)   . CORONARY ARTERY DISEASE 02/14/2007  . CVA 02/10/2008  . DEMENTIA 01/20/2010  . Edema   . ENDOCARDITIS 09/29/2010  . GERD (gastroesophageal reflux disease)   . Glaucoma   . Gout   . HYPERLIPIDEMIA 02/14/2007  . HYPERTENSION 02/14/2007  . Lack of coordination   . Morbid obesity ([place])   . Myocardial infarct ([place])   . MYOCARDIAL INFARCTION, HX OF 02/14/2007  . OBESITY 01/16/2009  . OBSTRUCTIVE SLEEP APNEA 05/27/2007  . 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 Postmenopausal bleeding   . Unspecified urinary incontinence    Social History   Social History  . Marital status: Widowed    Spouse name: N/A  . Number of children: N/A  . Years of education: N/A   Social History Main Topics  . Smoking status: Never Smoker  . Smokeless tobacco: Never Used  . Alcohol use No  . Drug use: No  . Sexual activity: No   Other Topics Concern  . None   Social History Narrative  . None   Family History  Problem Relation Age of Onset  . Heart failure Mother        Deceased  . Cancer Mother   .  Heart disease Father        Deceased  . Kidney disease Brother        Deceased  . Stroke Sister   . Hypertension Brother   . Hypertension Sister   . Heart disease Sister        Deceased  . Hypertension Sister        x2  . Hypertension Son        x2  . Glaucoma Son    Scheduled Meds: . aspirin  81 mg Oral Daily  . atorvastatin  40 mg Oral Daily  . azithromycin  500 mg Oral Daily  . carvedilol  25 mg Oral BID WC  . donepezil  10  mg Oral QHS  . enoxaparin (LOVENOX) injection  40 mg Subcutaneous Q24H  . ferrous sulfate  325 mg Oral Q breakfast  . furosemide  40 mg Oral Daily  . ipratropium  0.5 mg Nebulization BID   And  . levalbuterol  0.63 mg Nebulization BID  . latanoprost  1 drop Both Eyes QHS  . levETIRAcetam  500 mg Oral BID  . Melatonin  1 tablet Oral QHS  . memantine  28 mg Oral Daily  . methylPREDNISolone (SOLU-MEDROL) injection  60 mg Intravenous Q12H  . sertraline  50 mg Oral QHS   Continuous Infusions: . cefTRIAXone (ROCEPHIN)  IV Stopped (05/19/17 1000)   PRN Meds:.acetaminophen **OR** acetaminophen, levalbuterol, lidocaine, morphine CONCENTRATE, ondansetron **OR** ondansetron (ZOFRAN) IV No Known Allergies Review of Systems patient is pleasantly demented.  Denies complaints.  Physical Exam  Well developed pleasantly demented female, Awake, orientated, cooperative. Blind CV RRR Resp not distress Abdomen soft Ext 1+ edema  Vital Signs: BP (!) 172/84 (BP Location: Right Arm) Comment: nurse notified  Pulse 64   Temp 97.5 F (36.4 C) (Oral)   Resp 18   Ht '5\' 2"'  (1.575 m)   Wt 92.7 kg (204 lb 6.4 oz)   SpO2 93%   BMI 37.39 kg/m  Pain Assessment: No/denies pain   Pain Score: 0-No pain   SpO2: SpO2: 93 % O2 Device:SpO2: 93 % O2 Flow Rate: .O2 Flow Rate (L/min): 3 L/min  IO: Intake/output summary:   Intake/Output Summary (Last 24 hours) at 05/19/17 1326 Last data filed at 05/19/17 0900  Gross per 24 hour  Intake              680 ml  Output               400 ml  Net              280 ml    LBM: Last BM Date: 05/16/17 Baseline Weight: Weight: 98.9 kg (218 lb) Most recent weight: Weight: 92.7 kg (204 lb 6.4 oz)     Palliative Assessment/Data:   Flowsheet Rows     Most Recent Value  Intake Tab  Referral Department  Hospitalist  Unit at Time of Referral  Med/Surg Unit  Palliative Care Primary Diagnosis  Pulmonary  Date Notified  05/17/17  Palliative Care Type  New Palliative care  Reason for referral  Clarify Goals of Care  Date of Admission  05/14/17  # of days IP prior to Palliative referral  3  Clinical Assessment  Palliative Performance Scale Score  30%  Psychosocial & Spiritual Assessment  Palliative Care Outcomes      Time In: 12:00 pm Time Out: 1:15 pm Time Total: 75 min. Greater than 50%  of this time was spent counseling and coordinating care related to the above assessment and plan.  Signed by: Imogene Burn, PA-C Palliative Medicine Pager: [phone]  Please contact Palliative Medicine Team phone at [phone] for questions and concerns.  For individual provider: See Shea Evans

## 2017-05-19 NOTE — Progress Notes (Signed)
PT Cancellation Note  Patient Details Name: Diane Frey MRN: [id number] DOB: 05-Nov-1941   Cancelled Treatment:    Reason Eval/Treat Not Completed: Other (comment) Spoke with RN and Imogene Burn, PA with palliative care about GOC. Report pt was bed bound prior to admission and will be d/c'd to SNF with hospice care given current medical status. Family not present in room, however, given conversations with RN and current mobility status, pt has no further skilled PT needs, so will sign off, as further needs can be met at next level of care. Please re-consult if needs change.   Nicky Pugh, PT, DPT  Acute Rehabilitation Services  Pager: [phone]    Army Melia 05/19/2017, 2:41 PM

## 2017-05-19 NOTE — Progress Notes (Signed)
[facility]                                                                              Patient Demographics  Diane Frey, is a 76 y.o. female, DOB - October 20, 1941, WGY:[id number]  Admit date - 05/14/2017   Admitting Physician Edwin Dada, MD  Outpatient Primary MD for the patient is Hendricks Limes, MD  Outpatient specialists:   LOS - 4  days   Medical records reviewed and are as summarized below:  Chief Complaint  Patient presents with  . 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 Per history, patient was wheezing and gurgling, coughing this week was noticed to be hypoxic at the facility. Chest x-ray showed bibasilar pneumonia.   Assessment & Plan  Acute Hypoxic resp failure -due to Pleural effusions, possible CAp -Continue IV Zithromax, ceftriaxone. -CTA chest showed extensive right axillary adenopathy with suspected right breast mass, appearance concerning for metastatic breast cancer, large b/l pleural effusions.  - s/p Ultrasound-guided thoracentesis 6/12, fluid exudative , path pending - concerning for malignant effusion -Palliative meeting today  R breast mass with axillar adenopathy/large bilateral pleural effusions -noted on CTA -s/p Thora yesterday-path pending -Palliative medicine consulted for Goals of care by Dr.Rai 6/11 after discussion with daughter as patient has dementia, is legally blind and bed bound, hence pursuing aggressive workup would be futile  Suspected CHF:  - Has hx of CAD, on Lasix outpatient. No peripheral edema, BNP normal  -Continue furosemide - did not order echo, last ECHO 2011 with normal EF   Hypertension:  -Continue beta-blocker -Continue statin, aspirin   Dementia:  -Continue sertraline, Namenda, Aricept  Chronic Anemia:  - Currently not  anemic. -H&H is stable, continue iron  Seizures:  In setting of old AVM, treated at OSH.  No reported seizure activity recently. -Continue Keppra   Hypokalemia - Resolved   Code Status: full  DVT Prophylaxis:  Lovenox Family Communication: None at bedside Disposition Plan: SNF with ?Hospice in 1-2days Time Spent in minutes   25 minutes  Procedures:  None  Consultants:   Palliative med   Antimicrobials:   Zithromax 6/9  Rocephin 6/9   Medications  Scheduled Meds: . aspirin  81 mg Oral Daily  . atorvastatin  40 mg Oral Daily  . azithromycin  500 mg Oral Daily  . carvedilol  25 mg Oral BID WC  . donepezil  10 mg Oral QHS  . enoxaparin (LOVENOX) injection  40 mg Subcutaneous Q24H  . ferrous sulfate  325 mg Oral Q breakfast  . furosemide  40 mg Oral Daily  . ipratropium  0.5 mg Nebulization BID   And  . levalbuterol  0.63 mg Nebulization BID  . latanoprost  1 drop Both Eyes QHS  . levETIRAcetam  500 mg Oral BID  . Melatonin  1 tablet Oral QHS  . memantine  28 mg Oral Daily  . methylPREDNISolone (SOLU-MEDROL) injection  60 mg Intravenous Q12H  . sertraline  50 mg  Oral QHS   Continuous Infusions: . cefTRIAXone (ROCEPHIN)  IV Stopped (05/19/17 1000)   PRN Meds:.acetaminophen **OR** acetaminophen, levalbuterol, lidocaine, morphine CONCENTRATE, ondansetron **OR** ondansetron (ZOFRAN) IV   Antibiotics   Anti-infectives    Start     Dose/Rate Route Frequency Ordered Stop   05/18/17 0000  azithromycin (ZITHROMAX) 500 MG tablet     500 mg Oral Daily 05/17/17 1051     05/17/17 1000  azithromycin (ZITHROMAX) tablet 500 mg     500 mg Oral Daily 05/16/17 1143     05/17/17 0000  cefpodoxime (VANTIN) 100 MG tablet     100 mg Oral 2 times daily 05/17/17 1051     05/15/17 1000  cefTRIAXone (ROCEPHIN) 1 g in dextrose 5 % 50 mL IVPB     1 g 100 mL/hr over 30 Minutes Intravenous Every 24 hours 05/15/17 0259 05/22/17 0959   05/15/17 0315  azithromycin (ZITHROMAX) 500  mg in dextrose 5 % 250 mL IVPB  Status:  Discontinued     500 mg 250 mL/hr over 60 Minutes Intravenous Daily 05/15/17 0259 05/16/17 1143   05/14/17 2330  vancomycin (VANCOCIN) IVPB 1000 mg/200 mL premix     1,000 mg 200 mL/hr over 60 Minutes Intravenous  Once 05/14/17 2315 05/15/17 0151   05/14/17 2330  ceFEPIme (MAXIPIME) 2 g in dextrose 5 % 50 mL IVPB     2 g 100 mL/hr over 30 Minutes Intravenous  Once 05/14/17 2315 05/15/17 0042        Subjective:   Breathing labored at times, otherwise ok, no chest pain  Objective:   Vitals:   05/18/17 2145 05/19/17 0557 05/19/17 0731 05/19/17 1403  BP: (!) 152/88 (!) 172/84  (!) 160/73  Pulse: 70 64  64  Resp: 18 18  20   Temp: 98.2 F (36.8 C) 97.5 F (36.4 C)  98.2 F (36.8 C)  TempSrc: Oral Oral  Oral  SpO2: 97% 96% 93% 95%  Weight:      Height:        Intake/Output Summary (Last 24 hours) at 05/19/17 1422 Last data filed at 05/19/17 1418  Gross per 24 hour  Intake              803 ml  Output              700 ml  Net              103 ml     Wt Readings from Last 3 Encounters:  05/16/17 92.7 kg (204 lb 6.4 oz)  05/05/17 91.4 kg (201 lb 9.6 oz)  03/24/17 93.1 kg (205 lb 3.2 oz)     Exam Awake Alert, Oriented X 2, frail obese, chronically ill apearing HEENT: [place].AT,PERRAL, supple Neck,No JVD,  Lungs: decreased BS at bases CVS: RRR,No Gallops,Rubs or new Murmurs  Abd: soft, NT, BS present+ve B.Sounds, Abd Soft, No tenderness, No organomegaly   Ext: no edema /c/c   Data Reviewed:  I have personally reviewed following labs and imaging studies  Micro Results Recent Results (from the past 240 hour(s))  Blood culture (routine x 2)     Status: None (Preliminary result)   Collection Time: 05/14/17 11:00 PM  Result Value Ref Range Status   Specimen Description BLOOD RIGHT ARM  Final   Special Requests IN PEDIATRIC BOTTLE Blood Culture adequate volume  Final   Culture NO GROWTH 4 DAYS  Final   Report Status PENDING   Incomplete  Blood culture (routine x  2)     Status: None (Preliminary result)   Collection Time: 05/14/17 11:03 PM  Result Value Ref Range Status   Specimen Description BLOOD LEFT ANTECUBITAL  Final   Special Requests   Final    BOTTLES DRAWN AEROBIC AND ANAEROBIC Blood Culture adequate volume   Culture NO GROWTH 4 DAYS  Final   Report Status PENDING  Incomplete  MRSA PCR Screening     Status: None   Collection Time: 05/15/17  5:16 AM  Result Value Ref Range Status   MRSA by PCR NEGATIVE NEGATIVE Final    Comment:        The GeneXpert MRSA Assay (FDA approved for NASAL specimens only), is one component of a comprehensive MRSA colonization surveillance program. It is not intended to diagnose MRSA infection nor to guide or monitor treatment for MRSA infections.   Gram stain     Status: None   Collection Time: 05/18/17  4:05 PM  Result Value Ref Range Status   Specimen Description FLUID LEFT PLEURAL  Final   Special Requests NONE  Final   Gram Stain   Final    FEW WBC PRESENT, PREDOMINANTLY MONONUCLEAR NO ORGANISMS SEEN    Report Status 05/19/2017 FINAL  Final  Culture, body fluid-bottle     Status: None (Preliminary result)   Collection Time: 05/18/17  4:05 PM  Result Value Ref Range Status   Specimen Description FLUID LEFT PLEURAL  Final   Special Requests NONE  Final   Culture NO GROWTH < 24 HOURS  Final   Report Status PENDING  Incomplete    Radiology Reports Dg Chest 1 View  Result Date: 05/18/2017 CLINICAL DATA:  Status post left thoracentesis. EXAM: CHEST 1 VIEW COMPARISON:  05/14/2017 FINDINGS: There is moderate cardiac enlargement. Aortic atherosclerosis noted. There has been interval decrease in volume of left pleural effusion status post thoracentesis. No pneumothorax visualized. Areas of atelectasis within the left midlung and left base noted. IMPRESSION: 1. No pneumothorax following left-sided thoracentesis. Electronically Signed   By: Kerby Moors M.D.   On:  05/18/2017 16:05   Dg Chest 2 View  Result Date: 05/14/2017 CLINICAL DATA:  Pneumonia, hypertension EXAM: CHEST  2 VIEW COMPARISON:  09/16/2015 FINDINGS: Bilateral pleural effusions, left greater than right. Bilateral lower lung atelectasis or pneumonia. There is cardiomegaly with central congestion and moderate perihilar edema. No pneumothorax. IMPRESSION: 1. Cardiomegaly with vascular congestion and moderate perihilar edema 2. Left greater than right pleural effusions with dense bibasilar atelectasis or pneumonia. Electronically Signed   By: Donavan Foil M.D.   On: 05/14/2017 22:38   Ct Angio Chest Pe W Or Wo Contrast  Result Date: 05/17/2017 CLINICAL DATA:  Hypoxia and shortness of breath. EXAM: CT ANGIOGRAPHY CHEST WITH CONTRAST TECHNIQUE: Multidetector CT imaging of the chest was performed using the standard protocol during bolus administration of intravenous contrast. Multiplanar CT image reconstructions and MIPs were obtained to evaluate the vascular anatomy. CONTRAST:  100 cc Isovue 370 COMPARISON:  Chest radiograph 05/14/2017 FINDINGS: Cardiovascular: No filling defect is identified in the pulmonary arterial tree to suggest pulmonary embolus. Coronary, aortic arch, and branch vessel atherosclerotic vascular disease. Small pericardial effusion. Mediastinum/Nodes: There is right axillary adenopathy, with 1 right axillary mass measuring 3.5 by 4.2 cm on image 76/7. Skin thickening in the right breast with suspected subareolar mass, image 134/7. Edema in the right breast. Right supraclavicular node 1.4 cm in short axis on image 13/7. Right hilar and suprahilar adenopathy is confluent and extends into  the mediastinum, surrounding the rib right mainstem bronchus and right central tracheobronchial tree. Because this is so confluent, it is difficult to measure, but adenopathy between the right mainstem bronchus and the IVC measures 1.5 cm in thickness, and a right hilar node is measures proximally 1.6 cm in  thickness. Suspected indistinct subcarinal adenopathy. Lungs/Pleura: Solid pulmonary nodules are scattered in both lungs. Many of these are subpleural in location. An index right lower lobe nodule on image 77/6 measures 1.4 by 0.9 cm. Large bilateral pleural effusions are present, especially on the long the left. Passive atelectasis of most of the left lung. There is some mosaic attenuation in the lungs which may be due to edema. Narrowing of the tracheobronchial tree is present centrally, and a component of bronchomalacia is not excluded. Some of the appearance may be due to wall thickening along the central bronchial tree bilaterally. There is some unusual low-density along the left central tracheobronchial tree, lower in density than the surrounding atelectatic lung, possibly from adenopathy or airspace filling process centrally. Upper Abdomen: Imaging extensor most but not all of the left hemidiaphragm. Visualized upper abdominal structures grossly unremarkable aside from atherosclerosis. Musculoskeletal: Considerable thoracic and lower cervical spondylosis. Review of the MIP images confirms the above findings. IMPRESSION: 1. Extensive right axillary adenopathy with suspected right breast mass, appearance concerning for metastatic breast cancer. 2. Large bilateral pleural effusions with innumerable scattered pulmonary nodules, primarily in subpleural locations, and with associated passive atelectasis. Extensive perihilar soft tissue density suggesting adenopathy bilaterally. Overall the tools and thoracic adenopathy are very likely due to metastatic malignancy, less likely to be due to entities such as sarcoidosis. The large pleural effusions may be contributing to the patient's hypoxia. 3. Either airway thickening or bronchomalacia is causing narrowing of the central bronchial tree bilaterally. Extrinsic narrowing from the surrounding adenopathy may also be contributory. 4. Mosaic attenuation in the lungs  potentially from mild edema or air trapping. 5.  Aortic Atherosclerosis (ICD10-I70.0).  Coronary atherosclerosis. 6. Small pericardial effusion. These results will be called to the ordering clinician or representative by the Radiologist Assistant, and communication documented in the PACS or zVision Dashboard. Electronically Signed   By: Van Clines M.D.   On: 05/17/2017 17:29   Ir Thoracentesis Asp Pleural Space W/img Guide  Result Date: 05/18/2017 INDICATION: Symptomatic left sided pleural effusion EXAM: IR THORACENTESIS ASP PLEURAL SPACE W/IMG GUIDE COMPARISON:  None. MEDICATIONS: 10 cc 1% lidocaine COMPLICATIONS: None immediate. TECHNIQUE: Informed written consent was obtained from the patient after a discussion of the risks, benefits and alternatives to treatment. A timeout was performed prior to the initiation of the procedure. Initial ultrasound scanning demonstrates a left pleural effusion. The lower chest was prepped and draped in the usual sterile fashion. 1% lidocaine was used for local anesthesia. Under direct ultrasound guidance, a 19 gauge, 7-cm, Yueh catheter was introduced. An ultrasound image was saved for documentation purposes. The thoracentesis was performed. The catheter was removed and a dressing was applied. The patient tolerated the procedure well without immediate post procedural complication. The patient was escorted to have an upright chest radiograph. FINDINGS: A total of approximately 800 cc of dark yellow fluid was removed. Requested samples were sent to the laboratory. IMPRESSION: Successful ultrasound-guided left sided thoracentesis yielding 800 cc of pleural fluid. Read by Lavonia Drafts [facility] Electronically Signed   By: Corrie Mckusick D.O.   On: 05/18/2017 15:53    Lab Data:  CBC:  Recent Labs Lab 05/14/17 2222 05/16/17 0519 05/19/17  0504  WBC 7.0 6.5 6.5  NEUTROABS 4.7  --   --   HGB 13.2 12.2 12.6  HCT 42.2 39.6 40.7  MCV 92.7 92.5 91.3  PLT 190 177 794    Basic Metabolic Panel:  Recent Labs Lab 05/15/17 0130 05/16/17 0519 05/17/17 0507 05/19/17 0717  NA 142 144 143 142  K 3.1* 3.7 4.0 3.4*  CL 106 103 101 101  CO2 27 32 31 28  GLUCOSE 217* 87 135* 109*  BUN 23* 24* 25* 34*  CREATININE 0.86 1.09* 1.02* 1.07*  CALCIUM 7.9* 9.1 9.3 8.7*   GFR: Estimated Creatinine Clearance: 48.9 mL/min (A) (by C-G formula based on SCr of 1.07 mg/dL (H)). Liver Function Tests:  Recent Labs Lab 05/15/17 0130  AST 24  ALT 12*  ALKPHOS 71  BILITOT 1.0  PROT 5.5*  ALBUMIN 2.6*   No results for input(s): LIPASE, AMYLASE in the last 168 hours. No results for input(s): AMMONIA in the last 168 hours. Coagulation Profile: No results for input(s): INR, PROTIME in the last 168 hours. Cardiac Enzymes:  Recent Labs Lab 05/15/17 0130  TROPONINI <0.03   BNP (last 3 results) No results for input(s): PROBNP in the last 8760 hours. HbA1C: No results for input(s): HGBA1C in the last 72 hours. CBG: No results for input(s): GLUCAP in the last 168 hours. Lipid Profile: No results for input(s): CHOL, HDL, LDLCALC, TRIG, CHOLHDL, LDLDIRECT in the last 72 hours. Thyroid Function Tests: No results for input(s): TSH, T4TOTAL, FREET4, T3FREE, THYROIDAB in the last 72 hours. Anemia Panel: No results for input(s): VITAMINB12, FOLATE, FERRITIN, TIBC, IRON, RETICCTPCT in the last 72 hours. Urine analysis:    Component Value Date/Time   COLORURINE STRAW (A) 03/30/2016 1407   APPEARANCEUR CLEAR 03/30/2016 1407   LABSPEC 1.006 03/30/2016 1407   PHURINE 7.0 03/30/2016 1407   GLUCOSEU NEGATIVE 03/30/2016 1407   GLUCOSEU NEGATIVE 06/03/2015 1344   HGBUR LARGE (A) 03/30/2016 1407   BILIRUBINUR NEGATIVE 03/30/2016 1407   BILIRUBINUR neg 05/17/2015 1134   KETONESUR NEGATIVE 03/30/2016 1407   PROTEINUR NEGATIVE 03/30/2016 1407   UROBILINOGEN 0.2 09/16/2015 1508   NITRITE NEGATIVE 03/30/2016 1407   LEUKOCYTESUR NEGATIVE 03/30/2016 [place] M.D. [facility] 05/19/2017, 2:22 PM  [email or web address] - password [email or web address]  Call night coverage person covering after 7pm

## 2017-05-20 DIAGNOSIS — Z7189 Other specified counseling: Secondary | ICD-10-CM

## 2017-05-20 DIAGNOSIS — K219 Gastro-esophageal reflux disease without esophagitis: Secondary | ICD-10-CM | POA: Diagnosis not present

## 2017-05-20 DIAGNOSIS — C50919 Malignant neoplasm of unspecified site of unspecified female breast: Secondary | ICD-10-CM | POA: Diagnosis not present

## 2017-05-20 DIAGNOSIS — F0280 Dementia in other diseases classified elsewhere without behavioral disturbance: Secondary | ICD-10-CM | POA: Diagnosis not present

## 2017-05-20 DIAGNOSIS — C50911 Malignant neoplasm of unspecified site of right female breast: Secondary | ICD-10-CM | POA: Diagnosis not present

## 2017-05-20 DIAGNOSIS — M170 Bilateral primary osteoarthritis of knee: Secondary | ICD-10-CM | POA: Diagnosis not present

## 2017-05-20 DIAGNOSIS — E785 Hyperlipidemia, unspecified: Secondary | ICD-10-CM | POA: Diagnosis not present

## 2017-05-20 DIAGNOSIS — I240 Acute coronary thrombosis not resulting in myocardial infarction: Secondary | ICD-10-CM | POA: Diagnosis not present

## 2017-05-20 DIAGNOSIS — J90 Pleural effusion, not elsewhere classified: Secondary | ICD-10-CM | POA: Diagnosis not present

## 2017-05-20 DIAGNOSIS — M6281 Muscle weakness (generalized): Secondary | ICD-10-CM | POA: Diagnosis not present

## 2017-05-20 DIAGNOSIS — Z515 Encounter for palliative care: Secondary | ICD-10-CM

## 2017-05-20 DIAGNOSIS — G4733 Obstructive sleep apnea (adult) (pediatric): Secondary | ICD-10-CM | POA: Diagnosis not present

## 2017-05-20 DIAGNOSIS — G309 Alzheimer's disease, unspecified: Secondary | ICD-10-CM | POA: Diagnosis not present

## 2017-05-20 DIAGNOSIS — J918 Pleural effusion in other conditions classified elsewhere: Secondary | ICD-10-CM | POA: Diagnosis not present

## 2017-05-20 DIAGNOSIS — D500 Iron deficiency anemia secondary to blood loss (chronic): Secondary | ICD-10-CM | POA: Diagnosis not present

## 2017-05-20 DIAGNOSIS — R569 Unspecified convulsions: Secondary | ICD-10-CM | POA: Diagnosis not present

## 2017-05-20 DIAGNOSIS — I110 Hypertensive heart disease with heart failure: Secondary | ICD-10-CM | POA: Diagnosis not present

## 2017-05-20 DIAGNOSIS — H409 Unspecified glaucoma: Secondary | ICD-10-CM | POA: Diagnosis not present

## 2017-05-20 DIAGNOSIS — I2510 Atherosclerotic heart disease of native coronary artery without angina pectoris: Secondary | ICD-10-CM | POA: Diagnosis not present

## 2017-05-20 DIAGNOSIS — I5033 Acute on chronic diastolic (congestive) heart failure: Secondary | ICD-10-CM | POA: Diagnosis not present

## 2017-05-20 DIAGNOSIS — J189 Pneumonia, unspecified organism: Secondary | ICD-10-CM | POA: Diagnosis not present

## 2017-05-20 DIAGNOSIS — R1312 Dysphagia, oropharyngeal phase: Secondary | ICD-10-CM | POA: Diagnosis not present

## 2017-05-20 DIAGNOSIS — R627 Adult failure to thrive: Secondary | ICD-10-CM | POA: Diagnosis not present

## 2017-05-20 DIAGNOSIS — J9600 Acute respiratory failure, unspecified whether with hypoxia or hypercapnia: Secondary | ICD-10-CM | POA: Diagnosis not present

## 2017-05-20 DIAGNOSIS — H548 Legal blindness, as defined in USA: Secondary | ICD-10-CM | POA: Diagnosis not present

## 2017-05-20 LAB — BASIC METABOLIC PANEL
Anion gap: 12 (ref 5–15)
BUN: 33 mg/dL — ABNORMAL HIGH (ref 6–20)
CALCIUM: 8.8 mg/dL — AB (ref 8.9–10.3)
CHLORIDE: 104 mmol/L (ref 101–111)
CO2: 27 mmol/L (ref 22–32)
CREATININE: 0.97 mg/dL (ref 0.44–1.00)
GFR calc non Af Amer: 56 mL/min — ABNORMAL LOW (ref >60)
Glucose, Bld: 116 mg/dL — ABNORMAL HIGH (ref 65–99)
Potassium: 3.9 mmol/L (ref 3.5–5.1)
SODIUM: 143 mmol/L (ref 135–145)

## 2017-05-20 LAB — CBC
HCT: 43.5 % (ref 36.0–46.0)
Hemoglobin: 13.9 g/dL (ref 12.0–15.0)
MCH: 29.2 pg (ref 26.0–34.0)
MCHC: 32 g/dL (ref 30.0–36.0)
MCV: 91.4 fL (ref 78.0–100.0)
PLATELETS: 213 10*3/uL (ref 150–400)
RBC: 4.76 MIL/uL (ref 3.87–5.11)
RDW: 14.1 % (ref 11.5–15.5)
WBC: 8.5 10*3/uL (ref 4.0–10.5)

## 2017-05-20 LAB — CULTURE, BLOOD (ROUTINE X 2)
CULTURE: NO GROWTH
Culture: NO GROWTH
Special Requests: ADEQUATE
Special Requests: ADEQUATE

## 2017-05-20 MED ORDER — MORPHINE SULFATE (CONCENTRATE) 10 MG/0.5ML PO SOLN: 5.0000 mg | ORAL | 0 refills | Status: DC | PRN

## 2017-05-20 MED ORDER — MORPHINE SULFATE (CONCENTRATE) 10 MG/0.5ML PO SOLN: 5.0000 mg | ORAL | 0 refills | Status: AC | PRN

## 2017-05-20 MED ORDER — POLYETHYLENE GLYCOL 3350 17 G PO PACK: 17.0000 g | PACK | Freq: Every day | ORAL | 0 refills | Status: DC

## 2017-05-20 MED ORDER — METHYLPREDNISOLONE SODIUM SUCC 40 MG IJ SOLR: 40.0000 mg | Freq: Two times a day (BID) | INTRAMUSCULAR | Status: DC

## 2017-05-20 MED ORDER — CEFUROXIME AXETIL 250 MG PO TABS: 250.0000 mg | ORAL_TABLET | Freq: Two times a day (BID) | ORAL | 0 refills | Status: DC

## 2017-05-20 MED ORDER — CEFUROXIME AXETIL 250 MG PO TABS
250.0000 mg | ORAL_TABLET | Freq: Two times a day (BID) | ORAL | Status: DC
  Administered 2017-05-20: 250 mg via ORAL
  Filled 2017-05-20 (×2): qty 1

## 2017-05-20 NOTE — Care Management Important Message (Signed)
Important Message  Patient Details  Name: JACQUILYN SELDON MRN: [id number] Date of Birth: [birth year]   Medicare Important Message Given:  Yes    Erenest Rasher, RN 05/20/2017, 12:12 PM

## 2017-05-20 NOTE — Care Management Note (Signed)
Case Management Note  Patient Details  Name: RENI HAUSNER MRN: [id number] Date of Birth: 03-03-1941  Subjective/Objective:       CAP, bilateral pleural effusions, right breast mass, adult FTT             Action/Plan: Discharge Planning:  Chart reviewed. CSW following for SNF placement with Hospice. Scheduled dc today to SNF.   PCP HOPPER, Darrick Penna   Expected Discharge Date:  05/20/17               Expected Discharge Plan:  Skilled Nursing Facility  In-House Referral:  Clinical Social Work  Discharge planning Services  CM Consult  Post Acute Care Choice:  NA Choice offered to:  NA  DME Arranged:  N/A DME Agency:  NA  HH Arranged:  NA HH Agency:  NA  Status of Service:  Completed, signed off  If discussed at [place] of Stay Meetings, dates discussed:    Additional Comments:  Erenest Rasher, RN 05/20/2017, 12:09 PM

## 2017-05-20 NOTE — Discharge Summary (Addendum)
Physician Discharge Summary  Diane Frey JXB:[id number] DOB: Sep 19, 1941 DOA: 05/14/2017  PCP: Hendricks Limes, MD  Admit date: 05/14/2017 Discharge date: 05/20/2017  Time spent: 35 minutes  Recommendations for Outpatient Follow-up:  PCP Dr.Hopper in 1 week, please FU Pleural fluid cytology  SNF with Hospice Services for comfort focused care  Family would like for facility MD / RN and Hospice to carefully monitor for distressed breathing (the return of pleural effusion) - and provide for palliative thoracentesis when needed for comfort   Discharge Diagnoses:    ACute Hypoxic resp failure   R breast mass with extensive axillar adenopathy/hilar adenopathy, pulm nodules and bilateral pleural effusions   Community acquired pneumonia   H/o CVA   BEd Bound   Adult failure to thrive   Dementia   Coronary atherosclerosis   Essential hypertension   Seizure ([place])   Chronic anemia   Acute on chronic diastolic congestive heart failure (HCC)   Hypoxia   Pleural effusion   Palliative care encounter   DNR (do not resuscitate)   Goals of care, counseling/discussion   Discharge Condition: poor  Diet recommendation: comfort feeds  Filed Weights   05/15/17 0436 05/16/17 0012 05/20/17 0132  Weight: 91.7 kg (202 lb 1.6 oz) 92.7 kg (204 lb 6.4 oz) 90.9 kg (200 lb 6.4 oz)    History of present illness:  Patient is a 76 year old female with advanced dementia, chronic blood loss anemia, 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 Per history, patient was wheezing and gurgling, coughing this week was noticed to be hypoxic at the facility.   Hospital Course:  Acute Hypoxic resp failure -due to Pleural effusions, possible Pneumonia -treated with IV Zithromax, ceftriaxone and then changed to Po Cefuroxime -Subsequently underwent a CTA chest during hospitalization which showed extensive right axillary adenopathy with Right breast mass, appearance  concerning for metastatic breast cancer, and with pulmonary nodules with large b/l pleural effusions raises the suspicion of malignant effusion. - s/p Ultrasound-guided thoracentesis 6/12, fluid exudative , path pending at this time - remains stable after thoracentesis now -see further discussions below  R breast mass extensive axillar adenopathy/large bilateral pleural effusions/hilar adenopathy and lung nodules -noted on CTA chest this admission -highly suspicious for metastatic breast CA -s/p Thoracentesis 6/12 with 800cc drained-path pending -Palliative medicine consulted for Goals of care by Dr.Rai 6/11 after discussion with daughter as patient has dementia, is legally blind and bed bound, hence pursuing aggressive workup would be futile -s/p Family meeting yesterday and decision made by Family to focus on comfort due to overall debility with Dementia, CVA, Bed Bound status and new findings as above highly suspicious for malignancy -decision made for discharge to SNF with Hospice/Comfort Care  Suspected CHF: - Has hx of CAD, on Lasix outpatient.No peripheral edema, BNP normal  -Continue furosemide - did not order echo, last ECHO 2011 with normal EF   Hypertension: -Continue beta-blocker -Continue statin, aspirin   Dementia: -Continue sertraline, Namenda, Aricept  Chronic Anemia: -Currently not anemic. 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 No reported seizure activity recently. -Continue Keppra   Hypokalemia - Resolved  Procedures:Thoracentesis 6/12  Consultations:  Palliative medicine  Discharge Exam: Vitals:   05/20/17 0607 05/20/17 1033  BP: (!) 152/83 (!) 156/73  Pulse: (!) 55 71  Resp: 17   Temp: 97.7 F (36.5 C)     General: AAOx2 Cardiovascular: S1S2/RRR Respiratory: Diminished BS at bases  Discharge  Instructions   Discharge Instructions    Diet - low sodium heart healthy    Complete by:   As directed    Increase activity slowly    Complete by:  As directed      Current Discharge Medication List    START taking these medications   Details  cefUROXime (CEFTIN) 250 MG tablet Take 1 tablet (250 mg total) by mouth 2 (two) times daily with a meal. For 1day Qty: 2 tablet, Refills: 0    Morphine Sulfate (MORPHINE CONCENTRATE) 10 MG/0.5ML SOLN concentrated solution Place 0.25 mLs (5 mg total) under the tongue every 4 (four) hours as needed for severe pain or shortness of breath (For shortness of breath or increased work of breathing.). Qty: 30 mL, Refills: 0    polyethylene glycol (MIRALAX / GLYCOLAX) packet Take 17 g by mouth daily. Qty: 14 each, Refills: 0      CONTINUE these medications which have NOT CHANGED   Details  acetaminophen (TYLENOL) 325 MG tablet Take 2 tablets (650 mg total) by mouth every 6 (six) hours as needed for mild pain (or Fever >/= 101). Qty: 30 tablet, Refills: 0    aspirin ([employer] [facility] ASPIRIN) 81 MG chewable tablet TAKE 1 TABLET (81 MG TOTAL) BY MOUTH DAILY. Qty: 36 tablet, Refills: 5    atorvastatin (LIPITOR) 40 MG tablet Take 1 tablet (40 mg total) by mouth daily. Qty: 30 tablet, Refills: 5    Calcium-Vitamin D 600-200 MG-UNIT tablet Take 1 tablet by mouth daily.    carvedilol (COREG) 25 MG tablet TAKE 1 TABLET BY MOUTH TWICE A DAY WITH A MEAL Qty: 60 tablet, Refills: 5    donepezil (ARICEPT) 10 MG tablet Take 1 tablet (10 mg total) by mouth at bedtime. Qty: 30 tablet, Refills: 5    ferrous sulfate 325 (65 FE) MG tablet Take 325 mg by mouth daily with breakfast.    furosemide (LASIX) 40 MG tablet Take 1 tablet (40 mg total) by mouth daily. Qty: 30 tablet, Refills: 5    ipratropium-albuterol (DUONEB) 0.5-2.5 (3) MG/3ML SOLN Take 3 mLs by nebulization every 6 (six) hours.    latanoprost (XALATAN) 0.005 % ophthalmic solution Place 1 drop into both eyes at bedtime.    levETIRAcetam (KEPPRA) 500 MG tablet Take 1 tablet (500 mg total) by  mouth 2 (two) times daily. Qty: 60 tablet, Refills: 0    lisinopril (PRINIVIL,ZESTRIL) 40 MG tablet Take 1 tablet (40 mg total) by mouth daily. Qty: 30 tablet, Refills: 5    Melatonin 3 MG TABS Take 1 tablet by mouth at bedtime.    memantine (NAMENDA XR) 28 MG CP24 24 hr capsule Take 28 mg by mouth daily.    sertraline (ZOLOFT) 50 MG tablet Take 50 mg by mouth at bedtime.      STOP taking these medications     levofloxacin (LEVAQUIN) 500 MG tablet        No Known Allergies Follow-up Information    Hendricks Limes, MD. Schedule an appointment as soon as possible for a visit in 1 week(s).   Specialty:  Internal Medicine Contact information: [place] [address] [phone]            The results of significant diagnostics from this hospitalization (including imaging, microbiology, ancillary and laboratory) are listed below for reference.    Significant Diagnostic Studies: Dg Chest 1 View  Result Date: 05/18/2017 CLINICAL DATA:  Status post left thoracentesis. EXAM: CHEST 1 VIEW COMPARISON:  05/14/2017 FINDINGS: There is moderate cardiac enlargement. Aortic atherosclerosis noted. There has been interval decrease in volume of left pleural effusion status post thoracentesis. No pneumothorax visualized. Areas of atelectasis within the left midlung and left base noted. IMPRESSION: 1. No pneumothorax following left-sided thoracentesis. Electronically Signed   By: Kerby Moors M.D.   On: 05/18/2017 16:05   Dg Chest 2 View  Result Date: 05/14/2017 CLINICAL DATA:  Pneumonia, hypertension EXAM: CHEST  2 VIEW COMPARISON:  09/16/2015 FINDINGS: Bilateral pleural effusions, left greater than right. Bilateral lower lung atelectasis or pneumonia. There is cardiomegaly with central congestion and moderate perihilar edema. No pneumothorax. IMPRESSION: 1. Cardiomegaly with vascular congestion and moderate perihilar edema 2. Left greater than right pleural effusions with dense  bibasilar atelectasis or pneumonia. Electronically Signed   By: Donavan Foil M.D.   On: 05/14/2017 22:38   Ct Angio Chest Pe W Or Wo Contrast  Result Date: 05/17/2017 CLINICAL DATA:  Hypoxia and shortness of breath. EXAM: CT ANGIOGRAPHY CHEST WITH CONTRAST TECHNIQUE: Multidetector CT imaging of the chest was performed using the standard protocol during bolus administration of intravenous contrast. Multiplanar CT image reconstructions and MIPs were obtained to evaluate the vascular anatomy. CONTRAST:  100 cc Isovue 370 COMPARISON:  Chest radiograph 05/14/2017 FINDINGS: Cardiovascular: No filling defect is identified in the pulmonary arterial tree to suggest pulmonary embolus. Coronary, aortic arch, and branch vessel atherosclerotic vascular disease. Small pericardial effusion. Mediastinum/Nodes: There is right axillary adenopathy, with 1 right axillary mass measuring 3.5 by 4.2 cm on image 76/7. Skin thickening in the right breast with suspected subareolar mass, image 134/7. Edema in the right breast. Right supraclavicular node 1.4 cm in short axis on image 13/7. Right hilar and suprahilar adenopathy is confluent and extends into the mediastinum, surrounding the rib right mainstem bronchus and right central tracheobronchial tree. Because this is so confluent, it is difficult to measure, but adenopathy between the right mainstem bronchus and the IVC measures 1.5 cm in thickness, and a right hilar node is measures proximally 1.6 cm in thickness. Suspected indistinct subcarinal adenopathy. Lungs/Pleura: Solid pulmonary nodules are scattered in both lungs. Many of these are subpleural in location. An index right lower lobe nodule on image 77/6 measures 1.4 by 0.9 cm. Large bilateral pleural effusions are present, especially on the long the left. Passive atelectasis of most of the left lung. There is some mosaic attenuation in the lungs which may be due to edema. Narrowing of the tracheobronchial tree is present  centrally, and a component of bronchomalacia is not excluded. Some of the appearance may be due to wall thickening along the central bronchial tree bilaterally. There is some unusual low-density along the left central tracheobronchial tree, lower in density than the surrounding atelectatic lung, possibly from adenopathy or airspace filling process centrally. Upper Abdomen: Imaging extensor most but not all of the left hemidiaphragm. Visualized upper abdominal structures grossly unremarkable aside from atherosclerosis. Musculoskeletal: Considerable thoracic and lower cervical spondylosis. Review of the MIP images confirms the above findings. IMPRESSION: 1. Extensive right axillary adenopathy with suspected right breast mass, appearance concerning for metastatic breast cancer. 2. Large bilateral pleural effusions with innumerable scattered pulmonary nodules, primarily in subpleural locations, and with associated passive atelectasis. Extensive perihilar soft tissue density suggesting adenopathy bilaterally. Overall the tools and thoracic adenopathy are very likely due to metastatic malignancy, less likely to be due to entities such as sarcoidosis. The large pleural effusions may be contributing to the patient's hypoxia. 3. Either airway thickening or  bronchomalacia is causing narrowing of the central bronchial tree bilaterally. Extrinsic narrowing from the surrounding adenopathy may also be contributory. 4. Mosaic attenuation in the lungs potentially from mild edema or air trapping. 5.  Aortic Atherosclerosis (ICD10-I70.0).  Coronary atherosclerosis. 6. Small pericardial effusion. These results will be called to the ordering clinician or representative by the Radiologist Assistant, and communication documented in the PACS or zVision Dashboard. Electronically Signed   By: Van Clines M.D.   On: 05/17/2017 17:29   Ir Thoracentesis Asp Pleural Space W/img Guide  Result Date: 05/18/2017 INDICATION: Symptomatic  left sided pleural effusion EXAM: IR THORACENTESIS ASP PLEURAL SPACE W/IMG GUIDE COMPARISON:  None. MEDICATIONS: 10 cc 1% lidocaine COMPLICATIONS: None immediate. TECHNIQUE: Informed written consent was obtained from the patient after a discussion of the risks, benefits and alternatives to treatment. A timeout was performed prior to the initiation of the procedure. Initial ultrasound scanning demonstrates a left pleural effusion. The lower chest was prepped and draped in the usual sterile fashion. 1% lidocaine was used for local anesthesia. Under direct ultrasound guidance, a 19 gauge, 7-cm, Yueh catheter was introduced. An ultrasound image was saved for documentation purposes. The thoracentesis was performed. The catheter was removed and a dressing was applied. The patient tolerated the procedure well without immediate post procedural complication. The patient was escorted to have an upright chest radiograph. FINDINGS: A total of approximately 800 cc of dark yellow fluid was removed. Requested samples were sent to the laboratory. IMPRESSION: Successful ultrasound-guided left sided thoracentesis yielding 800 cc of pleural fluid. Read by Lavonia Drafts [facility] Electronically Signed   By: Corrie Mckusick D.O.   On: 05/18/2017 15:53    Microbiology: Recent Results (from the past 240 hour(s))  Blood culture (routine x 2)     Status: None (Preliminary result)   Collection Time: 05/14/17 11:00 PM  Result Value Ref Range Status   Specimen Description BLOOD RIGHT ARM  Final   Special Requests IN PEDIATRIC BOTTLE Blood Culture adequate volume  Final   Culture NO GROWTH 4 DAYS  Final   Report Status PENDING  Incomplete  Blood culture (routine x 2)     Status: None (Preliminary result)   Collection Time: 05/14/17 11:03 PM  Result Value Ref Range Status   Specimen Description BLOOD LEFT ANTECUBITAL  Final   Special Requests   Final    BOTTLES DRAWN AEROBIC AND ANAEROBIC Blood Culture adequate volume   Culture NO  GROWTH 4 DAYS  Final   Report Status PENDING  Incomplete  MRSA PCR Screening     Status: None   Collection Time: 05/15/17  5:16 AM  Result Value Ref Range Status   MRSA by PCR NEGATIVE NEGATIVE Final    Comment:        The GeneXpert MRSA Assay (FDA approved for NASAL specimens only), is one component of a comprehensive MRSA colonization surveillance program. It is not intended to diagnose MRSA infection nor to guide or monitor treatment for MRSA infections.   Gram stain     Status: None   Collection Time: 05/18/17  4:05 PM  Result Value Ref Range Status   Specimen Description FLUID LEFT PLEURAL  Final   Special Requests NONE  Final   Gram Stain   Final    FEW WBC PRESENT, PREDOMINANTLY MONONUCLEAR NO ORGANISMS SEEN    Report Status 05/19/2017 FINAL  Final  Culture, body fluid-bottle     Status: None (Preliminary result)   Collection Time: 05/18/17  4:05  PM  Result Value Ref Range Status   Specimen Description FLUID LEFT PLEURAL  Final   Special Requests NONE  Final   Culture NO GROWTH < 24 HOURS  Final   Report Status PENDING  Incomplete     Labs: Basic Metabolic Panel:  Recent Labs Lab 05/15/17 0130 05/16/17 0519 05/17/17 0507 05/19/17 0717 05/20/17 0440  NA 142 144 143 142 143  K 3.1* 3.7 4.0 3.4* 3.9  CL 106 103 101 101 104  CO2 27 32 31 28 27   GLUCOSE 217* 87 135* 109* 116*  BUN 23* 24* 25* 34* 33*  CREATININE 0.86 1.09* 1.02* 1.07* 0.97  CALCIUM 7.9* 9.1 9.3 8.7* 8.8*   Liver Function Tests:  Recent Labs Lab 05/15/17 0130  AST 24  ALT 12*  ALKPHOS 71  BILITOT 1.0  PROT 5.5*  ALBUMIN 2.6*   No results for input(s): LIPASE, AMYLASE in the last 168 hours. No results for input(s): AMMONIA in the last 168 hours. CBC:  Recent Labs Lab 05/14/17 2222 05/16/17 0519 05/19/17 0504 05/20/17 0440  WBC 7.0 6.5 6.5 8.5  NEUTROABS 4.7  --   --   --   HGB 13.2 12.2 12.6 13.9  HCT 42.2 39.6 40.7 43.5  MCV 92.7 92.5 91.3 91.4  PLT 190 177 222 213    Cardiac Enzymes:  Recent Labs Lab 05/15/17 0130  TROPONINI <0.03   BNP: BNP (last 3 results)  Recent Labs  05/14/17 2330 05/16/17 0906  BNP 26.4 62.8    ProBNP (last 3 results) No results for input(s): PROBNP in the last 8760 hours.  CBG: No results for input(s): GLUCAP in the last 168 hours.     SignedDomenic Polite MD.  [facility] 05/20/2017, 11:42 AM

## 2017-05-20 NOTE — Progress Notes (Signed)
Patient will DC to: [facility] Anticipated DC date: 05/20/17 Family notified: Daughter Transport by: Corey Harold (2 hours behind)   Per MD patient ready for DC to [facility]. RN, patient, patient's family, and facility notified of DC. Discharge Summary sent to facility. RN given number for report [phone]). DC packet on chart. Ambulance transport requested for patient.   CSW signing off.  Cedric Fishman, [place] Social Worker [phone]

## 2017-05-21 ENCOUNTER — Other Ambulatory Visit: Payer: Self-pay | Admitting: *Deleted

## 2017-05-21 NOTE — Patient Outreach (Signed)
[facility]) Care Management  05/21/2017  Diane Frey [birth year] [id number]   Noted per EPIC patient discharged from Hospital to [facility]. Verified with Cameron Ali, SW at [facility] that patient is a LTC resident and there are no plans to discharge home. Plan to sign off as not [facility] community care management needs assessed.  Royetta Crochet. Laymond Purser, RN, BSN, [place] [phone]) Business Cell  [phone]) Toll Free Office

## 2017-05-23 LAB — CULTURE, BODY FLUID W GRAM STAIN -BOTTLE: Culture: NO GROWTH

## 2017-05-23 LAB — CULTURE, BODY FLUID-BOTTLE

## 2017-05-25 ENCOUNTER — Telehealth: Payer: Self-pay | Admitting: *Deleted

## 2017-05-26 ENCOUNTER — Encounter: Payer: Self-pay | Admitting: Internal Medicine

## 2017-05-26 ENCOUNTER — Non-Acute Institutional Stay (SKILLED_NURSING_FACILITY): Payer: Medicare Other | Admitting: Internal Medicine

## 2017-05-26 DIAGNOSIS — C50919 Malignant neoplasm of unspecified site of unspecified female breast: Secondary | ICD-10-CM | POA: Insufficient documentation

## 2017-05-26 DIAGNOSIS — J90 Pleural effusion, not elsewhere classified: Secondary | ICD-10-CM | POA: Diagnosis not present

## 2017-05-26 NOTE — Progress Notes (Signed)
NURSING HOME LOCATION:  [facility] ROOM NUMBER:  [id number]  CODE STATUS:  DNR  PCP:  Hendricks Limes, MD  [address] [place] [address]  This is a nursing facility follow up for [facility] readmission within 30 days  Interim medical record and care since last [place] visit was updated with review of diagnostic studies and change in clinical status since last visit were documented.  HPI: The patient was hospitalized 6/8-6/14/18 with acute hypoxic respiratory failure presenting as paroxysmal coughing, wheezing and "gurgling" with associated hypoxia. She was treated for pneumonia associated with pleural effusion, initially with Zithromax, ceftriaxone and then subsequently oral cefuroxime. CTA showed extensive right axillary adenopathy with subareolar right breast mass. She had innumerable pulmonary nodules with bilateral large pleural effusions. Thoracentesis of 800 mL was performed 6/12 ;this did confirm metastatic breast cancer. Palliative medicine validated aggressive workup would be futile. Family focus was comfort. Hospice was to collaborate @ the SNF.The family did however want to pursue palliative thoracentesis as needed for comfort in case of recurrent pleural effusion. The patient is legally blind and also has advanced dementia. It was elected to continue the sertraline, Namenda, Aricept although there's been no meaningful change. Keppra was continued because of history of seizures related to AVM.  Review of systems: Dementia invalidated responses. Date given as Tuesday, June,?,? (unable to provide the year). Unable to name the president.She was unaware that she had been in the hospital. She does describe some slight shortness of breath and nonproductive cough. She validates some pain in the right knee.   Constitutional: No fever,significant weight change, fatigue  Eyes: No redness, discharge, pain, vision change ENT/mouth: No nasal congestion,  purulent  discharge, earache,change in hearing ,sore throat  Cardiovascular: No chest pain, palpitations,paroxysmal nocturnal dyspnea, claudication, edema  Respiratory: No sputum production,hemoptysis,  significant snoring,apnea  Gastrointestinal: No heartburn,dysphagia,abdominal pain, nausea / vomiting,rectal bleeding, melena,change in bowels Genitourinary: No dysuria,hematuria, pyuria,  incontinence, nocturia Musculoskeletal: No joint stiffness, joint swelling, weakness Dermatologic: No rash, pruritus, change in appearance of skin Neurologic: No dizziness,headache,syncope, seizures, numbness , tingling Psychiatric: No significant anxiety , depression, insomnia, anorexia Endocrine: No change in hair/skin/ nails, excessive thirst, excessive hunger, excessive urination  Hematologic/lymphatic: No significant bruising, lymphadenopathy,abnormal bleeding Allergy/immunology: No itchy/ watery eyes, significant sneezing, urticaria, angioedema  Physical exam:  Pertinent or positive findings: Patient's oxygen was off at the time of the exam but replaced. Arcus senilis present. She was unable to count fingers to direct confrontation. She only wears upper plate. Chest is clear anteriorly but breath sounds are decreased at the bases. Abdomen is protuberant. Posterior tibial pulses are decreased. Fusiform changes in the knees are noted. She has 1/2+ edema at the sock line.  General appearance:Adequately nourished; no acute distress , increased work of breathing is present.   Lymphatic: No lymphadenopathy about the head, neck. Eyes: No conjunctival inflammation or lid edema is present. There is no scleral icterus. Ears:  External ear exam shows no significant lesions or deformities.   Nose:  External nasal examination shows no deformity or inflammation. Nasal mucosa are pink and moist without lesions ,exudates Oral exam: lips and gums are healthy appearing.There is no oropharyngeal erythema or exudate . Neck:  No  thyromegaly, masses, tenderness noted.    Heart:  Normal rate and regular rhythm. S1 and S2 normal without gallop, murmur, click, rub .  Lungs:without wheezes, rhonchi,rales , rubs. Abdomen:Bowel sounds are normal. Abdomen is soft and nontender with no organomegaly, hernias,masses.  GU: deferred  Extremities:  No cyanosis, clubbing  Neurologic exam : Strength markedly decreased  in upper & lower extremities. Minimal range of motion of the lower extremities.  Balance,Rhomberg,finger to nose testing could not be completed due to clinical state Skin: Warm & dry w/o tenting. No significant rash.  See summary under each active problem in the Problem List with associated updated therapeutic plan

## 2017-05-26 NOTE — Patient Instructions (Signed)
See assessment and plan under each diagnosis in the problem list and acutely for this visit 

## 2017-05-26 NOTE — Assessment & Plan Note (Addendum)
Family seemingly wants to pursue thoracenteses should she have increasing respiratory distress. Unfortunately repeat thoracenteses would have only transient if any benefit on symptoms as effusion would rapidly reaccumulate in context of massive tumor load as per 05/17/17 CTA of the lung. Hospice will be contacted concerning this as the family was not aware of proven diagnosis of metastatic cancer when the discussions were held with palliative care. I shall be available to meet with the family and review the CTA in person.

## 2017-05-26 NOTE — Assessment & Plan Note (Signed)
See comments under pleural effusion

## 2017-05-27 ENCOUNTER — Encounter: Payer: Self-pay | Admitting: Internal Medicine

## 2017-05-27 ENCOUNTER — Non-Acute Institutional Stay (SKILLED_NURSING_FACILITY): Payer: Medicare Other | Admitting: Internal Medicine

## 2017-05-27 DIAGNOSIS — J910 Malignant pleural effusion: Secondary | ICD-10-CM | POA: Diagnosis not present

## 2017-05-27 DIAGNOSIS — C50919 Malignant neoplasm of unspecified site of unspecified female breast: Secondary | ICD-10-CM

## 2017-05-27 DIAGNOSIS — J9600 Acute respiratory failure, unspecified whether with hypoxia or hypercapnia: Secondary | ICD-10-CM | POA: Diagnosis not present

## 2017-05-27 DIAGNOSIS — R569 Unspecified convulsions: Secondary | ICD-10-CM | POA: Diagnosis not present

## 2017-05-27 DIAGNOSIS — C7800 Secondary malignant neoplasm of unspecified lung: Secondary | ICD-10-CM | POA: Diagnosis not present

## 2017-05-27 DIAGNOSIS — J90 Pleural effusion, not elsewhere classified: Secondary | ICD-10-CM | POA: Diagnosis not present

## 2017-05-27 DIAGNOSIS — R0902 Hypoxemia: Secondary | ICD-10-CM

## 2017-05-27 DIAGNOSIS — I509 Heart failure, unspecified: Secondary | ICD-10-CM | POA: Diagnosis not present

## 2017-05-27 DIAGNOSIS — I679 Cerebrovascular disease, unspecified: Secondary | ICD-10-CM | POA: Diagnosis not present

## 2017-05-27 DIAGNOSIS — I10 Essential (primary) hypertension: Secondary | ICD-10-CM | POA: Diagnosis not present

## 2017-05-27 DIAGNOSIS — F0150 Vascular dementia without behavioral disturbance: Secondary | ICD-10-CM | POA: Diagnosis not present

## 2017-05-27 DIAGNOSIS — G9340 Encephalopathy, unspecified: Secondary | ICD-10-CM | POA: Diagnosis not present

## 2017-05-27 DIAGNOSIS — E46 Unspecified protein-calorie malnutrition: Secondary | ICD-10-CM | POA: Diagnosis not present

## 2017-05-27 NOTE — Progress Notes (Signed)
    NURSING HOME LOCATION:  [facility] ROOM NUMBER:  214-A  CODE STATUS:  DNR  PCP:  Hendricks Limes, MD  [address] [place] [address]  This is a nursing facility follow up for specific acute issue of acute respiratory distress.  Interim medical record and care since last [place] visit was updated with review of diagnostic studies and change in clinical status since last visit were documented.  HPI: Her nurse contacted me as to change in condition, patient was noted to be coughing when lying flat. The cough did resolve with elevation of head of the bed. O2 sats were 98% on 2 L. At that time the patient was not short of breath and was afebrile. Follow-up evaluation revealed temperature 99.6 & tachycardia. Pulse was 125 on auscultation and 65 peripherally. Respiratory rate was 28-30. Blood pressure 103/57. O2 sats dropped to 82% on 2 L. Hospice collaboration had been requested and family is in the process of completing this as of today.  Her CT angiogram of the chest 05/17/17 reveals an extensive tumor load with extensive right axillary lymphadenopathy and a right breast mass. Large bilateral effusions were present. Innumerable scattered pulmonary nodules mainly subpleural were noted. Perihilar soft tissue density suggested adenopathy bilaterally as well. The central bronchial tree was also narrowed bilaterally related to either airway thickening or bronchomalacia. Extrinsic compression related to the surrounding adenopathy was also felt to be a component. 800 mL of fluid was withdrawn from the left pleural space. This did confirm metastatic breast cancer At the time of discharge from the hospital the family had wanted to consider repeat thoracenteses if the patient were having respiratory difficulty. They were not aware of the cytology results at that time.  Review of systems: Cannot be completed as the patient is unresponsive.  Physical exam:  Pertinent or positive  findings: The patient does not respond to noxious stimulus. She is on a O2 mask. She exhibits use of the accessory neck & abdominal muscles for respirations. She has coarse rhonchi greater on the left than the right. There is tachyarrhythmia present with a flow murmur. Abdomen is protuberant. Irregular firm mass in the axillary wing of the right breast Posterior tibial pulses are decreased. No significant edema is present.    Lymphatic: No lymphadenopathy about the head, neck,or axilla . Ears:  External ear exam shows no significant lesions or deformities.   Nose:  External nasal examination shows no deformity or inflammation. Nasal mucosa are pink and moist without lesions ,exudates Neck:  No thyromegaly, masses, tenderness noted.    Abdomen:Bowel sounds are decreased. Abdomen is soft and nontender with no organomegaly, hernias,masses. GU: deferred  Skin: Warm & dry w/o tenting. No significant lesions or rash.  See summary under each active problem in the Problem List with associated updated therapeutic plan

## 2017-05-27 NOTE — Assessment & Plan Note (Signed)
Most likely etiology is progression of the malignant pleural effusions. Extrinsic airway compression may be playing a component. Repeat thoracentesis will have no meaningful contribution. Hospice will be consulted  and comfort measures employed. IV morphine and Solu-Medrol will be administered along with nebulized bronchodilators. IV Lasix will be ordered although malignant effusion is not likely to respond to this intervention. I have called the daughter as well as hospice. I will meet with the family and review the CTA imaging if they wish to do so.

## 2017-05-27 NOTE — Patient Instructions (Signed)
See assessment and plan under each diagnosis in the problem list and acutely for this visit. Total time 67 minutes; greater than 50% of the visit spent counseling patient's family and coordinating care for problems addressed at this encounter

## 2017-05-27 NOTE — Assessment & Plan Note (Signed)
Pathophysiology of the effusion (innumerable subpleural malignant nodules) discussed with family. I explained that the hilar lymphadenopathy and extrinsic airway compression more likely major factor in dyspnea. Minimal temporal, if any benefit, from repeat thoracentesis explained At this point comfort care appears to be family goal

## 2017-05-27 NOTE — Assessment & Plan Note (Signed)
Hospice consult underway

## 2017-05-28 DIAGNOSIS — J9600 Acute respiratory failure, unspecified whether with hypoxia or hypercapnia: Secondary | ICD-10-CM | POA: Diagnosis not present

## 2017-05-28 DIAGNOSIS — E46 Unspecified protein-calorie malnutrition: Secondary | ICD-10-CM | POA: Diagnosis not present

## 2017-05-28 DIAGNOSIS — J910 Malignant pleural effusion: Secondary | ICD-10-CM | POA: Diagnosis not present

## 2017-05-28 DIAGNOSIS — I509 Heart failure, unspecified: Secondary | ICD-10-CM | POA: Diagnosis not present

## 2017-05-28 DIAGNOSIS — C50919 Malignant neoplasm of unspecified site of unspecified female breast: Secondary | ICD-10-CM | POA: Diagnosis not present

## 2017-05-28 DIAGNOSIS — C7800 Secondary malignant neoplasm of unspecified lung: Secondary | ICD-10-CM | POA: Diagnosis not present

## 2017-05-30 DIAGNOSIS — I509 Heart failure, unspecified: Secondary | ICD-10-CM | POA: Diagnosis not present

## 2017-05-30 DIAGNOSIS — C50919 Malignant neoplasm of unspecified site of unspecified female breast: Secondary | ICD-10-CM | POA: Diagnosis not present

## 2017-05-30 DIAGNOSIS — E46 Unspecified protein-calorie malnutrition: Secondary | ICD-10-CM | POA: Diagnosis not present

## 2017-05-30 DIAGNOSIS — J910 Malignant pleural effusion: Secondary | ICD-10-CM | POA: Diagnosis not present

## 2017-05-30 DIAGNOSIS — J9600 Acute respiratory failure, unspecified whether with hypoxia or hypercapnia: Secondary | ICD-10-CM | POA: Diagnosis not present

## 2017-05-30 DIAGNOSIS — C7800 Secondary malignant neoplasm of unspecified lung: Secondary | ICD-10-CM | POA: Diagnosis not present

## 2017-05-31 DIAGNOSIS — I509 Heart failure, unspecified: Secondary | ICD-10-CM | POA: Diagnosis not present

## 2017-05-31 DIAGNOSIS — E46 Unspecified protein-calorie malnutrition: Secondary | ICD-10-CM | POA: Diagnosis not present

## 2017-05-31 DIAGNOSIS — J910 Malignant pleural effusion: Secondary | ICD-10-CM | POA: Diagnosis not present

## 2017-05-31 DIAGNOSIS — C7800 Secondary malignant neoplasm of unspecified lung: Secondary | ICD-10-CM | POA: Diagnosis not present

## 2017-05-31 DIAGNOSIS — J9600 Acute respiratory failure, unspecified whether with hypoxia or hypercapnia: Secondary | ICD-10-CM | POA: Diagnosis not present

## 2017-05-31 DIAGNOSIS — C50919 Malignant neoplasm of unspecified site of unspecified female breast: Secondary | ICD-10-CM | POA: Diagnosis not present

## 2017-06-01 DIAGNOSIS — J9600 Acute respiratory failure, unspecified whether with hypoxia or hypercapnia: Secondary | ICD-10-CM | POA: Diagnosis not present

## 2017-06-01 DIAGNOSIS — C50919 Malignant neoplasm of unspecified site of unspecified female breast: Secondary | ICD-10-CM | POA: Diagnosis not present

## 2017-06-01 DIAGNOSIS — C7800 Secondary malignant neoplasm of unspecified lung: Secondary | ICD-10-CM | POA: Diagnosis not present

## 2017-06-01 DIAGNOSIS — E46 Unspecified protein-calorie malnutrition: Secondary | ICD-10-CM | POA: Diagnosis not present

## 2017-06-01 DIAGNOSIS — I509 Heart failure, unspecified: Secondary | ICD-10-CM | POA: Diagnosis not present

## 2017-06-01 DIAGNOSIS — J910 Malignant pleural effusion: Secondary | ICD-10-CM | POA: Diagnosis not present

## 2017-06-02 DIAGNOSIS — E46 Unspecified protein-calorie malnutrition: Secondary | ICD-10-CM | POA: Diagnosis not present

## 2017-06-02 DIAGNOSIS — C7800 Secondary malignant neoplasm of unspecified lung: Secondary | ICD-10-CM | POA: Diagnosis not present

## 2017-06-02 DIAGNOSIS — J910 Malignant pleural effusion: Secondary | ICD-10-CM | POA: Diagnosis not present

## 2017-06-02 DIAGNOSIS — C50919 Malignant neoplasm of unspecified site of unspecified female breast: Secondary | ICD-10-CM | POA: Diagnosis not present

## 2017-06-02 DIAGNOSIS — I509 Heart failure, unspecified: Secondary | ICD-10-CM | POA: Diagnosis not present

## 2017-06-02 DIAGNOSIS — J9600 Acute respiratory failure, unspecified whether with hypoxia or hypercapnia: Secondary | ICD-10-CM | POA: Diagnosis not present

## 2017-06-03 DIAGNOSIS — C50919 Malignant neoplasm of unspecified site of unspecified female breast: Secondary | ICD-10-CM | POA: Diagnosis not present

## 2017-06-03 DIAGNOSIS — I509 Heart failure, unspecified: Secondary | ICD-10-CM | POA: Diagnosis not present

## 2017-06-03 DIAGNOSIS — E46 Unspecified protein-calorie malnutrition: Secondary | ICD-10-CM | POA: Diagnosis not present

## 2017-06-03 DIAGNOSIS — C7800 Secondary malignant neoplasm of unspecified lung: Secondary | ICD-10-CM | POA: Diagnosis not present

## 2017-06-03 DIAGNOSIS — J9600 Acute respiratory failure, unspecified whether with hypoxia or hypercapnia: Secondary | ICD-10-CM | POA: Diagnosis not present

## 2017-06-03 DIAGNOSIS — J910 Malignant pleural effusion: Secondary | ICD-10-CM | POA: Diagnosis not present

## 2017-06-04 ENCOUNTER — Encounter: Payer: Self-pay | Admitting: Adult Health

## 2017-06-04 ENCOUNTER — Non-Acute Institutional Stay (SKILLED_NURSING_FACILITY): Payer: Medicare Other | Admitting: Adult Health

## 2017-06-04 DIAGNOSIS — I10 Essential (primary) hypertension: Secondary | ICD-10-CM | POA: Diagnosis not present

## 2017-06-04 DIAGNOSIS — J9600 Acute respiratory failure, unspecified whether with hypoxia or hypercapnia: Secondary | ICD-10-CM | POA: Diagnosis not present

## 2017-06-04 DIAGNOSIS — C50919 Malignant neoplasm of unspecified site of unspecified female breast: Secondary | ICD-10-CM | POA: Diagnosis not present

## 2017-06-04 DIAGNOSIS — R4189 Other symptoms and signs involving cognitive functions and awareness: Secondary | ICD-10-CM

## 2017-06-04 DIAGNOSIS — R569 Unspecified convulsions: Secondary | ICD-10-CM | POA: Diagnosis not present

## 2017-06-04 DIAGNOSIS — I509 Heart failure, unspecified: Secondary | ICD-10-CM | POA: Diagnosis not present

## 2017-06-04 DIAGNOSIS — E46 Unspecified protein-calorie malnutrition: Secondary | ICD-10-CM | POA: Diagnosis not present

## 2017-06-04 DIAGNOSIS — J910 Malignant pleural effusion: Secondary | ICD-10-CM | POA: Diagnosis not present

## 2017-06-04 DIAGNOSIS — R404 Transient alteration of awareness: Secondary | ICD-10-CM

## 2017-06-04 DIAGNOSIS — C7800 Secondary malignant neoplasm of unspecified lung: Secondary | ICD-10-CM | POA: Diagnosis not present

## 2017-06-04 NOTE — Progress Notes (Signed)
DATE:  06/04/2017   MRN:  [id number]  BIRTHDAY: Dec 27, 1940  Facility:  Nursing Home Location:  [facility] Room Number: 214-A  LEVEL OF CARE:  SNF (31)  Contact Information    Name Relation Home Work [place] Daughter [phone]  [phone]       Code Status History    Date Active Date Inactive Code Status Order ID Comments User Context   05/19/2017 12:56 PM 05/20/2017  8:36 PM DNR [id number]  Melton Alar, PA-C Inpatient   05/15/2017  2:59 AM 05/19/2017 12:56 PM Full Code [id number]  Edwin Dada, MD ED   03/30/2016  3:08 PM 04/03/2016  6:54 PM Full Code [id number]  Radene Gunning, NP ED    Questions for Most Recent Historical Code Status (Order [id number])    Question Answer Comment   In the event of cardiac or respiratory ARREST Do not call a "code blue"    In the event of cardiac or respiratory ARREST Do not perform Intubation, CPR, defibrillation or ACLS    In the event of cardiac or respiratory ARREST Use medication by any route, position, wound care, and other measures to relive pain and suffering. May use oxygen, suction and manual treatment of airway obstruction as needed for comfort.         Advance Directive Documentation     Most Recent Value  Type of Advance Directive  Out of facility DNR (pink MOST or yellow form)  Pre-existing out of facility DNR order (yellow form or pink MOST form)  -  "MOST" Form in Place?  -       Chief Complaint  Patient presents with  . Acute Visit    Change in level of consciousness    HISTORY OF PRESENT ILLNESS:  This is a 39-YO female seen for an acute visit due to change in level of consciousness. She is being followed-up by hospice and a long-term resident of [facility]. She was seen in the room today. She tries to open her eyes upon verbal stimulation then would close them. She does not move at all. O2 @ 3/L min via [place] continuously. No noted SOB.  Charge nurse reported that she was gradually declining in her food intake and not able to eat X 2 days now.  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   PAST MEDICAL HISTORY:  Past Medical History:  Diagnosis Date  . Alzheimer disease   . 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 CAD (coronary artery disease)   . CORONARY ARTERY DISEASE 02/14/2007  . CVA 02/10/2008  . DEMENTIA 01/20/2010  . Edema   . ENDOCARDITIS 09/29/2010  . GERD (gastroesophageal reflux disease)   . Glaucoma   . Gout   . HYPERLIPIDEMIA 02/14/2007  . HYPERTENSION 02/14/2007  . Lack of coordination   . Morbid obesity ([place])   . Myocardial infarct ([place])   . MYOCARDIAL INFARCTION, HX OF 02/14/2007  . OBESITY 01/16/2009  . OBSTRUCTIVE SLEEP APNEA 05/27/2007  . 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 Postmenopausal bleeding   . Unspecified urinary incontinence      CURRENT MEDICATIONS: Reviewed  Patient's Medications  New Prescriptions   No medications on file  Previous Medications   IPRATROPIUM-ALBUTEROL (DUONEB) 0.5-2.5 (3) MG/3ML SOLN    Take 3 mLs by nebulization  every 4 (four) hours as needed (Dyspnea).    LATANOPROST (XALATAN) 0.005 % OPHTHALMIC SOLUTION    Place 1 drop into both eyes at bedtime.   MORPHINE 10 MG/5ML SOLUTION    Place 2.5 mg under the tongue every 3 (three) hours as needed for severe pain.   MORPHINE SULFATE (MORPHINE CONCENTRATE) 10 MG/0.5ML SOLN CONCENTRATED SOLUTION    Place 0.25 mLs (5 mg total) under the tongue every 4 (four) hours as needed for severe pain or shortness of breath (For shortness of breath or increased work of breathing.).  Modified Medications   No medications on file  Discontinued Medications   ACETAMINOPHEN (TYLENOL) 325 MG TABLET    Take 2 tablets (650 mg total) by mouth every 6 (six) hours as needed for mild pain (or  Fever >/= 101).   ASPIRIN ([employer] CHILDRENS ASPIRIN) 81 MG CHEWABLE TABLET    TAKE 1 TABLET (81 MG TOTAL) BY MOUTH DAILY.   ATORVASTATIN (LIPITOR) 40 MG TABLET    Take 1 tablet (40 mg total) by mouth daily.   CALCIUM-VITAMIN D 600-200 MG-UNIT TABLET    Take 1 tablet by mouth daily.   CARVEDILOL (COREG) 25 MG TABLET    TAKE 1 TABLET BY MOUTH TWICE A DAY WITH A MEAL   DONEPEZIL (ARICEPT) 10 MG TABLET    Take 1 tablet (10 mg total) by mouth at bedtime.   FERROUS SULFATE 325 (65 FE) MG TABLET    Take 325 mg by mouth daily with breakfast.   FUROSEMIDE (LASIX) 40 MG TABLET    Take 1 tablet (40 mg total) by mouth daily.   LEVETIRACETAM (KEPPRA) 500 MG TABLET    Take 1 tablet (500 mg total) by mouth 2 (two) times daily.   LISINOPRIL (PRINIVIL,ZESTRIL) 40 MG TABLET    Take 1 tablet (40 mg total) by mouth daily.   MELATONIN 3 MG TABS    Take 1 tablet by mouth at bedtime.   MEMANTINE (NAMENDA XR) 28 MG CP24 24 HR CAPSULE    Take 28 mg by mouth daily.   POLYETHYLENE GLYCOL (MIRALAX / GLYCOLAX) PACKET    Take 17 g by mouth daily.   SERTRALINE (ZOLOFT) 50 MG TABLET    Take 50 mg by mouth at bedtime.     No Known Allergies   REVIEW OF SYSTEMS:  Unable to obtain due to declining condition    PHYSICAL EXAMINATION  GENERAL APPEARANCE:  In no acute distress. Obese SKIN:  Skin is warm and dry.  HEAD: Normal in size and contour. No evidence of trauma RESPIRATORY: breathing is even & unlabored, +crackles on right lung fields, O2 @ 3L/min via [place] continuously CARDIAC: RRR, no murmur,no extra heart sounds, no edema GI: abdomen soft, normal BS, no masses EXTREMITIES:  No movement X 4 extremities NEURO: stuporous, has left facial droop     LABS/RADIOLOGY: Labs reviewed: Basic Metabolic Panel:  Recent Labs  05/17/17 0507 05/19/17 0717 05/20/17 0440  NA 143 142 143  K 4.0 3.4* 3.9  CL 101 101 104  CO2 31 28 27   GLUCOSE 135* 109* 116*  BUN 25* 34* 33*  CREATININE 1.02* 1.07* 0.97  CALCIUM 9.3  8.7* 8.8*   Liver Function Tests:  Recent Labs  06/28/16 10/29/16 05/15/17 0130  AST 12* 14 24  ALT 10 11 12*  ALKPHOS 70 84 71  BILITOT  --   --  1.0  PROT  --   --  5.5*  ALBUMIN  --   --  2.6*   CBC:  Recent Labs  05/14/17 2222 05/16/17 0519 05/19/17 0504 05/20/17 0440  WBC 7.0 6.5 6.5 8.5  NEUTROABS 4.7  --   --   --   HGB 13.2 12.2 12.6 13.9  HCT 42.2 39.6 40.7 43.5  MCV 92.7 92.5 91.3 91.4  PLT 190 177 222 213   Lipid Panel:  Recent Labs  06/28/16  HDL 42   Cardiac Enzymes:  Recent Labs  05/15/17 0130  TROPONINI <0.03    Dg Chest 1 View  Result Date: 05/18/2017 CLINICAL DATA:  Status post left thoracentesis. EXAM: CHEST 1 VIEW COMPARISON:  05/14/2017 FINDINGS: There is moderate cardiac enlargement. Aortic atherosclerosis noted. There has been interval decrease in volume of left pleural effusion status post thoracentesis. No pneumothorax visualized. Areas of atelectasis within the left midlung and left base noted. IMPRESSION: 1. No pneumothorax following left-sided thoracentesis. Electronically Signed   By: Kerby Moors M.D.   On: 05/18/2017 16:05   Dg Chest 2 View  Result Date: 05/14/2017 CLINICAL DATA:  Pneumonia, hypertension EXAM: CHEST  2 VIEW COMPARISON:  09/16/2015 FINDINGS: Bilateral pleural effusions, left greater than right. Bilateral lower lung atelectasis or pneumonia. There is cardiomegaly with central congestion and moderate perihilar edema. No pneumothorax. IMPRESSION: 1. Cardiomegaly with vascular congestion and moderate perihilar edema 2. Left greater than right pleural effusions with dense bibasilar atelectasis or pneumonia. Electronically Signed   By: Donavan Foil M.D.   On: 05/14/2017 22:38   Ct Angio Chest Pe W Or Wo Contrast  Result Date: 05/17/2017 CLINICAL DATA:  Hypoxia and shortness of breath. EXAM: CT ANGIOGRAPHY CHEST WITH CONTRAST TECHNIQUE: Multidetector CT imaging of the chest was performed using the standard protocol during  bolus administration of intravenous contrast. Multiplanar CT image reconstructions and MIPs were obtained to evaluate the vascular anatomy. CONTRAST:  100 cc Isovue 370 COMPARISON:  Chest radiograph 05/14/2017 FINDINGS: Cardiovascular: No filling defect is identified in the pulmonary arterial tree to suggest pulmonary embolus. Coronary, aortic arch, and branch vessel atherosclerotic vascular disease. Small pericardial effusion. Mediastinum/Nodes: There is right axillary adenopathy, with 1 right axillary mass measuring 3.5 by 4.2 cm on image 76/7. Skin thickening in the right breast with suspected subareolar mass, image 134/7. Edema in the right breast. Right supraclavicular node 1.4 cm in short axis on image 13/7. Right hilar and suprahilar adenopathy is confluent and extends into the mediastinum, surrounding the rib right mainstem bronchus and right central tracheobronchial tree. Because this is so confluent, it is difficult to measure, but adenopathy between the right mainstem bronchus and the IVC measures 1.5 cm in thickness, and a right hilar node is measures proximally 1.6 cm in thickness. Suspected indistinct subcarinal adenopathy. Lungs/Pleura: Solid pulmonary nodules are scattered in both lungs. Many of these are subpleural in location. An index right lower lobe nodule on image 77/6 measures 1.4 by 0.9 cm. Large bilateral pleural effusions are present, especially on the long the left. Passive atelectasis of most of the left lung. There is some mosaic attenuation in the lungs which may be due to edema. Narrowing of the tracheobronchial tree is present centrally, and a component of bronchomalacia is not excluded. Some of the appearance may be due to wall thickening along the central bronchial tree bilaterally. There is some unusual low-density along the left central tracheobronchial tree, lower in density than the surrounding atelectatic lung, possibly from adenopathy or airspace filling process centrally.  Upper Abdomen: Imaging extensor most but not all of the left hemidiaphragm. Visualized  upper abdominal structures grossly unremarkable aside from atherosclerosis. Musculoskeletal: Considerable thoracic and lower cervical spondylosis. Review of the MIP images confirms the above findings. IMPRESSION: 1. Extensive right axillary adenopathy with suspected right breast mass, appearance concerning for metastatic breast cancer. 2. Large bilateral pleural effusions with innumerable scattered pulmonary nodules, primarily in subpleural locations, and with associated passive atelectasis. Extensive perihilar soft tissue density suggesting adenopathy bilaterally. Overall the tools and thoracic adenopathy are very likely due to metastatic malignancy, less likely to be due to entities such as sarcoidosis. The large pleural effusions may be contributing to the patient's hypoxia. 3. Either airway thickening or bronchomalacia is causing narrowing of the central bronchial tree bilaterally. Extrinsic narrowing from the surrounding adenopathy may also be contributory. 4. Mosaic attenuation in the lungs potentially from mild edema or air trapping. 5.  Aortic Atherosclerosis (ICD10-I70.0).  Coronary atherosclerosis. 6. Small pericardial effusion. These results will be called to the ordering clinician or representative by the Radiologist Assistant, and communication documented in the PACS or zVision Dashboard. Electronically Signed   By: Van Clines M.D.   On: 05/17/2017 17:29   Ir Thoracentesis Asp Pleural Space W/img Guide  Result Date: 05/18/2017 INDICATION: Symptomatic left sided pleural effusion EXAM: IR THORACENTESIS ASP PLEURAL SPACE W/IMG GUIDE COMPARISON:  None. MEDICATIONS: 10 cc 1% lidocaine COMPLICATIONS: None immediate. TECHNIQUE: Informed written consent was obtained from the patient after a discussion of the risks, benefits and alternatives to treatment. A timeout was performed prior to the initiation of the  procedure. Initial ultrasound scanning demonstrates a left pleural effusion. The lower chest was prepped and draped in the usual sterile fashion. 1% lidocaine was used for local anesthesia. Under direct ultrasound guidance, a 19 gauge, 7-cm, Yueh catheter was introduced. An ultrasound image was saved for documentation purposes. The thoracentesis was performed. The catheter was removed and a dressing was applied. The patient tolerated the procedure well without immediate post procedural complication. The patient was escorted to have an upright chest radiograph. FINDINGS: A total of approximately 800 cc of dark yellow fluid was removed. Requested samples were sent to the laboratory. IMPRESSION: Successful ultrasound-guided left sided thoracentesis yielding 800 cc of pleural fluid. Read by Lavonia Drafts [facility] Electronically Signed   By: Corrie Mckusick D.O.   On: 05/18/2017 15:53    ASSESSMENT/PLAN:  1. Changes in consciousness Patient is currently stuporous and unable to eat nor swallow, will continue to keep patient comfortable/hospice  2. Metastatic breast cancer ([place])  Will continue to keep patient comfortable/hospice, continue Morphine 10 mg/0.5 ml give 0.25 ml/5 mg SL Q 3 hours PRN  3. Essential hypertension Discontinue Coreg, Lisinopril and Lasix  4. Seizure ([place]) Discontinue Keppra       Madline Oesterling C. Corwith - NP    [employer] [phone]

## 2017-06-05 DIAGNOSIS — C50919 Malignant neoplasm of unspecified site of unspecified female breast: Secondary | ICD-10-CM | POA: Diagnosis not present

## 2017-06-05 DIAGNOSIS — J910 Malignant pleural effusion: Secondary | ICD-10-CM | POA: Diagnosis not present

## 2017-06-05 DIAGNOSIS — E46 Unspecified protein-calorie malnutrition: Secondary | ICD-10-CM | POA: Diagnosis not present

## 2017-06-05 DIAGNOSIS — I509 Heart failure, unspecified: Secondary | ICD-10-CM | POA: Diagnosis not present

## 2017-06-05 DIAGNOSIS — J9600 Acute respiratory failure, unspecified whether with hypoxia or hypercapnia: Secondary | ICD-10-CM | POA: Diagnosis not present

## 2017-06-05 DIAGNOSIS — C7800 Secondary malignant neoplasm of unspecified lung: Secondary | ICD-10-CM | POA: Diagnosis not present

## 2017-06-06 DIAGNOSIS — I679 Cerebrovascular disease, unspecified: Secondary | ICD-10-CM | POA: Diagnosis not present

## 2017-06-06 DIAGNOSIS — J910 Malignant pleural effusion: Secondary | ICD-10-CM | POA: Diagnosis not present

## 2017-06-06 DIAGNOSIS — C7800 Secondary malignant neoplasm of unspecified lung: Secondary | ICD-10-CM | POA: Diagnosis not present

## 2017-06-06 DIAGNOSIS — F0150 Vascular dementia without behavioral disturbance: Secondary | ICD-10-CM | POA: Diagnosis not present

## 2017-06-06 DIAGNOSIS — C50919 Malignant neoplasm of unspecified site of unspecified female breast: Secondary | ICD-10-CM | POA: Diagnosis not present

## 2017-06-06 DIAGNOSIS — J9600 Acute respiratory failure, unspecified whether with hypoxia or hypercapnia: Secondary | ICD-10-CM | POA: Diagnosis not present

## 2017-06-06 DIAGNOSIS — R569 Unspecified convulsions: Secondary | ICD-10-CM | POA: Diagnosis not present

## 2017-06-06 DIAGNOSIS — E46 Unspecified protein-calorie malnutrition: Secondary | ICD-10-CM | POA: Diagnosis not present

## 2017-06-06 DIAGNOSIS — I509 Heart failure, unspecified: Secondary | ICD-10-CM | POA: Diagnosis not present

## 2017-06-06 DIAGNOSIS — G9340 Encephalopathy, unspecified: Secondary | ICD-10-CM | POA: Diagnosis not present

## 2017-06-06 DIAGNOSIS — I10 Essential (primary) hypertension: Secondary | ICD-10-CM | POA: Diagnosis not present

## 2017-06-07 DIAGNOSIS — C50919 Malignant neoplasm of unspecified site of unspecified female breast: Secondary | ICD-10-CM | POA: Diagnosis not present

## 2017-06-07 DIAGNOSIS — J910 Malignant pleural effusion: Secondary | ICD-10-CM | POA: Diagnosis not present

## 2017-06-07 DIAGNOSIS — C7800 Secondary malignant neoplasm of unspecified lung: Secondary | ICD-10-CM | POA: Diagnosis not present

## 2017-06-07 DIAGNOSIS — J9600 Acute respiratory failure, unspecified whether with hypoxia or hypercapnia: Secondary | ICD-10-CM | POA: Diagnosis not present

## 2017-06-07 DIAGNOSIS — I509 Heart failure, unspecified: Secondary | ICD-10-CM | POA: Diagnosis not present

## 2017-06-07 DIAGNOSIS — E46 Unspecified protein-calorie malnutrition: Secondary | ICD-10-CM | POA: Diagnosis not present

## 2017-06-08 DIAGNOSIS — I509 Heart failure, unspecified: Secondary | ICD-10-CM | POA: Diagnosis not present

## 2017-06-08 DIAGNOSIS — C50919 Malignant neoplasm of unspecified site of unspecified female breast: Secondary | ICD-10-CM | POA: Diagnosis not present

## 2017-06-08 DIAGNOSIS — J9600 Acute respiratory failure, unspecified whether with hypoxia or hypercapnia: Secondary | ICD-10-CM | POA: Diagnosis not present

## 2017-06-08 DIAGNOSIS — E46 Unspecified protein-calorie malnutrition: Secondary | ICD-10-CM | POA: Diagnosis not present

## 2017-06-08 DIAGNOSIS — J910 Malignant pleural effusion: Secondary | ICD-10-CM | POA: Diagnosis not present

## 2017-06-08 DIAGNOSIS — C7800 Secondary malignant neoplasm of unspecified lung: Secondary | ICD-10-CM | POA: Diagnosis not present

## 2017-07-07 DEATH — deceased

## 2018-12-09 IMAGING — DX DG CHEST 2V
2 series · 2 of 2 positions shown · non-contrast
Comparison: 09/16/2015

CLINICAL DATA: Pneumonia, hypertension

EXAM:
CHEST  2 VIEW

[chest lat]
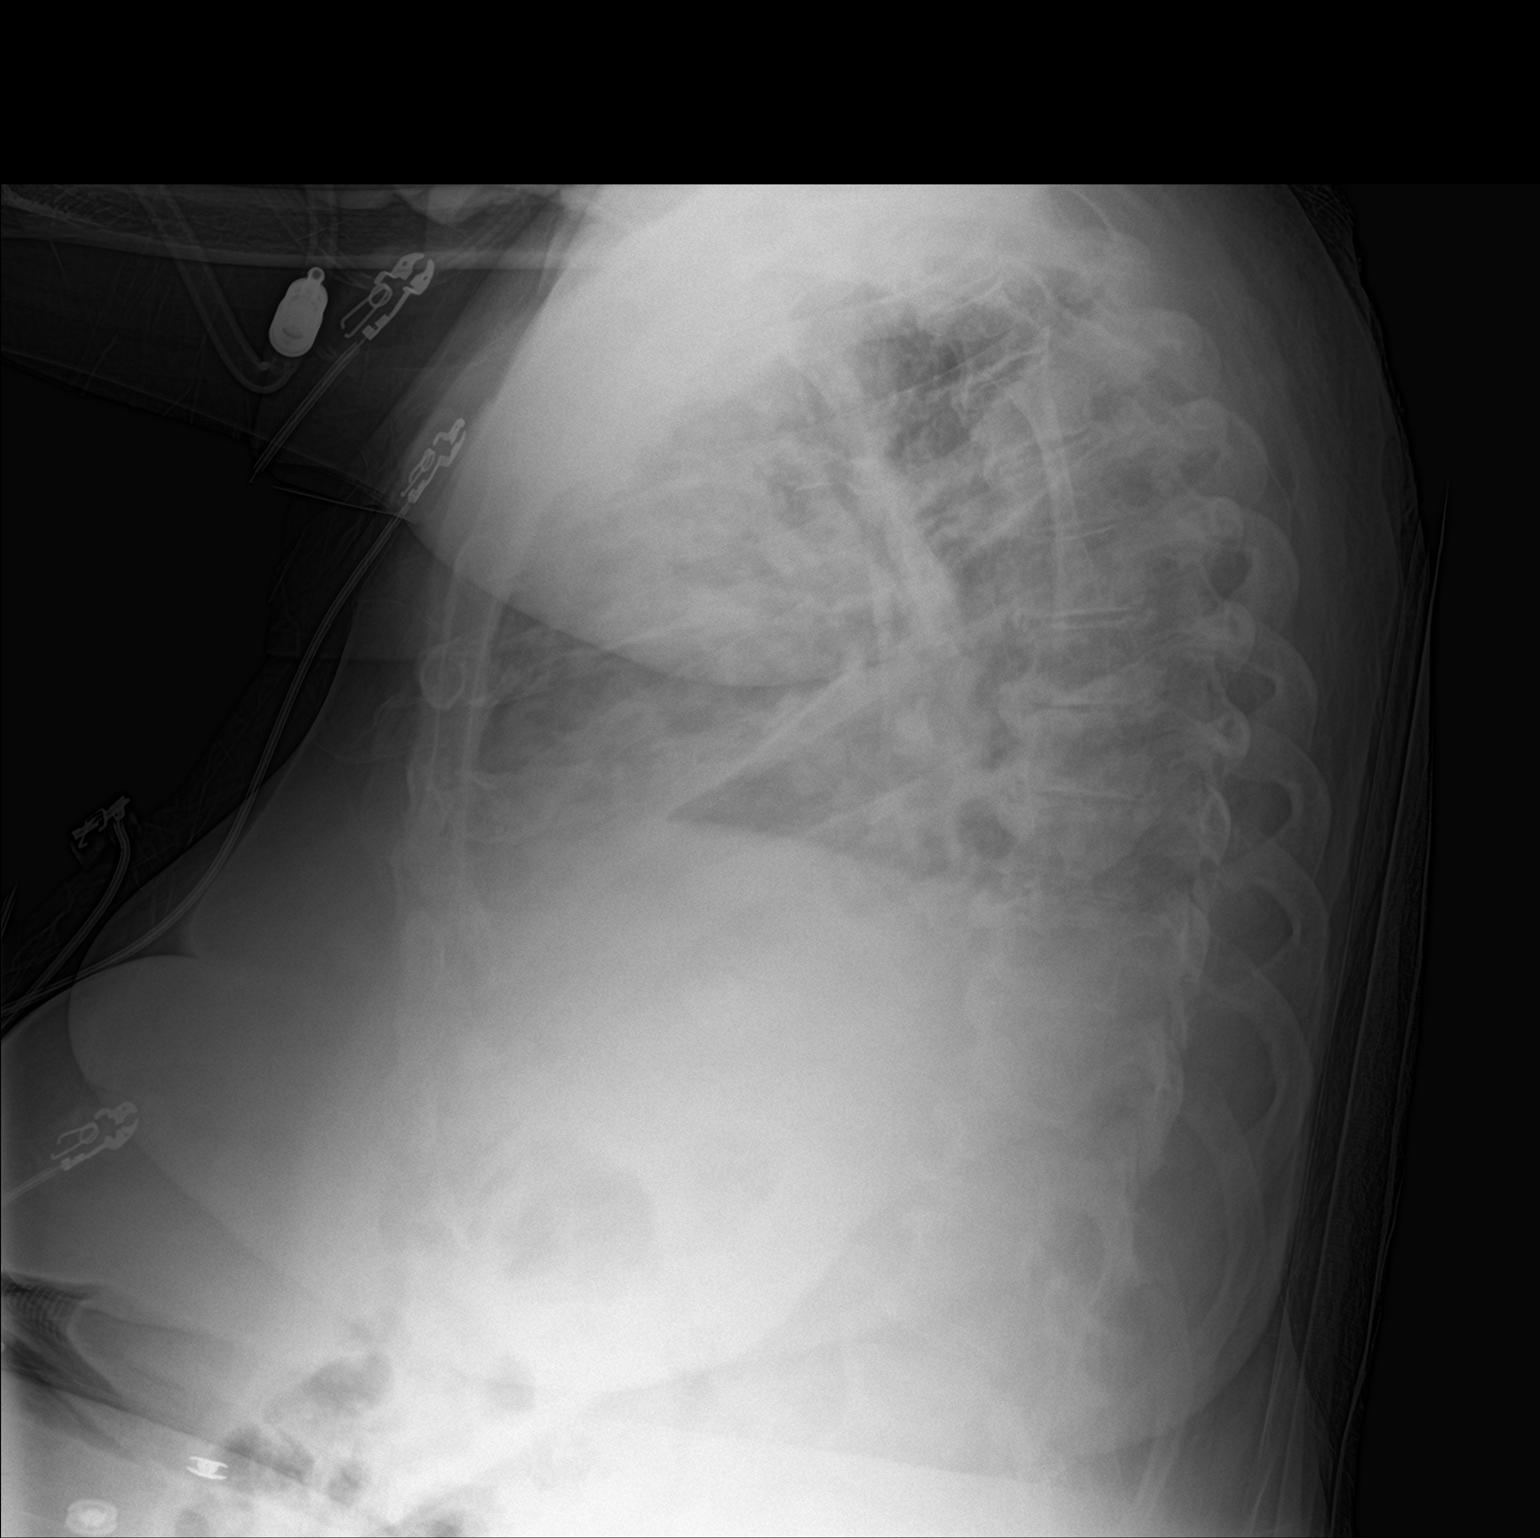

[chest ap]
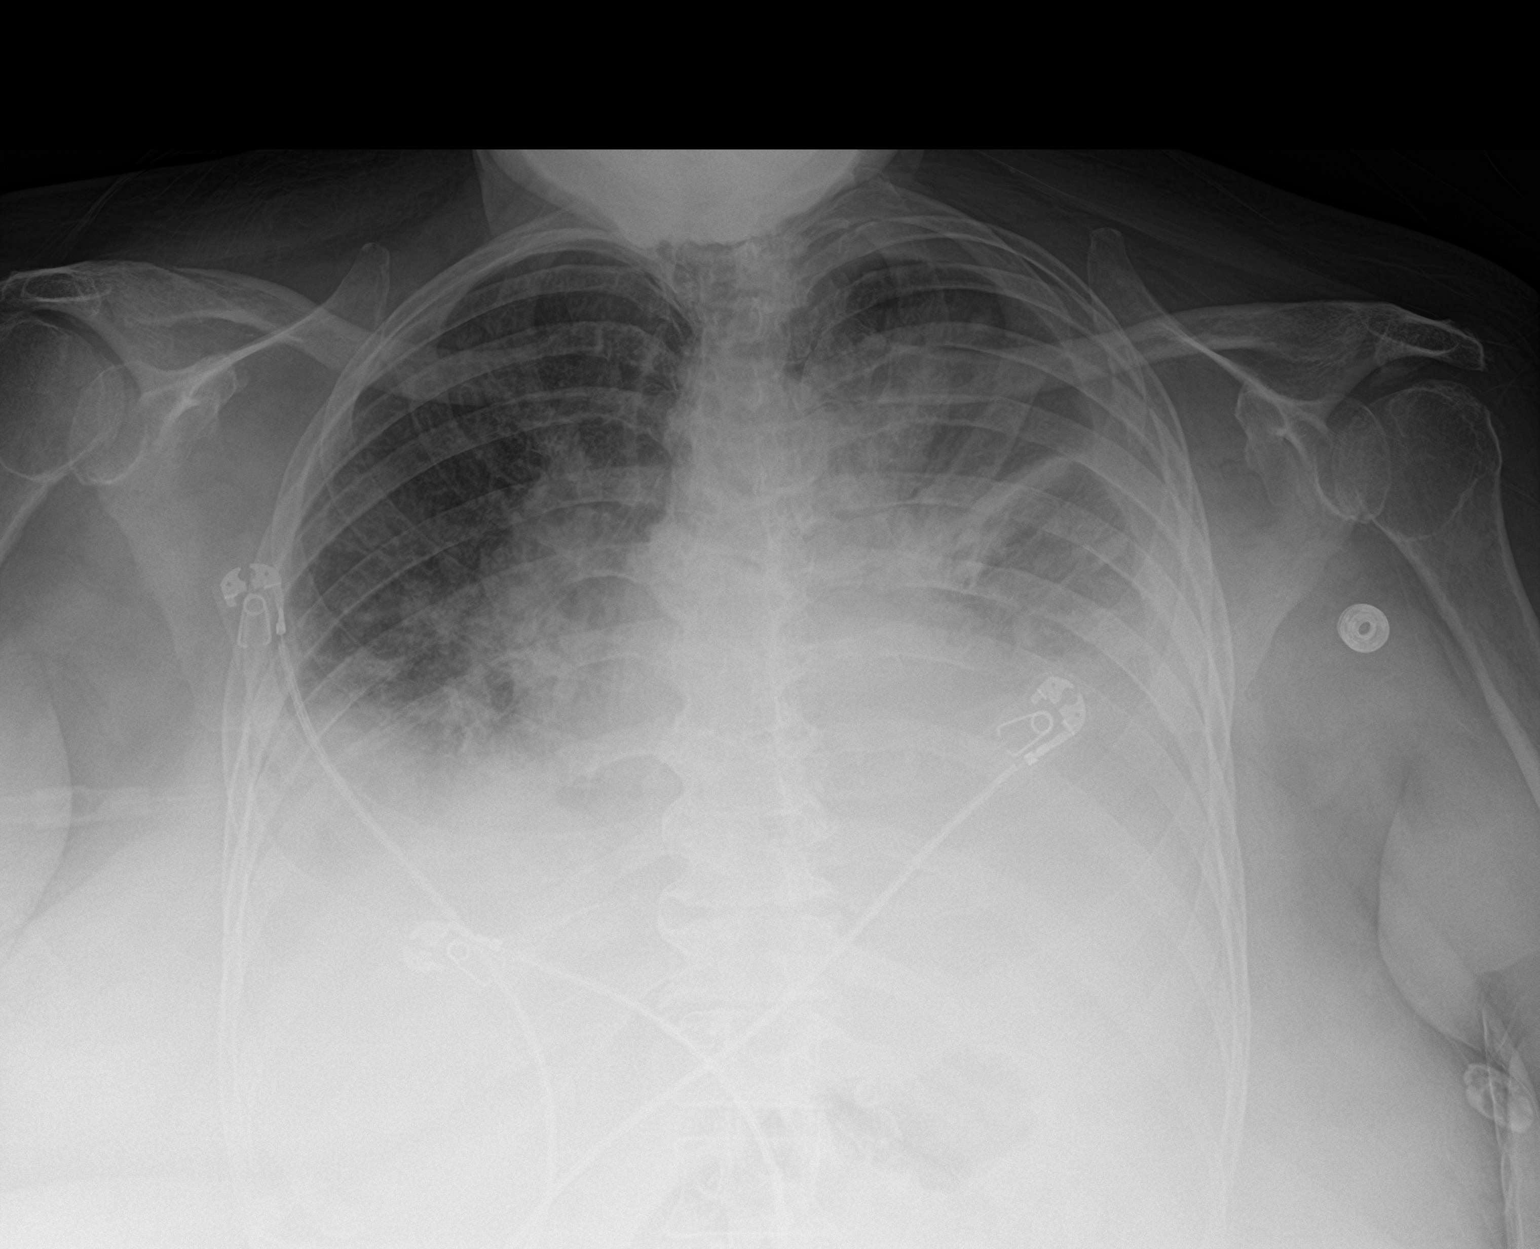

[2 of 2 positions shown; findings below may reference images not displayed]

FINDINGS: Bilateral pleural effusions, left greater than right. Bilateral
lower lung atelectasis or pneumonia. There is cardiomegaly with
central congestion and moderate perihilar edema. No pneumothorax.
IMPRESSION: 1. Cardiomegaly with vascular congestion and moderate perihilar
edema
2. Left greater than right pleural effusions with dense bibasilar
atelectasis or pneumonia.

## 2018-12-12 IMAGING — CT CT ANGIO CHEST
2 of 6 series · 17 of 36 positions shown · IV contrast (Omni 300)
Comparison: Chest radiograph 05/14/2017

CLINICAL DATA: Hypoxia and shortness of breath.

EXAM:
CT ANGIOGRAPHY CHEST WITH CONTRAST
TECHNIQUE: Multidetector CT imaging of the chest was performed using the
standard protocol during bolus administration of intravenous
contrast. Multiplanar CT image reconstructions and MIPs were
obtained to evaluate the vascular anatomy.
CONTRAST:  100 cc Isovue 370

[Series 7: pe thins · axial · 0.70mm/px · z∈[+1242,+1466]mm · 16 of 252 slices shown]
[im 14/252  lung]
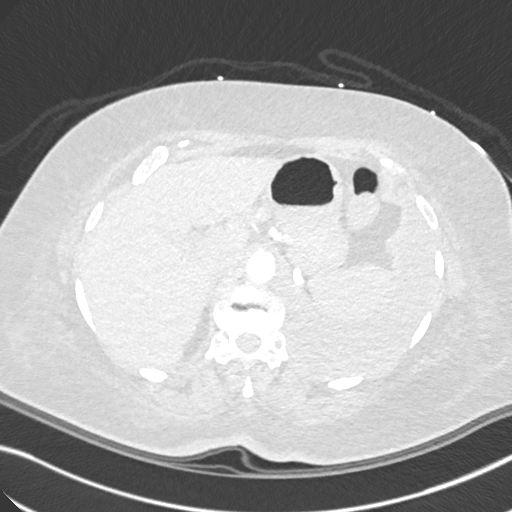
[im 28/252  mediastinal]
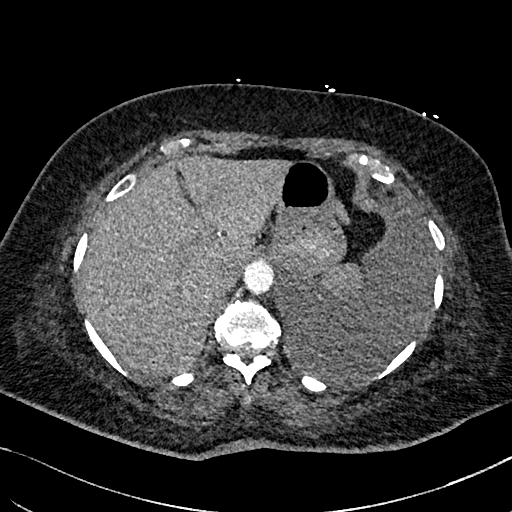
[im 42/252  lung]
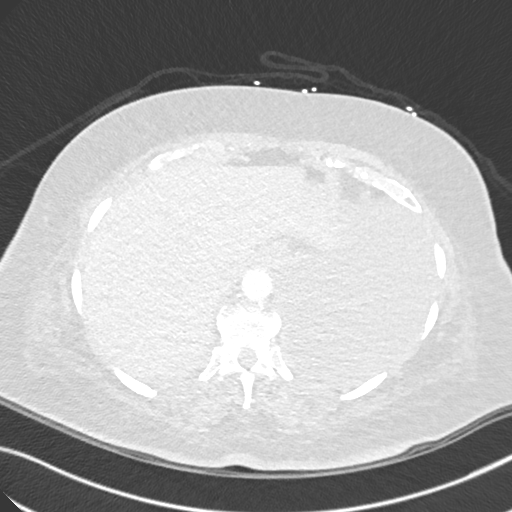
[im 56/252  mediastinal]
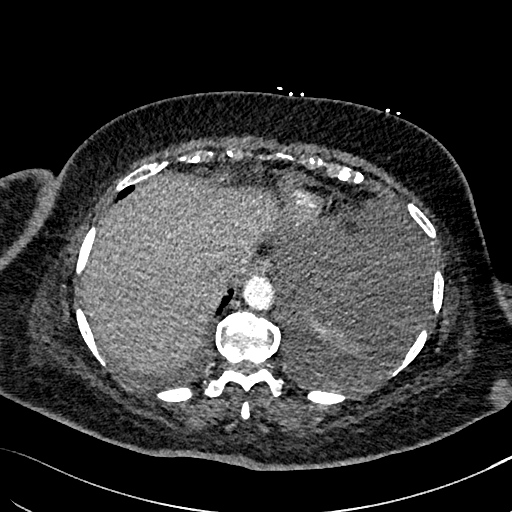
[im 70/252  lung]
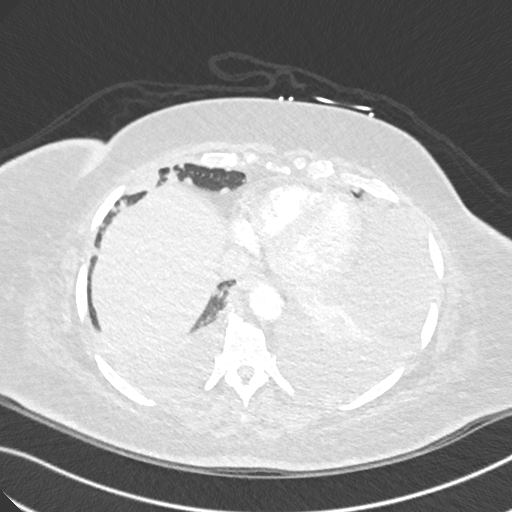
[im 84/252  mediastinal]
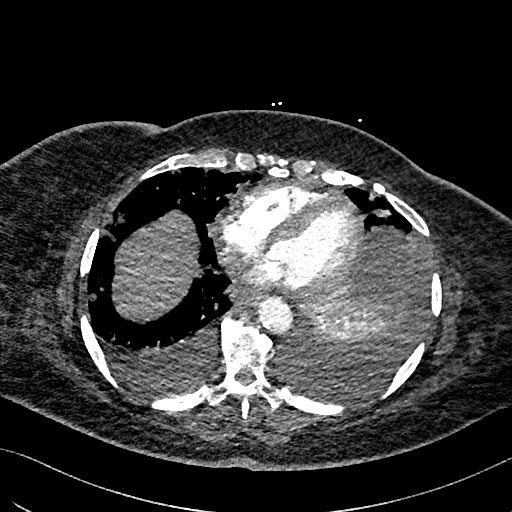
[im 98/252  lung]
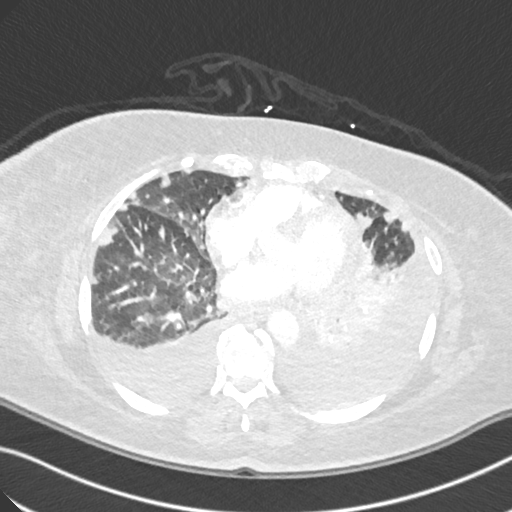
[im 112/252  mediastinal]
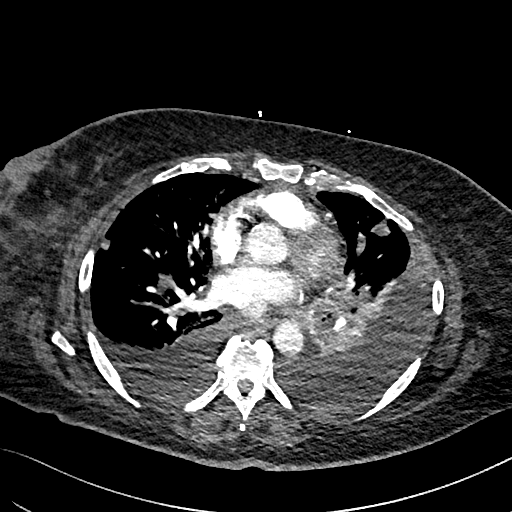
[im 140/252  lung]
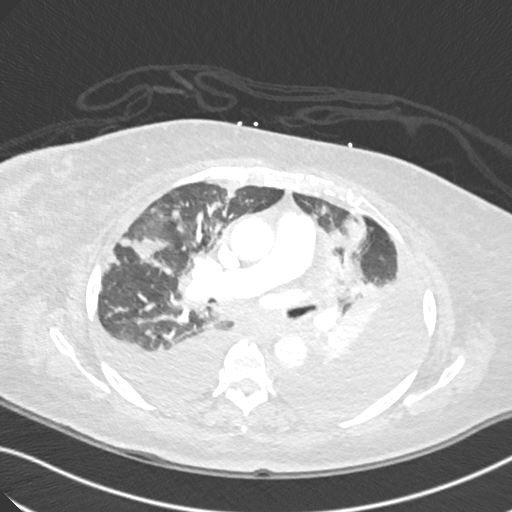
[im 154/252  mediastinal]
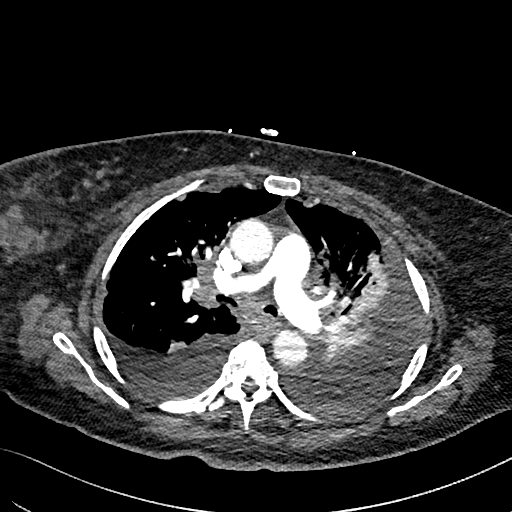
[im 168/252  lung]
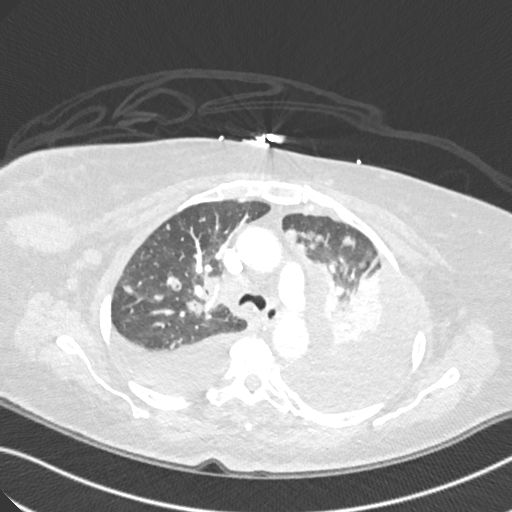
[im 182/252  mediastinal]
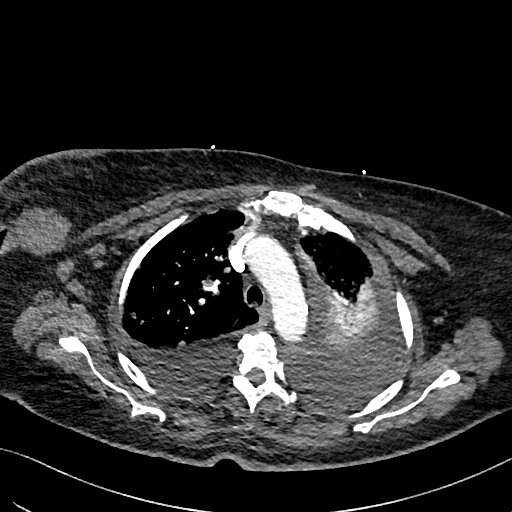
[im 196/252  lung]
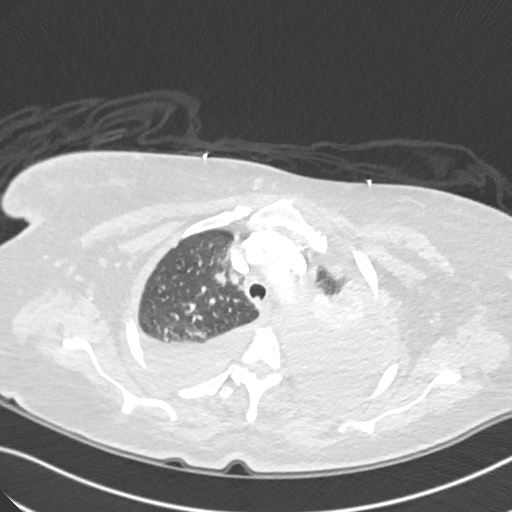
[im 210/252  mediastinal]
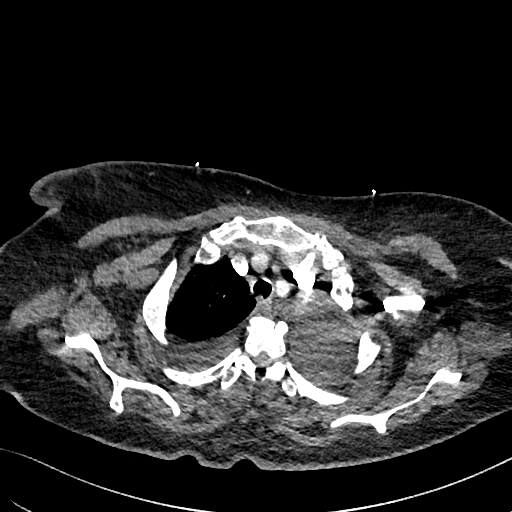
[im 224/252  lung]
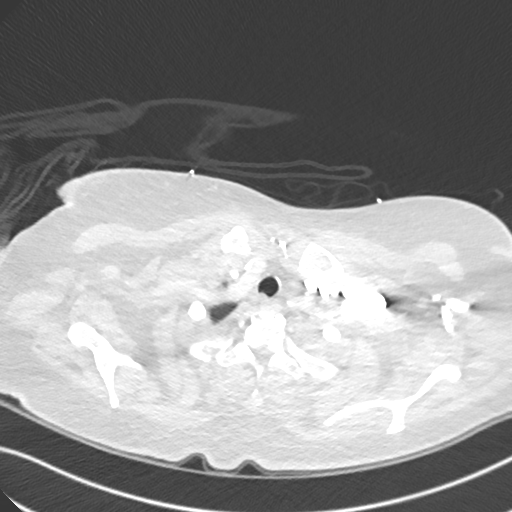
[im 238/252  mediastinal]
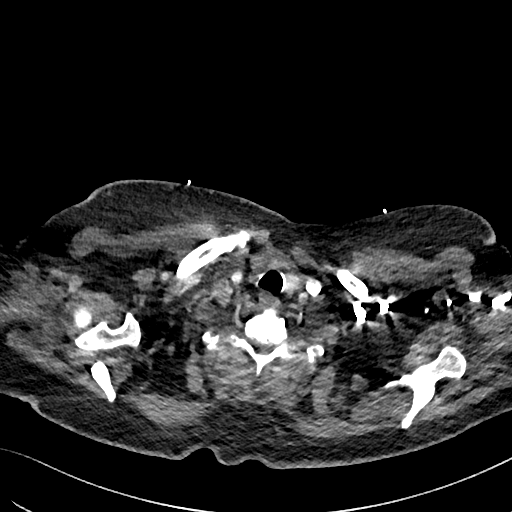

[Series 8: pe 2mm cor · coronal · 0.49mm/px · 1 of 135 slices shown]
[im 68/135  mediastinal]
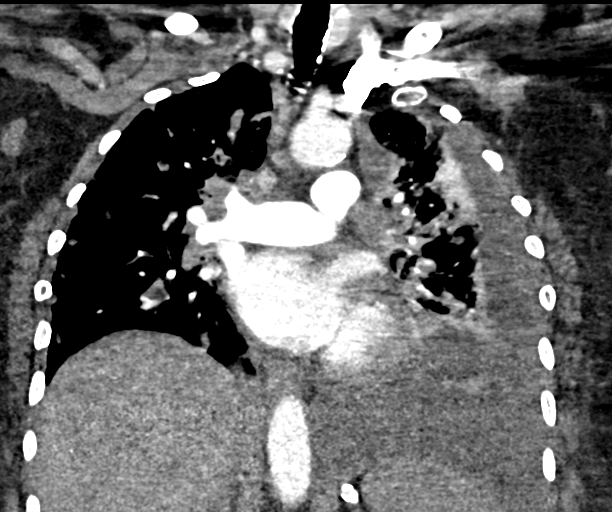

[17 of 36 positions shown; findings below may reference images not displayed]

FINDINGS: Cardiovascular: No filling defect is identified in the pulmonary
arterial tree to suggest pulmonary embolus. Coronary, aortic arch,
and branch vessel atherosclerotic vascular disease. Small
pericardial effusion.

Mediastinum/Nodes: There is right axillary adenopathy, with 1 right
axillary mass measuring 3.5 by 4.2 cm on image 76/7. Skin thickening
in the right breast with suspected subareolar mass, image 134/7.
Edema in the right breast. Right supraclavicular node 1.4 cm in
short axis on image [DATE].

Right hilar and suprahilar adenopathy is confluent and extends into
the mediastinum, surrounding the rib right mainstem bronchus and
right central tracheobronchial tree. Because this is so confluent,
it is difficult to measure, but adenopathy between the right
mainstem bronchus and the IVC measures 1.5 cm in thickness, and a
right hilar node is measures proximally 1.6 cm in thickness.
Suspected indistinct subcarinal adenopathy.

Lungs/Pleura: Solid pulmonary nodules are scattered in both lungs.
Many of these are subpleural in location. An index right lower lobe
nodule on image 77/6 measures 1.4 by 0.9 cm.

Large bilateral pleural effusions are present, especially on the
long the left. Passive atelectasis of most of the left lung. There
is some mosaic attenuation in the lungs which may be due to edema.
Narrowing of the tracheobronchial tree is present centrally, and a
component of bronchomalacia is not excluded. Some of the appearance
may be due to wall thickening along the central bronchial tree
bilaterally. There is some unusual low-density along the left
central tracheobronchial tree, lower in density than the surrounding
atelectatic lung, possibly from adenopathy or airspace filling
process centrally.

Upper Abdomen: Imaging extensor most but not all of the left
hemidiaphragm. Visualized upper abdominal structures grossly
unremarkable aside from atherosclerosis.

Musculoskeletal: Considerable thoracic and lower cervical
spondylosis.

Review of the MIP images confirms the above findings.
IMPRESSION: 1. Extensive right axillary adenopathy with suspected right breast
mass, appearance concerning for metastatic breast cancer.
2. Large bilateral pleural effusions with innumerable scattered
pulmonary nodules, primarily in subpleural locations, and with
associated passive atelectasis. Extensive perihilar soft tissue
density suggesting adenopathy bilaterally. Overall the tools and
thoracic adenopathy are very likely due to metastatic malignancy,
less likely to be due to entities such as sarcoidosis. The large
pleural effusions may be contributing to the patient's hypoxia.
3. Either airway thickening or bronchomalacia is causing narrowing
of the central bronchial tree bilaterally. Extrinsic narrowing from
the surrounding adenopathy may also be contributory.
4. Mosaic attenuation in the lungs potentially from mild edema or
air trapping.
5.  Aortic Atherosclerosis ([id number]).  Coronary atherosclerosis.
6. Small pericardial effusion.
These results will be called to the ordering clinician or
representative by the Radiologist Assistant, and communication
documented in the PACS or zVision Dashboard.
